# Patient Record
Sex: Female | Born: 1935 | Race: White | Hispanic: No | Smoking: Former smoker
Health system: Southern US, Community
[De-identification: ages and names within clinical notes are randomized; demographics above are authoritative.]

## PROBLEM LIST (undated history)

## (undated) DIAGNOSIS — F0390 Unspecified dementia without behavioral disturbance: Secondary | ICD-10-CM

## (undated) DIAGNOSIS — I119 Hypertensive heart disease without heart failure: Secondary | ICD-10-CM

## (undated) DIAGNOSIS — I2510 Atherosclerotic heart disease of native coronary artery without angina pectoris: Secondary | ICD-10-CM

## (undated) DIAGNOSIS — I739 Peripheral vascular disease, unspecified: Secondary | ICD-10-CM

## (undated) DIAGNOSIS — K5900 Constipation, unspecified: Secondary | ICD-10-CM

## (undated) DIAGNOSIS — K219 Gastro-esophageal reflux disease without esophagitis: Secondary | ICD-10-CM

## (undated) DIAGNOSIS — G629 Polyneuropathy, unspecified: Secondary | ICD-10-CM

## (undated) DIAGNOSIS — I5032 Chronic diastolic (congestive) heart failure: Secondary | ICD-10-CM

## (undated) DIAGNOSIS — E785 Hyperlipidemia, unspecified: Secondary | ICD-10-CM

## (undated) DIAGNOSIS — I359 Nonrheumatic aortic valve disorder, unspecified: Secondary | ICD-10-CM

## (undated) DIAGNOSIS — E119 Type 2 diabetes mellitus without complications: Secondary | ICD-10-CM

## (undated) HISTORY — PX: CORONARY ARTERY BYPASS GRAFT: SHX141

## (undated) HISTORY — PX: BREAST ENHANCEMENT SURGERY: SHX7

## (undated) HISTORY — PX: TONSILLECTOMY: SUR1361

## (undated) HISTORY — PX: OTHER SURGICAL HISTORY: SHX169

---

## 1998-01-02 ENCOUNTER — Inpatient Hospital Stay (HOSPITAL_COMMUNITY): Admission: RE | Admit: 1998-01-02 | Discharge: 1998-01-07 | Payer: Self-pay | Admitting: Cardiology

## 1998-02-09 ENCOUNTER — Encounter (HOSPITAL_COMMUNITY): Admission: RE | Admit: 1998-02-09 | Discharge: 1998-05-10 | Payer: Self-pay | Admitting: Cardiology

## 1998-04-21 ENCOUNTER — Encounter: Payer: Self-pay | Admitting: *Deleted

## 1998-04-21 ENCOUNTER — Inpatient Hospital Stay (HOSPITAL_COMMUNITY): Admission: RE | Admit: 1998-04-21 | Discharge: 1998-04-25 | Payer: Self-pay | Admitting: *Deleted

## 1998-05-25 ENCOUNTER — Encounter (HOSPITAL_COMMUNITY): Admission: RE | Admit: 1998-05-25 | Discharge: 1998-08-23 | Payer: Self-pay | Admitting: Cardiology

## 1999-02-01 ENCOUNTER — Other Ambulatory Visit: Admission: RE | Admit: 1999-02-01 | Discharge: 1999-02-01 | Payer: Self-pay | Admitting: Obstetrics and Gynecology

## 1999-05-23 HISTORY — PX: LAPAROSCOPIC CHOLECYSTECTOMY: SUR755

## 1999-08-20 ENCOUNTER — Ambulatory Visit (HOSPITAL_COMMUNITY): Admission: RE | Admit: 1999-08-20 | Discharge: 1999-08-20 | Payer: Self-pay | Admitting: Neurosurgery

## 1999-08-20 ENCOUNTER — Encounter: Payer: Self-pay | Admitting: Neurosurgery

## 1999-12-16 ENCOUNTER — Encounter: Payer: Self-pay | Admitting: Emergency Medicine

## 1999-12-16 ENCOUNTER — Encounter (INDEPENDENT_AMBULATORY_CARE_PROVIDER_SITE_OTHER): Payer: Self-pay | Admitting: *Deleted

## 1999-12-16 ENCOUNTER — Inpatient Hospital Stay (HOSPITAL_COMMUNITY): Admission: EM | Admit: 1999-12-16 | Discharge: 1999-12-23 | Payer: Self-pay | Admitting: Emergency Medicine

## 1999-12-21 ENCOUNTER — Encounter: Payer: Self-pay | Admitting: General Surgery

## 1999-12-26 ENCOUNTER — Encounter: Payer: Self-pay | Admitting: Surgery

## 1999-12-26 ENCOUNTER — Emergency Department (HOSPITAL_COMMUNITY): Admission: EM | Admit: 1999-12-26 | Discharge: 1999-12-26 | Payer: Self-pay | Admitting: Emergency Medicine

## 1999-12-29 ENCOUNTER — Encounter: Payer: Self-pay | Admitting: General Surgery

## 1999-12-29 ENCOUNTER — Ambulatory Visit (HOSPITAL_COMMUNITY): Admission: RE | Admit: 1999-12-29 | Discharge: 1999-12-29 | Payer: Self-pay | Admitting: General Surgery

## 2000-04-25 ENCOUNTER — Other Ambulatory Visit: Admission: RE | Admit: 2000-04-25 | Discharge: 2000-04-25 | Payer: Self-pay | Admitting: Obstetrics and Gynecology

## 2000-05-16 ENCOUNTER — Encounter: Payer: Self-pay | Admitting: Family Medicine

## 2000-05-16 ENCOUNTER — Encounter: Admission: RE | Admit: 2000-05-16 | Discharge: 2000-05-16 | Payer: Self-pay | Admitting: Family Medicine

## 2000-06-20 ENCOUNTER — Encounter: Admission: RE | Admit: 2000-06-20 | Discharge: 2000-09-18 | Payer: Self-pay | Admitting: Family Medicine

## 2001-04-23 ENCOUNTER — Other Ambulatory Visit: Admission: RE | Admit: 2001-04-23 | Discharge: 2001-04-23 | Payer: Self-pay | Admitting: Obstetrics and Gynecology

## 2001-04-24 ENCOUNTER — Encounter: Admission: RE | Admit: 2001-04-24 | Discharge: 2001-04-24 | Payer: Self-pay | Admitting: Family Medicine

## 2001-04-24 ENCOUNTER — Encounter: Payer: Self-pay | Admitting: Family Medicine

## 2001-05-01 ENCOUNTER — Emergency Department (HOSPITAL_COMMUNITY): Admission: EM | Admit: 2001-05-01 | Discharge: 2001-05-02 | Payer: Self-pay | Admitting: Emergency Medicine

## 2001-08-27 ENCOUNTER — Ambulatory Visit (HOSPITAL_COMMUNITY): Admission: RE | Admit: 2001-08-27 | Discharge: 2001-08-27 | Payer: Self-pay | Admitting: Gastroenterology

## 2001-10-03 ENCOUNTER — Encounter: Payer: Self-pay | Admitting: Family Medicine

## 2001-10-03 ENCOUNTER — Encounter: Admission: RE | Admit: 2001-10-03 | Discharge: 2001-10-03 | Payer: Self-pay | Admitting: Family Medicine

## 2001-12-20 HISTORY — PX: CAROTID ENDARTERECTOMY: SUR193

## 2001-12-25 ENCOUNTER — Ambulatory Visit (HOSPITAL_COMMUNITY): Admission: RE | Admit: 2001-12-25 | Discharge: 2001-12-25 | Payer: Self-pay | Admitting: Family Medicine

## 2001-12-25 ENCOUNTER — Encounter: Payer: Self-pay | Admitting: Family Medicine

## 2002-01-01 ENCOUNTER — Encounter: Admission: RE | Admit: 2002-01-01 | Discharge: 2002-01-01 | Payer: Self-pay | Admitting: Family Medicine

## 2002-01-01 ENCOUNTER — Encounter: Payer: Self-pay | Admitting: Family Medicine

## 2002-01-03 ENCOUNTER — Encounter: Payer: Self-pay | Admitting: *Deleted

## 2002-01-08 ENCOUNTER — Encounter: Payer: Self-pay | Admitting: Anesthesiology

## 2002-01-08 ENCOUNTER — Encounter (INDEPENDENT_AMBULATORY_CARE_PROVIDER_SITE_OTHER): Payer: Self-pay | Admitting: *Deleted

## 2002-01-08 ENCOUNTER — Inpatient Hospital Stay (HOSPITAL_COMMUNITY): Admission: RE | Admit: 2002-01-08 | Discharge: 2002-01-18 | Payer: Self-pay | Admitting: *Deleted

## 2002-01-09 ENCOUNTER — Encounter: Payer: Self-pay | Admitting: *Deleted

## 2002-01-10 ENCOUNTER — Encounter: Payer: Self-pay | Admitting: *Deleted

## 2002-01-16 ENCOUNTER — Encounter: Payer: Self-pay | Admitting: *Deleted

## 2002-07-08 ENCOUNTER — Ambulatory Visit (HOSPITAL_COMMUNITY): Admission: RE | Admit: 2002-07-08 | Discharge: 2002-07-08 | Payer: Self-pay | Admitting: Family Medicine

## 2002-07-08 ENCOUNTER — Encounter: Payer: Self-pay | Admitting: Family Medicine

## 2004-05-22 HISTORY — PX: OTHER SURGICAL HISTORY: SHX169

## 2004-06-23 ENCOUNTER — Encounter (INDEPENDENT_AMBULATORY_CARE_PROVIDER_SITE_OTHER): Payer: Self-pay | Admitting: Specialist

## 2004-06-23 ENCOUNTER — Ambulatory Visit (HOSPITAL_COMMUNITY): Admission: RE | Admit: 2004-06-23 | Discharge: 2004-06-23 | Payer: Self-pay | Admitting: Gastroenterology

## 2004-07-21 ENCOUNTER — Ambulatory Visit: Payer: Self-pay | Admitting: Physical Medicine & Rehabilitation

## 2004-07-21 ENCOUNTER — Inpatient Hospital Stay (HOSPITAL_COMMUNITY): Admission: RE | Admit: 2004-07-21 | Discharge: 2004-07-26 | Payer: Self-pay | Admitting: Cardiology

## 2004-07-26 ENCOUNTER — Inpatient Hospital Stay
Admission: RE | Admit: 2004-07-26 | Discharge: 2004-08-10 | Payer: Self-pay | Admitting: Physical Medicine & Rehabilitation

## 2004-08-23 ENCOUNTER — Encounter: Admission: RE | Admit: 2004-08-23 | Discharge: 2004-08-23 | Payer: Self-pay | Admitting: Vascular Surgery

## 2005-02-07 ENCOUNTER — Other Ambulatory Visit: Admission: RE | Admit: 2005-02-07 | Discharge: 2005-02-07 | Payer: Self-pay | Admitting: Gynecology

## 2005-08-22 ENCOUNTER — Encounter: Admission: RE | Admit: 2005-08-22 | Discharge: 2005-08-22 | Payer: Self-pay | Admitting: Gastroenterology

## 2006-01-23 ENCOUNTER — Encounter: Admission: RE | Admit: 2006-01-23 | Discharge: 2006-01-23 | Payer: Self-pay | Admitting: Family Medicine

## 2006-05-12 ENCOUNTER — Inpatient Hospital Stay (HOSPITAL_COMMUNITY): Admission: EM | Admit: 2006-05-12 | Discharge: 2006-05-23 | Payer: Self-pay | Admitting: Emergency Medicine

## 2006-05-14 ENCOUNTER — Encounter: Payer: Self-pay | Admitting: Cardiology

## 2006-07-26 ENCOUNTER — Encounter (HOSPITAL_COMMUNITY): Admission: RE | Admit: 2006-07-26 | Discharge: 2006-10-24 | Payer: Self-pay | Admitting: Cardiology

## 2006-09-14 ENCOUNTER — Inpatient Hospital Stay (HOSPITAL_COMMUNITY): Admission: EM | Admit: 2006-09-14 | Discharge: 2006-09-17 | Payer: Self-pay | Admitting: Emergency Medicine

## 2006-10-25 ENCOUNTER — Encounter (HOSPITAL_COMMUNITY): Admission: RE | Admit: 2006-10-25 | Discharge: 2006-11-10 | Payer: Self-pay | Admitting: Cardiology

## 2006-11-13 ENCOUNTER — Encounter (HOSPITAL_COMMUNITY): Admission: RE | Admit: 2006-11-13 | Discharge: 2007-02-11 | Payer: Self-pay | Admitting: Cardiology

## 2007-01-15 ENCOUNTER — Inpatient Hospital Stay (HOSPITAL_COMMUNITY): Admission: EM | Admit: 2007-01-15 | Discharge: 2007-01-17 | Payer: Self-pay | Admitting: Emergency Medicine

## 2007-02-13 ENCOUNTER — Ambulatory Visit: Payer: Self-pay | Admitting: Vascular Surgery

## 2007-02-20 ENCOUNTER — Encounter (HOSPITAL_COMMUNITY): Admission: RE | Admit: 2007-02-20 | Discharge: 2007-05-21 | Payer: Self-pay | Admitting: Cardiology

## 2007-05-23 ENCOUNTER — Encounter (HOSPITAL_COMMUNITY): Admission: RE | Admit: 2007-05-23 | Discharge: 2007-08-21 | Payer: Self-pay | Admitting: Cardiology

## 2007-06-25 ENCOUNTER — Emergency Department (HOSPITAL_COMMUNITY): Admission: EM | Admit: 2007-06-25 | Discharge: 2007-06-25 | Payer: Self-pay | Admitting: Emergency Medicine

## 2007-08-22 ENCOUNTER — Encounter (HOSPITAL_COMMUNITY): Admission: RE | Admit: 2007-08-22 | Discharge: 2007-11-20 | Payer: Self-pay | Admitting: Cardiology

## 2007-10-09 ENCOUNTER — Observation Stay (HOSPITAL_COMMUNITY): Admission: EM | Admit: 2007-10-09 | Discharge: 2007-10-10 | Payer: Self-pay | Admitting: Emergency Medicine

## 2007-11-21 ENCOUNTER — Encounter (HOSPITAL_COMMUNITY): Admission: RE | Admit: 2007-11-21 | Discharge: 2008-02-19 | Payer: Self-pay | Admitting: Cardiology

## 2008-02-20 ENCOUNTER — Encounter (HOSPITAL_COMMUNITY): Admission: RE | Admit: 2008-02-20 | Discharge: 2008-05-20 | Payer: Self-pay | Admitting: Cardiology

## 2008-03-05 ENCOUNTER — Ambulatory Visit: Payer: Self-pay | Admitting: *Deleted

## 2008-05-22 ENCOUNTER — Encounter (HOSPITAL_COMMUNITY): Admission: RE | Admit: 2008-05-22 | Discharge: 2008-08-20 | Payer: Self-pay | Admitting: Cardiology

## 2008-08-21 ENCOUNTER — Encounter (HOSPITAL_COMMUNITY): Admission: RE | Admit: 2008-08-21 | Discharge: 2008-11-19 | Payer: Self-pay | Admitting: Cardiology

## 2008-11-20 ENCOUNTER — Encounter (HOSPITAL_COMMUNITY): Admission: RE | Admit: 2008-11-20 | Discharge: 2009-02-18 | Payer: Self-pay | Admitting: Cardiology

## 2008-12-16 ENCOUNTER — Ambulatory Visit: Payer: Self-pay | Admitting: Cardiology

## 2008-12-16 ENCOUNTER — Inpatient Hospital Stay (HOSPITAL_COMMUNITY): Admission: EM | Admit: 2008-12-16 | Discharge: 2008-12-19 | Payer: Self-pay | Admitting: Emergency Medicine

## 2009-01-06 ENCOUNTER — Inpatient Hospital Stay (HOSPITAL_COMMUNITY): Admission: EM | Admit: 2009-01-06 | Discharge: 2009-01-08 | Payer: Self-pay | Admitting: Emergency Medicine

## 2009-01-06 ENCOUNTER — Ambulatory Visit: Payer: Self-pay | Admitting: Cardiovascular Disease

## 2009-02-08 ENCOUNTER — Ambulatory Visit: Payer: Self-pay | Admitting: Vascular Surgery

## 2009-02-19 ENCOUNTER — Encounter: Admission: RE | Admit: 2009-02-19 | Discharge: 2009-05-20 | Payer: Self-pay | Admitting: Cardiology

## 2009-05-22 ENCOUNTER — Encounter (HOSPITAL_COMMUNITY): Admission: RE | Admit: 2009-05-22 | Discharge: 2009-08-20 | Payer: Self-pay | Admitting: Cardiology

## 2009-08-21 ENCOUNTER — Encounter (HOSPITAL_COMMUNITY): Admission: RE | Admit: 2009-08-21 | Discharge: 2009-11-19 | Payer: Self-pay | Admitting: Cardiology

## 2009-11-20 ENCOUNTER — Encounter (HOSPITAL_COMMUNITY): Admission: RE | Admit: 2009-11-20 | Discharge: 2010-02-18 | Payer: Self-pay | Admitting: Cardiology

## 2010-02-19 ENCOUNTER — Encounter (HOSPITAL_COMMUNITY): Admission: RE | Admit: 2010-02-19 | Discharge: 2010-04-20 | Payer: Self-pay | Admitting: Cardiology

## 2010-04-29 ENCOUNTER — Ambulatory Visit: Payer: Self-pay

## 2010-04-29 ENCOUNTER — Encounter
Admission: RE | Admit: 2010-04-29 | Discharge: 2010-04-29 | Payer: Self-pay | Source: Home / Self Care | Attending: Gastroenterology | Admitting: Gastroenterology

## 2010-05-24 ENCOUNTER — Emergency Department (HOSPITAL_COMMUNITY)
Admission: EM | Admit: 2010-05-24 | Discharge: 2010-05-24 | Payer: Self-pay | Source: Home / Self Care | Admitting: Emergency Medicine

## 2010-08-01 LAB — COMPREHENSIVE METABOLIC PANEL
ALT: 14 U/L (ref 0–35)
AST: 20 U/L (ref 0–37)
Albumin: 4 g/dL (ref 3.5–5.2)
Alkaline Phosphatase: 52 U/L (ref 39–117)
BUN: 19 mg/dL (ref 6–23)
CO2: 26 mEq/L (ref 19–32)
Calcium: 9.5 mg/dL (ref 8.4–10.5)
Chloride: 106 mEq/L (ref 96–112)
Creatinine, Ser: 1.11 mg/dL (ref 0.4–1.2)
GFR calc Af Amer: 57 mL/min — ABNORMAL LOW (ref >60)
GFR calc non Af Amer: 47 mL/min — ABNORMAL LOW (ref >60)
Glucose, Bld: 132 mg/dL — ABNORMAL HIGH (ref 70–99)
Potassium: 4.6 mEq/L (ref 3.5–5.1)
Sodium: 137 mEq/L (ref 135–145)
Total Bilirubin: 0.7 mg/dL (ref 0.3–1.2)
Total Protein: 6.7 g/dL (ref 6.0–8.3)

## 2010-08-01 LAB — CBC
HCT: 33.3 % — ABNORMAL LOW (ref 36.0–46.0)
Hemoglobin: 11 g/dL — ABNORMAL LOW (ref 12.0–15.0)
MCH: 30.6 pg (ref 26.0–34.0)
MCHC: 33 g/dL (ref 30.0–36.0)
MCV: 92.8 fL (ref 78.0–100.0)
Platelets: 218 10*3/uL (ref 150–400)
RBC: 3.59 MIL/uL — ABNORMAL LOW (ref 3.87–5.11)
RDW: 12.2 % (ref 11.5–15.5)
WBC: 6.1 10*3/uL (ref 4.0–10.5)

## 2010-08-01 LAB — POCT CARDIAC MARKERS
CKMB, poc: 1 ng/mL (ref 1.0–8.0)
Myoglobin, poc: 61.7 ng/mL (ref 12–200)
Troponin i, poc: <0.05 ng/mL (ref 0.00–0.09)

## 2010-08-01 LAB — POCT I-STAT, CHEM 8
BUN: 20 mg/dL (ref 6–23)
Calcium, Ion: 1.22 mmol/L (ref 1.12–1.32)
Chloride: 107 mEq/L (ref 96–112)
Creatinine, Ser: 1.3 mg/dL — ABNORMAL HIGH (ref 0.4–1.2)
Glucose, Bld: 127 mg/dL — ABNORMAL HIGH (ref 70–99)
HCT: 33 % — ABNORMAL LOW (ref 36.0–46.0)
Hemoglobin: 11.2 g/dL — ABNORMAL LOW (ref 12.0–15.0)
Potassium: 4.6 mEq/L (ref 3.5–5.1)
Sodium: 138 mEq/L (ref 135–145)
TCO2: 25 mmol/L (ref 0–100)

## 2010-08-01 LAB — BRAIN NATRIURETIC PEPTIDE: Pro B Natriuretic peptide (BNP): 510 pg/mL — ABNORMAL HIGH (ref 0.0–100.0)

## 2010-08-04 LAB — GLUCOSE, CAPILLARY: Glucose-Capillary: 124 mg/dL — ABNORMAL HIGH (ref 70–99)

## 2010-08-15 LAB — GLUCOSE, CAPILLARY
Glucose-Capillary: 108 mg/dL — ABNORMAL HIGH (ref 70–99)
Glucose-Capillary: 61 mg/dL — ABNORMAL LOW (ref 70–99)

## 2010-08-24 LAB — GLUCOSE, CAPILLARY: Glucose-Capillary: 100 mg/dL — ABNORMAL HIGH (ref 70–99)

## 2010-08-27 LAB — CARDIAC PANEL(CRET KIN+CKTOT+MB+TROPI)
CK, MB: 4.7 ng/mL — ABNORMAL HIGH (ref 0.3–4.0)
Relative Index: INVALID (ref 0.0–2.5)
Total CK: 74 U/L (ref 7–177)
Total CK: 80 U/L (ref 7–177)

## 2010-08-27 LAB — DIFFERENTIAL
Basophils Absolute: 0 10*3/uL (ref 0.0–0.1)
Basophils Absolute: 0 10*3/uL (ref 0.0–0.1)
Basophils Relative: 0 % (ref 0–1)
Eosinophils Absolute: 0.2 10*3/uL (ref 0.0–0.7)
Eosinophils Relative: 2 % (ref 0–5)
Lymphocytes Relative: 19 % (ref 12–46)
Monocytes Absolute: 0.6 10*3/uL (ref 0.1–1.0)
Neutro Abs: 6.4 10*3/uL (ref 1.7–7.7)
Neutrophils Relative %: 73 % (ref 43–77)

## 2010-08-27 LAB — POCT I-STAT, CHEM 8
BUN: 24 mg/dL — ABNORMAL HIGH (ref 6–23)
Chloride: 104 mEq/L (ref 96–112)
Creatinine, Ser: 1.2 mg/dL (ref 0.4–1.2)
Potassium: 3.7 mEq/L (ref 3.5–5.1)
Sodium: 142 mEq/L (ref 135–145)
TCO2: 26 mmol/L (ref 0–100)

## 2010-08-27 LAB — BASIC METABOLIC PANEL
BUN: 15 mg/dL (ref 6–23)
Calcium: 9.1 mg/dL (ref 8.4–10.5)
Calcium: 9.6 mg/dL (ref 8.4–10.5)
Chloride: 103 mEq/L (ref 96–112)
Creatinine, Ser: 1.1 mg/dL (ref 0.4–1.2)
Creatinine, Ser: 1.16 mg/dL (ref 0.4–1.2)
GFR calc Af Amer: 55 mL/min — ABNORMAL LOW (ref >60)
GFR calc non Af Amer: 45 mL/min — ABNORMAL LOW (ref >60)
Sodium: 143 mEq/L (ref 135–145)

## 2010-08-27 LAB — CBC
HCT: 37 % (ref 36.0–46.0)
Hemoglobin: 12.6 g/dL (ref 12.0–15.0)
MCHC: 34 g/dL (ref 30.0–36.0)
RBC: 4.05 MIL/uL (ref 3.87–5.11)
RDW: 12.7 % (ref 11.5–15.5)
WBC: 8.8 10*3/uL (ref 4.0–10.5)

## 2010-08-27 LAB — BRAIN NATRIURETIC PEPTIDE: Pro B Natriuretic peptide (BNP): 263 pg/mL — ABNORMAL HIGH (ref 0.0–100.0)

## 2010-08-27 LAB — POCT CARDIAC MARKERS
CKMB, poc: <1 ng/mL — ABNORMAL LOW (ref 1.0–8.0)
Myoglobin, poc: 49.4 ng/mL (ref 12–200)
Troponin i, poc: <0.05 ng/mL (ref 0.00–0.09)

## 2010-08-27 LAB — CK TOTAL AND CKMB (NOT AT ARMC)
CK, MB: 1.9 ng/mL (ref 0.3–4.0)
Relative Index: INVALID (ref 0.0–2.5)
Total CK: 75 U/L (ref 7–177)

## 2010-08-28 LAB — DIFFERENTIAL
Basophils Relative: 0 % (ref 0–1)
Eosinophils Absolute: 0.2 10*3/uL (ref 0.0–0.7)
Eosinophils Absolute: 0.3 10*3/uL (ref 0.0–0.7)
Eosinophils Relative: 3 % (ref 0–5)
Lymphocytes Relative: 27 % (ref 12–46)
Lymphs Abs: 1.3 10*3/uL (ref 0.7–4.0)
Lymphs Abs: 2 10*3/uL (ref 0.7–4.0)
Monocytes Absolute: 0.8 10*3/uL (ref 0.1–1.0)
Monocytes Relative: 11 % (ref 3–12)
Neutrophils Relative %: 67 % (ref 43–77)

## 2010-08-28 LAB — COMPREHENSIVE METABOLIC PANEL
Albumin: 4.1 g/dL (ref 3.5–5.2)
Alkaline Phosphatase: 53 U/L (ref 39–117)
BUN: 21 mg/dL (ref 6–23)
Chloride: 101 mEq/L (ref 96–112)
Glucose, Bld: 95 mg/dL (ref 70–99)
Potassium: 3.7 mEq/L (ref 3.5–5.1)
Total Bilirubin: 0.8 mg/dL (ref 0.3–1.2)

## 2010-08-28 LAB — BASIC METABOLIC PANEL
BUN: 18 mg/dL (ref 6–23)
BUN: 22 mg/dL (ref 6–23)
CO2: 27 mEq/L (ref 19–32)
Calcium: 9.3 mg/dL (ref 8.4–10.5)
Chloride: 102 mEq/L (ref 96–112)
Creatinine, Ser: 0.94 mg/dL (ref 0.4–1.2)
Glucose, Bld: 80 mg/dL (ref 70–99)
Glucose, Bld: 95 mg/dL (ref 70–99)
Potassium: 3.8 mEq/L (ref 3.5–5.1)
Sodium: 135 mEq/L (ref 135–145)

## 2010-08-28 LAB — GLUCOSE, CAPILLARY
Glucose-Capillary: 116 mg/dL — ABNORMAL HIGH (ref 70–99)
Glucose-Capillary: 125 mg/dL — ABNORMAL HIGH (ref 70–99)
Glucose-Capillary: 131 mg/dL — ABNORMAL HIGH (ref 70–99)
Glucose-Capillary: 132 mg/dL — ABNORMAL HIGH (ref 70–99)
Glucose-Capillary: 134 mg/dL — ABNORMAL HIGH (ref 70–99)
Glucose-Capillary: 92 mg/dL (ref 70–99)

## 2010-08-28 LAB — CBC
HCT: 37.4 % (ref 36.0–46.0)
Hemoglobin: 12.8 g/dL (ref 12.0–15.0)
MCHC: 34.8 g/dL (ref 30.0–36.0)
MCV: 89 fL (ref 78.0–100.0)
MCV: 89.6 fL (ref 78.0–100.0)
Platelets: 207 10*3/uL (ref 150–400)
WBC: 6.2 10*3/uL (ref 4.0–10.5)
WBC: 7.6 10*3/uL (ref 4.0–10.5)

## 2010-08-28 LAB — POCT CARDIAC MARKERS
CKMB, poc: 1.5 ng/mL (ref 1.0–8.0)
Myoglobin, poc: 99 ng/mL (ref 12–200)

## 2010-08-28 LAB — CARDIAC PANEL(CRET KIN+CKTOT+MB+TROPI)
CK, MB: 2.8 ng/mL (ref 0.3–4.0)
CK, MB: 3.4 ng/mL (ref 0.3–4.0)
Relative Index: 2.8 — ABNORMAL HIGH (ref 0.0–2.5)

## 2010-08-28 LAB — POCT I-STAT, CHEM 8
HCT: 36 % (ref 36.0–46.0)
Hemoglobin: 12.2 g/dL (ref 12.0–15.0)
Potassium: 3.6 mEq/L (ref 3.5–5.1)
Sodium: 138 mEq/L (ref 135–145)

## 2010-08-28 LAB — BRAIN NATRIURETIC PEPTIDE: Pro B Natriuretic peptide (BNP): 562 pg/mL — ABNORMAL HIGH (ref 0.0–100.0)

## 2010-08-28 LAB — TSH: TSH: 2.04 u[IU]/mL (ref 0.350–4.500)

## 2010-08-28 LAB — PROTIME-INR: Prothrombin Time: 12.2 seconds (ref 11.6–15.2)

## 2010-08-28 LAB — MAGNESIUM: Magnesium: 2.3 mg/dL (ref 1.5–2.5)

## 2010-09-01 LAB — GLUCOSE, CAPILLARY: Glucose-Capillary: 50 mg/dL — ABNORMAL LOW (ref 70–99)

## 2010-10-04 NOTE — H&P (Signed)
[name], [name]            ACCOUNT NO.:  [id number]   MEDICAL RECORD NO.:  [id number]          PATIENT TYPE:  OBV   LOCATION:  5527                         FACILITY:  [facility]   PHYSICIAN:  Corinna L. Lendell Caprice, MDDATE OF BIRTH:  Dec 15, 1926   DATE OF ADMISSION:  10/09/2007  DATE OF DISCHARGE:                              HISTORY & PHYSICAL   CHIEF COMPLAINT:  Took too much medication by accident.   HISTORY OF PRESENT ILLNESS:  Ms. [name] is a 77 year old white female  who is a somewhat difficult historian, but reportedly does remember  doubling all her medications today.  She usually separates her  medication in a medication planner, but took an extra dose of all of her  medications today by accident.  Currently, she is feeling weak, but  cannot really qualify further.  She denies any chest pain or shortness  of breath.  Dr. Devoria Albe called [facility] who recommended  glucagon, calcium, and charcoal, which the patient has received there.  She also received a bolus of IV fluids.  She also is complaining of  occasional dysphasia.  She reports that food feels like it is getting  stuck.  She reports having told Dr. Arvilla Market in the past.  She also  reports that she had upper and lower endoscopy recently and was told it  was normal..   PAST MEDICAL HISTORY:  1. Coronary artery disease.  2. Diabetes.  3. Probable mild dementia.  4. History of diastolic dysfunction.  5. Gastroesophageal reflux disease.  6. Hypertension.   MEDICATIONS:  Cozaar, Toprol XL, hydralazine, Amaryl, Plavix, Vytorin  10/40, Lasix, aspirin, Imdur, gabapentin, and trazodone; doses are not  known, but per previous history and physical:  1. Aspirin 81 mg a day.  2. Gabapentin 300 mg twice a day.  3. Imdur 60 mg a day.  4. Lasix 40 mg a day.  5. Amaryl 2 mg a day.  6. Toprol XL 50 mg a day.  7. Cozaar 50 mg a day.  8. Trazodone 50 mg nightly.   SOCIAL HISTORY:  The patient lives alone.  She denies  current drinking  or smoking.   FAMILY HISTORY:  Noncontributory.   REVIEW OF SYSTEMS:  As above, otherwise negative.   PHYSICAL EXAMINATION:  VITALS:  Temperature is 98.3, blood pressure  111/57, pulse 67, but has dropped down to 51 transiently, respiratory  rate 18, and oxygen saturation 99%.  GENERAL:  The patient is a thin white female who appears tired.  HEENT:  Normocephalic and atraumatic.  Pupils equal, round, and reactive  to light.  She has slightly dry mucous membranes and charcoal on her  tongue and lips.  NECK:  Supple.  She has a carotid endarterectomy scar on the right.  LUNGS:  Clear to auscultation bilaterally without wheezes, rhonchi, or  rales.  CARDIOVASCULAR:  Regular rate and rhythm without murmurs, gallops, or  rubs.  ABDOMEN:  Normal bowel sounds, soft, nontender, and nondistended.  GU and RECTAL:  Deferred.  EXTREMITIES:  No clubbing, cyanosis, or edema.  SKIN:  No rash.  PSYCHIATRIC:  The patient is calm and  cooperative.  NEUROLOGIC:  She is slightly sleepy.  She is oriented to person and  place, but quite forgetful.  Sensorimotor exam are intact.  I did not  test gait.   LABORATORY DATA:  Calcium is 1.18.  CBC unremarkable.  Basic metabolic  panel significant for glucose of 126, BUN of 24.  EKG was reportedly  done, but I do not see it on the chart.   ASSESSMENT AND PLAN:  1. Accidental overdose of medications.  The patient will be monitored      on telemetry for hypotension, hypoglycemia, or bradycardia.  She      will get IV fluids.  I will hold her medications.  2. Dysphasia.  I will get an esophagram.  Apparently this has been a      chronic complaint for her.  3. Diabetes.  4. Heart disease.  5. History of diastolic dysfunction.  6. Dementia.  7. Hypertension.      Corinna L. Lendell Caprice, MD  Electronically Signed     CLS/MEDQ  D:  10/09/2007  T:  10/10/2007  Job:  [id number]   cc:   Donia Guiles, M.D.  Georga Hacking, M.D.  John  C. Madilyn Fireman, M.D.

## 2010-10-04 NOTE — H&P (Signed)
[name], [name]            ACCOUNT NO.:  [id number]   MEDICAL RECORD NO.:  [id number]          PATIENT TYPE:  INP   LOCATION:  2911                         FACILITY:  [facility]   PHYSICIAN:  Darryl D. Prime, MD    DATE OF BIRTH:  Nov 04, 1926   DATE OF ADMISSION:  12/16/2008  DATE OF DISCHARGE:                              HISTORY & PHYSICAL   PRIMARY CARE PHYSICIAN:  Donia Guiles, M.D.   CARDIOLOGIST:  Georga Hacking, M.D.   The patient is full code.   CHIEF COMPLAINT:  Chest pain, back pain.   HISTORY OF PRESENT ILLNESS:  [name] is a 77 year old female with  a history of coronary artery disease, status post coronary artery bypass  grafting in 1981, 1982, 1987, and 1999.  Last cardiac catheterization  showed in December 2007 LV gram, EF of 65% with anterolateral akinesis.  Left main was 50-60% stenosed with previous stent implantation, LAD  completely occluded midportion.  Circumflex small OM branch, first  branch 50% stenosis, second branch was occluded, third marginal, patent,  fourth marginal patent.  The distal circumflex was patent.  Right  coronary artery was highly diseased and mid segment 50% tubular distal  segment just before the origin of the posterior lateral, subtotal  stenosis, LIMA to LAD patent.  Large conduit to mid LAD.  The patient  had retrograde filling to a small diagonal, small septal perforator.  The patient's RCA lesion  was not intervened on medical management.  The  patient has a history of per medical records congestive heart failure  with preserved ejection fraction, diabetes, peripheral vascular disease,  hypertension, gastroesophageal reflux disease, and she presents today  with back pain and chest pain.  The patient does go to maintenance  cardiac rehab and was there today to start rehab, but her blood  pressures were in the range of 190 systolic and she was told to go home.  The patient apparently at home had severe back pain with  radiation to  left side of the chest, sharp, nonpleuritic with no associated shortness  of breath and sweats.  She is unsure of what time this happened and  called 911.  Apparently EMS gave her aspirin and sublingual nitro x1.  In the emergency room her systolic blood pressure was 212/72.  She was  placed on a nitro drip and her blood pressure did decrease periodically  but since has returned back up.  Systolic blood pressures again in the  200s range.  The patient at the time of interview noted significant  chest pain but the back pain was mild.   PAST MEDICAL/SURGICAL HISTORY:  As above.  1. She has a history of TIA.  2. She has a history of a fifth right femoral artery stenosis to the      closure of a lymphocele, November of 1999.  3. She has a history  of status post hysterectomy.  4. History of a hiatal hernia.  5. She is status post left-sided cholecystectomy in 2001.  6. She has a history of right carotid endarterectomy with Dacron patch  angioplasty.  7. 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  8. History of mild dementia.   ALLERGIES:  1. She is allergic to CEPHALOSPORINS  2. CLINDAMYCIN.   MEDICATIONS:  1. She is on gabapentin 300 mg twice a day.  2. Hydralazine 50 mg twice a day.  3. Plavix 75 mg daily.  4. Toprol XL 50 mg once a day.  5. Cozaar one tablet twice a day 50 mg tabs.  6. She is on isosorbide dinitrate, I believe, two tablets at a time a      day.  7. She is on Vytorin 10/40 daily.  8. She is on Lasix 20 mg twice a day.  9. Amaryl 2 mg tablets 1-1/2 a day.  10.Aspirin 81 mg daily.  11.Prilosec two tablets a day, unsure of the exact dose.  12.Citalopram. unsure of the exact dose daily.   SOCIAL HISTORY:  She lives alone.  She is widowed.  She has two  children.  She has never used alcohol.  History of tobacco abuse, one  pack a day for about 10 years, but quit in 1991.   FAMILY HISTORY:  Mother  died at 64 secondary to gallbladder disease.  Father died at an early age with pneumonia.  There is significant  coronary artery disease in her siblings.  Apparently all are deceased  secondary to coronary artery disease.   REVIEW OF SYSTEMS:  A 14-point review of systems negative unless stated  above.  She specifically denies any dysuria, cough, or fever.   PHYSICAL EXAMINATION:  VITAL SIGNS:  Blood pressure equal bilateral to  both arms 198/75 now, pulse of 53, respiratory rate of 16 and sats 100%  on 2 L nasal cannula.  Temperature 98.4.  GENERAL:  She is a well-nourished, well-developed white female in no  acute distress.  HEENT: Normocephalic, atraumatic.  Pupils are equal, round and reactive  to light.  Extraocular movements intact.  Fundi not well visualized.  Ears, nose and throat grossly within normal limits.  NECK:  Shows no bruits, no jugular venous distention.  No carotid  bruits.  No lymphadenopathy, no thyromegaly.  LUNGS:  Clear to auscultation bilaterally.  HEART:  Reveals normal S1/S2 and S4 is present.  There are no murmurs,  rubs or gallops.  Sternotomy incision is well-healed.  ABDOMEN:  Soft, nontender, nondistended with normal active bowel sounds.  No hepatosplenomegaly.  Well-healed midline incision below the  umbilicus.  EXTREMITIES:  Show no clubbing, cyanosis or edema.  Vascular exam  reveals pulses 2+ in no carotid, radial, and brachial pulses.  Femoral  are 1+ on the right, 2+ on the left, 1+ bilaterally for the popliteal,  dorsalis pedis and posterior tibial were 2+ bilaterally.  NEUROLOGIC:  The patient has deficits to short-term memory but she is  alert and oriented x3 with cranial nerves II-XII grossly intact.  Strength and sensation grossly intact.  SKIN:  Shows no rashes or ulcers.  MUSCULOSKELETAL:  Reveals full range of motion in all joints.  No signs  of synovitis.  PSYCHIATRIC:  Reveals appropriate mood and affect.   LABORATORY DATA:  On  I-Stat she has a sodium of 138, potassium 3.6,  chloride 102, bicarb 26, BUN 25, creatinine 1.1 with a glucose of 122.  The patient's white count is 6.2 with hemoglobin of 12.4, hematocrit  35.7, platelets 205, segs of 67%.   Chest x-ray showed no acute  cardiopulmonary disease and the mediastinum  was not wide.  No change compared to June 25, 2007.  Chest x-ray:  She has bilateral breast implants.  EKG showed normal sinus rhythm with  a vent rate of 64 beats per minute, PR interval 164, QRS 86, QT  corrected 475 with LVH.  No major change compared to EKG May of 2009.   ASSESSMENT/PLAN:  This is a patient with a history of significant  coronary artery disease, status post bypass grafting on four separate  surgeries.  She has known severe right coronary artery disease.  She  presents with chest pain, back pain,  concerned for acute coronary  syndrome plus or minus dissection.  At this time will have her get a CT  chest angio to rule out dissection.  If negative she will consider  heparin for acute coronary syndrome.  Will continue on aspirin and  Plavix.  Aspirin will be at 325 mg a day.  She will be in the CCU.  We  will give nitroglycerin to control her blood pressure and to help  relieve her possible anginal symptoms sublingual, then high-dose drip.  If this does not control it we will consider nitroprusside plus or minus  hydralazine IV.  The patient's GI and DVT prophylaxis will be ordered.  Will continue her statin therapy.  Blood sugars will be controlled  closely.  We will hold on insulin for now, however, she will be n.p.o.  after midnight for possible further cardiovascular studies.      Darryl D. Prime, MD  Electronically Signed     DDP/MEDQ  D:  12/16/2008  T:  12/17/2008  Job:  [id number]

## 2010-10-04 NOTE — H&P (Signed)
[name], [name]            ACCOUNT NO.:  [id number]   MEDICAL RECORD NO.:  [id number]          PATIENT TYPE:  INP   LOCATION:  2609                         FACILITY:  [facility]   PHYSICIAN:  Brayton El, MD    DATE OF BIRTH:  01-07-1927   DATE OF ADMISSION:  01/06/2009  DATE OF DISCHARGE:                              HISTORY & PHYSICAL   CHIEF COMPLAINT:  Hypertension.   HISTORY OF PRESENT ILLNESS:  The patient is a 77 year old white female  with past medical history significant for extensive coronary artery  disease, peripheral arterial disease, malignant hypertension, TIA, who  is presenting with a systolic blood pressure greater than 230 at home.  The patient has difficulty recalling short-term events and therefore is  not sure whether she was symptomatic or not.  The patient states that  she routinely checks her blood pressure and noted the systolic blood  pressure was very elevated.  She reported here to the emergency room.  Upon arrival to the emergency room the patient's blood pressure was  213/81, and she was complaining of back pain and intermittent chest  discomfort.  The patient states that she has been in her normal state of  health.  She attends cardiac rehabilitation three times a week and has  no problem with this.  She does state that she thinks she may have been  more short of breath, however, over the past 2 weeks, but cannot  describe the circumstances of the shortness of breath.  The patient's  blood pressure was lowered in the emergency room with a nitroglycerin  drip to 180/72.  The patient is currently still complaining of a slight  amount of back pain, but denies any chest discomfort.   PAST MEDICAL HISTORY:  1. Coronary artery disease, status post CABG x4.  Last heart      catheterization was in December 2007, showed an ejection fraction      of 65%, left main 50-60% stenotic, LAD was completely occluded, the      circumflex had a small OM branch,  first branch 50% stenosis, second      branch was occluded, third marginal patent, fourth marginal patent,      distal circ was patent.  The right coronary artery was highly      diseased with a mid segment 50% tubular distal segment just before      the origin of the posterolateral, there was subtotal stenosis.      LIMA to the LAD was patent.  Right coronary artery was not      intervened on and medical management was instead selected.  2. History of right carotid endarterectomy.  3. History of left external iliac pseudoaneurysm in March 2006.  4. History of right femoral stenosis.   SOCIAL HISTORY:  No current tobacco or alcohol.   FAMILY HISTORY:  Significant for coronary disease.   ALLERGIES:  1. CLINDAMYCIN.  2. CEPHALOSPORINS.   MEDICATIONS:  1. Aspirin 81 mg daily.  2. Plavix 75 mg daily.  3. Toprol XL 50 mg daily.  4. Cozaar 50 mg; it is not clear whether  she takes this once or twice      a day.  5. Hydralazine 50 mg; it is not clear whether she takes this two or      three times a day.  6. Vytorin 10/40 daily.  7. Lasix 20 mg b.i.d.  8. Amaryl 30 mg daily.  9. Prilosec 2 tablets daily.   REVIEW OF SYSTEMS:  As in HPI.  The patient states that she takes her  medications routinely.  She denies any headache, dizziness or syncope.  Other systems as in HPI, otherwise negative.   PHYSICAL EXAMINATION:  VITAL SIGNS:  Temperature 98.1, blood pressure  currently 180/72, pulse 77, respirations 16.  GENERAL:  She is in no  acute distress.  HEENT:  Nonfocal.  NECK:  Supple.  HEART:  Regular rate and rhythm with a 1/6 holosystolic murmur heard  loudest at the left sternal border.  LUNGS:  Clear bilaterally.  ABDOMEN:  Soft, nontender, nondistended.  EXTREMITIES:  Without edema.  NEURO:  Nonfocal.  PSYCHIATRIC:  The patient is appropriate, but has issues with short-term  memory.   Chest x-ray shows faint air space opacity of the left lung base which  may represent  artifact.  EKG independently read by myself demonstrates  normal sinus rhythm with 1 mm horizontal ST-segment depression in the  inferior leads and 0.5-1 mm ST-segment depression in the lateral leads.  This EKG was taken when her blood pressure was more elevated than it  currently is.   LABORATORY DATA:  Sodium 142, potassium 3.7, chloride 104, CO2 of 26,  BUN 24, creatinine 1.2, glucose 95, CK-MB less than 1.0, troponin less  than 0.05, INR 0.9, BNP 263.   ASSESSMENT:  A 77 year old white female with extensive coronary disease  and malignant hypertension presenting with hypertensive emergency.  Because the patient is having back pain there is some concern for aortic  dissection, however, it should be noted that the patient's presentation  was very similar approximately 1 month ago and a CT angiogram at that  time ruled out dissection.  The patient does have some EKG changes,  however, this may be secondary to a combination of left ventricular  hypertrophy and poorly controlled hypertension.   PLAN:  She will be admitted to a step-down unit and continued on a  nitroglycerin drip for now for blood pressure control.  We will obtain a  CT scan of the chest to rule out dissection.  She will be ruled out for  myocardial infarction.  A renal artery ultrasound should be considered  in order to investigate for renal artery stenosis which may be the  source of these paroxysms of hypertension.  It is likely that her blood  pressure medicines will likely need to be titrated up, however, for now  we will continue her on the nitroglycerin drip and follow the trends of  her blood pressure.  She will be placed on Lovenox 40 mg subcu daily,  which of course will be held if she does indeed have a dissection.  Other medicines as listed above will be continued.      Brayton El, MD  Electronically Signed     SGA/MEDQ  D:  01/06/2009  T:  01/06/2009  Job:  [id number]

## 2010-10-04 NOTE — Procedures (Signed)
CAROTID DUPLEX EXAM   INDICATION:  Patient complains of increased swallowing difficulty with a  13 pound weight loss in the last few months.   HISTORY:  Diabetes:  Yes, orally controlled.  Cardiac:  Yes.  Hypertension:  Yes  Smoking:  No  Previous Surgery:  CABG, December, 1981 by Dr. Nedra Hai.  Right CEA with DPA  August, 2003 by Dr. Madilyn Fireman.  CV History:  The patient had TIA in 1993 during back surgery.  Amaurosis Fugax No, Paresthesias No, Hemiparesis No.   INTERPRETING PHYSICIAN:  Balinda Quails, M.D.                                       RIGHT             LEFT  Brachial systolic pressure:         142               142  Brachial Doppler waveforms:         Within normal limits.               Within normal limits.  Vertebral direction of flow:        Antegrade.        Antegrade.  DUPLEX VELOCITIES (cm/sec)  CCA peak systolic                   82                82  ECA peak systolic                   58                124  ICA peak systolic                   70                70  ICA end diastolic                   23                17  PLAQUE MORPHOLOGY:                  Homogeneous.      Irregular, mixed.  PLAQUE AMOUNT:                      Mild.             Mild.  PLAQUE LOCATION:                    P/M CCA, ICA      ICA/ECA   IMPRESSION:  Right ICA status post carotid endarterectomy, without  significant recurrent stenosis with minimal plaquing.  Left ICA 1-39% stenosis.  Right proximal-mid CCA with less than 50% stenosis.  Left ECA with mild stenosis.  Bilateral vertebral arteries demonstrate normal antegrade flow.   Dr. Roselie Skinner nurse was given a preliminary report following the Duplex  on February 13, 2007.   __________________________________________  P. Bud Face, M.D.   PB/MEDQ  D:  02/13/2007  T:  02/14/2007  Job:  [id number]

## 2010-10-04 NOTE — Discharge Summary (Signed)
[name], [name]            ACCOUNT NO.:  [id number]   MEDICAL RECORD NO.:  [id number]          PATIENT TYPE:  INP   LOCATION:  4740                         FACILITY:  [facility]   PHYSICIAN:  Georga Hacking, M.D.DATE OF BIRTH:  22-Dec-1926   DATE OF ADMISSION:  01/06/2009  DATE OF DISCHARGE:  01/08/2009                               DISCHARGE SUMMARY   FINAL DIAGNOSES:  1. Accelerated hypertension.  2. Coronary artery disease with multiple previous bypass operations      and percutaneous interventions.  3. Short term memory loss.  4. Severe peripheral vascular disease with calcified ostium of renal      arteries.  5. Hyperlipidemia under treatment.   HISTORY:  The patient is a 77 year old female who has significant  coronary artery disease, peripheral vascular disease, hyperlipidemia and  previous severe high blood pressure.  The patient was admitted about a  month ago with hypertensive urgency and came under control.  At the time  she had been taking nonsteroidal anti-inflammatories and was advised to  discontinue this.  She was admitted the evening of admission with severe  elevation of blood pressure.  She was unclear why she actually came to  the hospital other than she took her blood pressure and found it to be  elevated.  She was admitted to the hospital for further evaluation and  treatment of hypertensive urgency.  Please see the previously dictated  history and physical for remainder of the details.   HOSPITAL COURSE:  The patient was brought into the hospital and admitted  to a step-down unit.  Laboratory data showed a normal CBC.  Chemistry  panel showed a sodium of 143, potassium of 4, chloride 107, CO2 26, BUN  20, creatinine of 1.16.  CPK-MB was 4.7.  Troponin is 0.06 and 0.05.  B  natriuretic peptide was 263.  A chest x-ray showed bilateral breast  implants that were calcified, hyperinflation.  A CT angiogram of the  chest done on August 18 showed significant  thickening of the distal  esophagus possibly associated with reflux.  No interval change from  examination of July 28.  She had aortic branch vessel stenosis noted but  no acute aortic abnormality or pulmonary emboli was identified.  She was  treated with nitroglycerin and continued on her medicines as her blood  pressure came down fairly easily.  She was taken off of nitroglycerin  the next day.  I opted to do a CT angiogram which showed diffuse  atherosclerosis of the abdominal aorta and iliac arteries.  No aneurysm.  There was proximal celiac and SMA disease noted with some luminal  stenosis especially in the superior mesenteric artery.  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  There were single bilateral renal  arteries with dense ostial calcifications bilaterally associated with  mild right and moderate left proximal renal artery stenosis.  There was  no significant post stenotic dilatation.  Kidneys were normal in size.  It was thought that she did have some bilateral ostial disease noted.  Severity was difficult to assess off of the CT angiogram.  Her blood  pressure remained under fairly good control and I opted to add  spironolactone 25 mg to her regimen for further control of blood  pressure.   The patient exhibited some short term memory defects in the hospital  although she did state that she was compliant with her medicines at home  and insisted that she was not skipping medications.  Because of the fact  that her blood pressure fairly easily comes under control after  admission I had some concerns about whether or not she is missing or not  taking medicines properly at home since she lives alone.  We will  arrange for her to have a home health followup to assess the home  situation and medicine compliance.  She is discharged at this time on:   DISCHARGE MEDICATIONS:  1. Metoprolol succinate extended release 50 mg daily.  2. Isosorbide mononitrate 60 mg daily.  3.  Vytorin 10/40 mg daily.  4. Prilosec 40 mg b.i.d.  5. Plavix 75 mg daily.  6. Furosemide 20 mg b.i.d.  7. Hydralazine 50 mg 2 tablets b.i.d.  8. Gabapentin 300 mg b.i.d.  9. Losartan 50 mg b.i.d.  10.Aspirin 81 mg daily.  11.Amaryl 1/2 of a 2 mg pill daily with the addition of.  12.Spironolactone 25 mg daily.   She was given electronic medicine reconciliation and a prescription for  spironolactone.  She is to return to the office in 1 week and is to  bring her medicine bottles with her at this time.      Georga Hacking, M.D.  Electronically Signed     WST/MEDQ  D:  01/08/2009  T:  01/08/2009  Job:  [id number]   cc:   Donia Guiles, M.D.

## 2010-10-04 NOTE — Procedures (Signed)
CAROTID DUPLEX EXAM   INDICATION:  Follow-up carotid artery disease.   HISTORY:  Diabetes:  Yes.  Cardiac:  CABG.  Hypertension:  Yes.  Smoking:  Previous.  Previous Surgery:  Right CEA in August 2003 by Dr. Madilyn Fireman.  CV History:  TIA in 1993.  Amaurosis Fugax No, Paresthesias No, Hemiparesis No                                       RIGHT             LEFT  Brachial systolic pressure:         146               140  Brachial Doppler waveforms:         WNL               WNL  Vertebral direction of flow:        Antegrade         Antegrade  DUPLEX VELOCITIES (cm/sec)  CCA peak systolic                   101               110  ECA peak systolic                   121               229  ICA peak systolic                   97                103  ICA end diastolic                   23                29  PLAQUE MORPHOLOGY:                  Calcific          Calcific  PLAQUE AMOUNT:                      Mild              Mild  PLAQUE LOCATION:                    ICA/CCA           ICA/CCA/ECA   IMPRESSION:  1. Bilateral internal carotid arteries show evidence of 1% to 39%      stenosis.  2. Left external carotid artery stenosis.  3. No significant changes from previous study.   ___________________________________________  Larina Earthly, M.D.   AS/MEDQ  D:  02/08/2009  T:  02/08/2009  Job:  [id number]

## 2010-10-04 NOTE — Discharge Summary (Signed)
[name], [name]            ACCOUNT NO.:  [id number]   MEDICAL RECORD NO.:  [id number]          PATIENT TYPE:  INP   LOCATION:  2006                         FACILITY:  [facility]   PHYSICIAN:  Georga Hacking, M.D.DATE OF BIRTH:  20-Oct-1926   DATE OF ADMISSION:  12/16/2008  DATE OF DISCHARGE:  12/19/2008                               DISCHARGE SUMMARY   FINAL DIAGNOSES:  1. Accelerated hypertension, resolved.  2. Chest pain, myocardial infarction ruled out.  3. Coronary artery disease with previous bypass grafting and previous      stenting of the left main.  4. Anxiety.  5. Hiatal hernia.  6. Severe peripheral vascular disease.  7. Possible mild dementia.   HISTORY:  A 77 year old female with longstanding coronary disease and  peripheral vascular disease with hypertension, hyperlipidemia.  She goes  to maintenance rehabilitation and when she was to start rehabilitation  her blood pressure was in the range of 190 over systolic, she was told  to go home.  When she was at home she became anxious, had severe back  pain with radiation to the left side of the chest, sharp nonpleuritic  with no associated shortness of breath or sweating.  She called 9-1-1.  EMS gave her aspirin, sublingual nitroglycerin.  She was hypertensive in  the emergency room, placed on a nitroglycerin drip and was admitted to  rule out a myocardial infarction.  Please see the previously dictated  history and physical for remainder of the details.   HOSPITAL COURSE:  Lab data shows a normal CBC, normal PT and PTT.  Serial cardiac enzymes were negative for ischemia.  TSH is 2.040.  electrocardiogram showed minor nonspecific ST and T-wave changes.  The  patient was admitted to the hospital and her history was somewhat  difficult the next day.  IV nitroglycerin was discontinued and she was  to ambulate in the hall if stable.  I noted that she did not remember  even having had chest or back pain the day before,  when I questioned her  the next day, she lives alone at home.  She was seen by cardiac rehab  and her blood pressure was fine on the 29th.  She had some intermittent  confusion.  She was restarted on nitroglycerin again on December 17, 2008,  with no recurrence of chest pain.  Nitroglycerin was stopped.  She was  started on Lasix, and Cozaar and hydralazine were increased.  She was  quite anxious and was somewhat wobbly in her gait, blood pressure was  fine today.  Physical therapy evaluation noted that a rolling walker was  needed to stabilize gait pattern.  She was seen on December 19, 2008, with a  blood pressure of 112/44, pulse in the 60s and was sent home.  She is to  follow up with me in 1 week.   DISCHARGE MEDICATIONS:  Include:  1. Plavix 75 mg daily.  2. Aspirin 325 mg daily.  3. Gabapentin 300 mg twice a day.  4. Cozaar 50 mg b.i.d.  5. Citalopram 20 mg daily.  6. Amaryl 1 mg daily.  7. Prilosec  20 mg twice a day.  8. Toprol XL 50 mg daily.  9. Hydralazine 50 t.i.d.  10.Vytorin 10/40 mg daily.  11.Isosorbide 60 mg daily.  12.Lasix 20 mg twice a day.   Follow-up in 1 week.      Georga Hacking, M.D.  Electronically Signed     WST/MEDQ  D:  01/18/2009  T:  01/18/2009  Job:  [id number]   cc:   Dr. Arvilla Market

## 2010-10-04 NOTE — Procedures (Signed)
CAROTID DUPLEX EXAM   INDICATION:  Followup right carotid endarterectomy.   HISTORY:  Diabetes:  Yes.  Cardiac:  CABG.  Hypertension:  Yes.  Smoking:  Previous.  Previous Surgery:  Right carotid endarterectomy in August of 2003.  CV History:  TIA in 1993.  Amaurosis Fugax No, Paresthesias No, Hemiparesis No                                       RIGHT             LEFT  Brachial systolic pressure:         136               132  Brachial Doppler waveforms:         Normal            Normal  Vertebral direction of flow:        Antegrade         Antegrade  DUPLEX VELOCITIES (cm/sec)  CCA peak systolic                   104               107  ECA peak systolic                   99                299  ICA peak systolic                   85                81  ICA end diastolic                   27                21  PLAQUE MORPHOLOGY:                  Heterogenous      Heterogenous  PLAQUE AMOUNT:                      Mild              Mild  PLAQUE LOCATION:                    ICA/CCA           ICA/ECA/CCA   IMPRESSION:  1. Patent right carotid endarterectomy site with a 1-39% stenosis of      the right internal carotid artery noted.  2. 1-39% stenosis of the left internal carotid artery noted.  3. No significant change noted when compared to the previous exam on      02/13/2007.   ___________________________________________  P. Liliane Bade, M.D.   CH/MEDQ  D:  03/05/2008  T:  03/05/2008  Job:  [id number]

## 2010-10-04 NOTE — H&P (Signed)
[name], [name]            ACCOUNT NO.:  [id number]   MEDICAL RECORD NO.:  [id number]          PATIENT TYPE:  INP   LOCATION:  3705                         FACILITY:  [facility]   PHYSICIAN:  Michelene Gardener, MD    DATE OF BIRTH:  [birth year]   DATE OF ADMISSION:  01/15/2007  DATE OF DISCHARGE:                              HISTORY & PHYSICAL   PRIMARY CARE PHYSICIAN:  Dr. Donia Guiles.   CARDIOLOGIST:  Dr. Viann Fish.   CHIEF COMPLAINT:  Chest pain.   HISTORY OF PRESENT ILLNESS:  The patient is a 77 year old white female  with past medical history of CAD with history of right coronary artery  stenosis, medically managed, hypertensive heart disease, PVD, diabetes  mellitus, diastolic congestive heart failure and mild dementia possibly,  who presents to the emergency room after an episode of chest pain.  The  patient tells me that she was noncompliant in taking her medications  today.  She says that she has her medications, but she got so busy that  she did not remember to take them and did not take any of her medicines.  She started having chest pain this evening described as mostly sharp,  but with some pressure on her left side and underneath her left breast  radiating into her back.  She had no associated shortness of breath, but  came into the emergency room.  In the emergency room, she had labs done.  She was found to be slightly dry with a BUN of 24 and creatinine of 3.1.  Cardiac markers were unremarkable.  EKG showed no acute ST- or T-wave  elevations and her chest x-ray was unremarkable as well, but with her  previous history, it was felt best that she come in for further  evaluation.  Currently, she is feeling a little bit better.  She said  her chest pain has almost completely gone.  She still feels some  residual soreness.  She denies any headaches, vision changes, dysphagia.  No shortness of breath, wheeze or cough.  No abdominal pain.  No  hematuria, dysuria,  constipation, diarrhea, focal extremity numbness,  weakness or pain.  Review of systems is otherwise negative.   PAST MEDICAL HISTORY:  1. Hypotension.  2. CAD with history of right coronary artery stenosis, medically      managed.  3. Diastolic congestive heart failure.  4. Diabetes mellitus.  5. Gastroesophageal reflux disease.  6. Possible mild dementia.   MEDICATIONS:  Based on her last discharge summary of April 2008, she is  on:  1. Plavix 75.  2. Trazodone 50 nightly.  3. Aspirin 81 p.o. daily.  4. Altace 10 p.o. daily.  5. Cozaar 50 p.o. daily.  6. Amaryl 2 p.o. daily.  7. Hydralazine 50 p.o. b.i.d.  8. Toprol-XL 50 p.o. daily.  9. Neurontin 300 p.o. b.i.d.  10.Imdur 60 p.o. daily.  11.Vytorin 10/40 p.o. daily.  12.Lasix 40 p.o. daily.  13.P.r.n. nitroglycerin.   ALLERGIES:  She has allergies to CLINDAMYCIN.   SOCIAL HISTORY:  She denies any tobacco, alcohol or drug use.  She lives  at home  alone.   FAMILY HISTORY:  Noncontributory.   PHYSICAL EXAMINATION:  VITALS ON ADMISSION:  Temperature 97.4.  Heart  rate 77.  Blood pressure 186/87; since then, it has come down to 169/86.  Respirations 20.  O2 SAT 99% on 2 L.  GENERAL:  She is alert and oriented x3, in no apparent distress.  HEENT:  Normocephalic, atraumatic.  Her mucous membranes are moist.  NECK:  She has no carotid bruits.  HEART:  Regular rate and rhythm, S1 and S2.  A 2/6 systolic ejection  murmur, occasional ectopic beat.  LUNGS:  Clear to auscultation bilaterally.  ABDOMEN:  Soft, nontender and non-distended.  Positive bowel sounds.  EXTREMITIES:  No clubbing, cyanosis or edema.   LABORATORY WORK:  CPK 105, MB 3.1, troponin I less than 0.05.  Sodium  128, potassium 4.1, chloride 99, bicarb 24, BUN 24, creatinine 1.3,  glucose 106.   EKG not present on chart, but is reported as no ST- or T-wave  abnormalities.   Chest x-ray is unremarkable.   ASSESSMENT AND PLAN:  1. Chest pain:  Given the  patient's extensive coronary artery disease,      this could be a possible anginal equivalent, especially in the      setting of noncompliance.  We will plan to continue all of her      medication, keep her on a telemetry bed for 24-hour observation and      recheck serial enzymes.  Importantly, she has a previous history of      cardiac catheterization and she is medically managed only.  We will      continue to follow.  2. Diabetes mellitus:  Continue Amaryl and sliding scale.  3. Hypertension:  Continue medications.  4. Renal insufficiency, mild:  Gently hydrate and resume Lasix.      Hollice Espy, M.D.  Electronically Signed      Michelene Gardener, MD  Electronically Signed    SKK/MEDQ  D:  01/15/2007  T:  01/15/2007  Job:  [id number]   cc:   Georga Hacking, M.D.  Donia Guiles, M.D.

## 2010-10-04 NOTE — Consult Note (Signed)
NAMEGRISELL, [name]            ACCOUNT NO.:  [id number]   MEDICAL RECORD NO.:  [id number]          PATIENT TYPE:  INP   LOCATION:  3705                         FACILITY:  [facility]   PHYSICIAN:  Georga Hacking, M.D.DATE OF BIRTH:  February 16, 1927   DATE OF CONSULTATION:  01/15/2007  DATE OF DISCHARGE:                                 CONSULTATION   REASON FOR CONSULTATION:  Chest and back pain with abnormal cardiac  enzymes.   HISTORY:  The patient is a 77 year old female with a complex past  medical history.  She has a known history of severe coronary artery  disease and has had a previous bypass grafting in 1981, 1982, 1987, and  1999 with internal mammary bypass of the LAD at that time.  She was  hospitalized with an allergic reaction to clindamycin in January  complicated by heart failure, which was diastolic, as well as a  subendocardial infarct.  The BNP fell.  She had some unusual mental  status on affect, a non-Q wave infarct.  Catheterization done, of the  brachial approach, showed a patent internal mammary artery, there was  diffuse in-stent restenosis of the left main stent, a 40-50% second  marginal was occluded at the site of previous angioplasty, first  marginal was patent with a 30% stenosis, right coronary artery with 60%  mid and 99% distal.  She was felt to be difficult to do intervention on  and it was recommended that she be treated medically.  She was  hospitalized in April with recurrent back pain, chest pain and came to  the emergency room.  She has had gait disturbance and trouble walking at  home at that time.  She was admitted at that time and wished to return  home and was seen by Dr. Jeanie Sewer who deemed that she was competent.  There has been some concern through the admissions about her mental  status and some mild memory disorder.   She completed rehab recently but was last seen by me in May.  She had  been feeling well but recently developed  diverticulitis and was seen by  Dr. Arvilla Market.  She was treated with Cipro and has complained of weight  loss and severe malaise and fatigue.  She went out yesterday and did not  take any of her usual medications all day yesterday and came to the  emergency room by EMS last night complaining of mid back and anterior  chest discomfort that gradually resolved.  She took two nitroglycerin at  home.  Initial point of care enzymes were negative but she has  subsequently developed a troponin of 0.11 and a mild elevation of MB,  EKG was somewhat unremarkable.   PAST HISTORY:  Is quite complex.  1. She has a history of bypass grafting 4 times previously.  2. She has had a previous history of:      a.     Hypertension.      b.     Type 2 diabetes.      c.     Peripheral vascular disease.      d.  Significant anxiety/depression.      e.     History of Bell's palsy.      f.     Hyperlipidemia.      g.     Esophageal reflux.      h.     She is thought to have some early dementia.   PREVIOUS SURGERIES:  1. Cholecystectomy.  2. Hysterectomy.  3. Appendectomy.  4. Bypass grafting x4 separate occasions.  5. Right carotid endarterectomy.  6. She has had removal of a limp fistula in her right leg.   CURRENT MEDICATIONS:  1. Plavix 75 daily.  2. Aspirin daily.  3. Altace 10 mg daily.  4. Amaryl 2 mg daily.  5. Cozaar 50 mg b.i.d.  6. Cipro.  7. Hydralazine 50 b.i.d.  8. Toprol-XL 50 mg daily.  9. Neurontin b.i.d.  10.Imdur 60 mg daily.  11.Vytorin 10/40 daily.  12.Lasix 40 mg in the morning.  13.Nitroglycerin as needed.  14.Trazodone 50 mg.   FAMILY AND SOCIAL HISTORY:  See the previous records.   REVIEW OF SYSTEMS:  Always a nebulous historian.  No cataracts or  glaucoma.  Some difficulty swallowing.  Some dyspepsia and indigestion.  She has had edema involving her lower extremities.  No urinary symptoms.  Recent hospitalization for diverticulitis.  Previous carotid   endarterectomy.  No history of stroke or TIA.   EXAMINATION:  She is an elderly female who is somewhat addled.  Blood  pressure is currently 150/60, pulse was currently 72.  Skin was warm and  dry.  ENT:  EOMI, PERRLA, CNS clear, fundi not examined, pharynx negative.  Previous right carotid endarterectomy scar noted, no bruits.  LUNGS:  Clear to A&P.  CARDIAC EXAM:  Normal S1 and S2, no S3.  A 1-2/6 murmur.  ABDOMEN:  Soft, nontender, nondistended.  Distal pulses 2+.  Previous  scars noted in groins.  Trace edema noted.   EKG shows moderate nonspecific ST and T wave abnormality.   LABORATORY DATA:  A sodium of 132, potassium 4.4, CO2 24, glucose 107,  BUN 16, creatinine of 0.93.  A CPK is 113 with an MB of 6.4, troponin is  0.11.   IMPRESSION:  1. Chest pain and back pain that is probably anginal or an ischemic      event, probably precipitated by noncompliance with medical regimen      yesterday.  2. Coronary artery disease with previous bypass graft with a patent      internal mammary graft demonstrated previously.  Disease involving      the distal right coronary artery which has been thought to be      difficult to do intervention on and recommended medical treatment.  3. Hypertensive heart disease with elevation of blood pressure on      admission.  4. Hyperlipidemia.  5. Type 2 diabetes.  6. Probable early dementia.  7. Peripheral neuropathy.   RECOMMENDATIONS:  She is quite complex and issues that really concern me  are the issues of possible dementia and memory loss as to why she did  not take medicines all day yesterday.  She may need to have this  reassessed while in the hospital.  She has also been treated for recent  diverticulosis or colitis and is concerned about her weight loss and her  malaise and fatigue.  I would restart her cardiac medicines, follow  enzymes and serial EKGs, but unless she has disabling chest pain do not  think further cardiac  workup will  be that helpful.  The major things to  be addressed would be the social issues and the weight loss and malaise  and fatigue.   Thank you for asking me to see her with you.      Georga Hacking, M.D.  Electronically Signed     WST/MEDQ  D:  01/15/2007  T:  01/15/2007  Job:  [id number]   cc:   Donia Guiles, M.D.

## 2010-10-07 NOTE — Consult Note (Signed)
[name]. [facility]  Patient:    [name]                     MRN: [id number] Proc. Date: 12/16/99 Attending:  Darden Palmer., M.D. CC:         [name], M.D.                          Consultation Report  HISTORY OF PRESENT ILLNESS:  The patient is a 77 year old female who has severe diffuse coronary artery disease. She has a longstanding history of CAD and has had bypass grafting four times previously. Her first bypass was in 1981 with redo in 1982, a third heart operation with the second redo in May of 1987. She has since then had some interventional medical therapy and had unstable angina develop in August of 1999. Catheterization, at that time, showed a tight stenosis in the vein graft to the LAD. She went to [place] and had a redo bypass for the fourth time with an internal mammary graft placed to the distal portion of the vein graft to the LAD. This was using the right internal mammary artery. She had a slow process following surgery and did have to have some rehab. She has been somewhat difficult to evaluate over the years with significant situational stress. She has also had difficulty with abdominal discomfort that has been evaluated by [name], M.D. in the past. She has a known solitary gallstone which has been present for years. She was admitted the night prior to admission with nausea, vomiting, and right upper quadrant pain which was severe. She developed the abdominal pain in the evening and came to the emergency room, and her symptoms eventually came under control. She was demonstrated to have a solitary gallstone, and [name], M.D. asked that I see her for follow-up.  The patient has some atypical mid back pain but has not had any severe unstable angina and has not had congestive heart failure. She has had a prior history of depression and some pneumonia also. She was most recently seen in April of 2001 where she  was continuing to complain of some lower leg weakness and some moderate pain involving her legs. At the time I am presently seeing her, she had recurrent abdominal pain and nausea following feeding.  CURRENT MEDICATIONS: 1. Aspirin. 2. Premarin. 3. She is currently off of Prilosec but had been on acid suppression therapy    in the past. 4. She is on Cozaar 50 b.i.d. 5. Desyrel 100 q.h.s. 6. Lipitor 20 mg daily. 7. Toprol XL 50 mg daily. 8. Triamterene.  PHYSICAL EXAMINATION:  GENERAL:  She is a middle-aged woman who appears younger than her stated age though appeared in moderate abdominal distress.  VITAL SIGNS:  Her blood pressure was 130/70, pulse was 76.  SKIN:  Warm and dry.  ENT/NECK:  No carotid bruits.  LUNGS:  Clear.  CARDIAC:  Normal S1/S2. There was no S3, S4, or murmur.  ABDOMEN:  Shows right upper quadrant tenderness without rebound. Bowel sounds were present.  EXTREMITIES:  There is no edema noted.  LABORATORY DATA:  Twelve-lead ECG shows minor nonspecific ST and T wave changes with less degree of T wave inversion noted on her ECG when compared to a previous one in September of 1999.  Initial lab studies showed a white count of 20,000, with normal liver enzymes.  IMPRESSION: 1. Severe  abdominal pain with nausea and vomiting. Most likely    gastrointestinal in origin. Differential possibilities would include    gallstones or dyspepsia. She does not have evidence of common duct    obstruction at this time. 2. Coronary artery disease with previous history of coronary artery bypass    grafting x 4. Atypical symptoms but doubt that she has any unstable angina    at this time. 3. History of depression. 4. Dyslipidemia under treatment. 5. Hypertension.  RECOMMENDATIONS:  [name] has historically been very difficult to assess in the past. However, at the present time, I think her cardiac symptoms are stable. If this does indeed turn out to be  gallbladder disease, I think that she could be considered for surgery and that her surgical risks would not be excessive. It may be useful to get a PIPIDA scan to determine if she has any cystic duct obstruction.  I appreciate seeing [name] with you. DD:  12/22/99 TD:  12/22/99 Job: [id number]

## 2010-10-07 NOTE — Op Note (Signed)
[place]. [facility]  Patient:    [name], [name] Visit Number: [id number] MRN: [id number]          Service Type: END Location: ENDO Attending Physician:  Louie Bun Dictated by:   Everardo All Madilyn Fireman, M.D. Proc. Date: 08/27/01 Admit Date:  08/27/2001 Discharge Date: 08/27/2001   CC:         Desma Maxim, M.D.   Operative Report  INDICATION FOR PROCEDURE:  Screening colonoscopy in a 77 year old patient.  PROCEDURE:  The patient was placed in the left lateral decubitus position and placed on the pulse monitor with continuous low flow oxygen delivered by nasal cannula.  She was sedated with 75 mg of IV Demerol and 7.5 mg of IV Versed. The Olympus video colonoscope was inserted into the rectum and advanced to the cecum.  Confirmed by transillumination at McBurney point and visualization of the ileocecal valve and the appendiceal orifice.  Prep was excellent.  The cecum, ascending, transverse, and descending colon all appeared normal with no masses, polyps, diverticula, or other mucosal abnormalities.  In the sigmoid colon there were seen several scattered diverticula.  The rectum appeared normal in retroflexed view.  The anus revealed no obvious internal hemorrhoids.  The colonoscope was then withdrawn, and the patient returned to the recovery room in stable condition.  The patient tolerated the procedure well and there were no immediate complications.  IMPRESSION:  Diverticulosis of the left colon, otherwise normal colonoscopy.  PLAN:  Flexible sigmoidoscopy in five years. Dictated by:   Everardo All Madilyn Fireman, M.D. Attending Physician:  Louie Bun DD:  08/27/01 TD:  08/27/01 Job: [id number]

## 2010-10-07 NOTE — Op Note (Signed)
[name], [name]            ACCOUNT NO.:  [id number]   MEDICAL RECORD NO.:  [id number]          PATIENT TYPE:  AMB   LOCATION:  ENDO                         FACILITY:  [facility]   PHYSICIAN:  John C. Madilyn Fireman, M.D.    DATE OF BIRTH:  1927/04/20   DATE OF PROCEDURE:  06/23/2004  DATE OF DISCHARGE:                                 OPERATIVE REPORT   PROCEDURE PERFORMED:  Esophagogastroduodenoscopy.   INDICATIONS FOR PROCEDURE:  Chronic worsening reflux symptoms and diarrhea  with only partial response to proton pump inhibitor.  The procedure is to  assess for refractory esophagitis, large hiatal hernia, any infiltrate or  inflammatory process of the stomach or duodenum and to take biopsies to rule  out celiac disease.   PROCEDURE:  The patient was placed in the left lateral decubitus position  and placed on the pulse monitor with continuous low-flow oxygen delivered by  nasal cannula. She was sedated with 50 mcg IV fentanyl and 5 milligrams IV  Versed. The Olympus video endoscope was advanced under direct vision into  the oropharynx and esophagus. The esophagus was straight and of normal  caliber with the squamocolumnar line at 38 cm.  There was approximately 2 cm  hiatal hernia with no visible ring, stricture or esophagitis. Stomach was  entered and a small amount of liquid secretions were suctioned from the  fundus. Retroflexed view of the cardia confirmed a hiatal hernia was  otherwise unremarkable. The fundus, body, antrum and pylorus all appeared  normal. The duodenum was entered and both bulb and second portion were  inspected appeared be been normal limits. Biopsies were taken of the distal  duodenum to rule out celiac disease.  The scope withdrawn back into the  stomach and a CLO-test obtained. The scope was then withdrawn and the  patient returned to the recovery room in stable condition.  She tolerated  the procedure well. There were no immediate complications.   IMPRESSION:   Hiatal hernia, otherwise normal study.   PLAN:  Await all biopsy results.      JCH/MEDQ  D:  06/23/2004  T:  06/23/2004  Job:  [id number]   cc:   Donia Guiles, M.D.  [address] [place]  [place] [address]  Fax: [phone]

## 2010-10-07 NOTE — Consult Note (Signed)
[name], [name]            ACCOUNT NO.:  [id number]   MEDICAL RECORD NO.:  [id number]          PATIENT TYPE:  INP   LOCATION:  0165                         [facility]ACILITY:  [facility]   PHYSICIAN:  Peter M. Swaziland, M.D.  DATE OF BIRTH:  08-Feb-1927   DATE OF CONSULTATION:  05/12/2006  DATE OF DISCHARGE:                                 CONSULTATION   HISTORY OF PRESENT ILLNESS:  Ms. [name] is a 77 year old white female  admitted yesterday with acute drug reaction.  She had recent oral  surgery approximately 1 week ago and was treated afterwards with  clindamycin.  She subsequently development a diffuse red rash.  She had  also noted some increased shortness of breath during this period.  She  was admitted yesterday and placed on steroids and Benadryl.  Today she  was noted to have increased wheezing and a chest x-ray demonstrated  bilateral pulmonary infiltrates consistent with edema.  The patient is a  very poor historian.  She does note that she has had some left posterior  chest pain recently which she thinks is her old anginal symptoms.  She  really does not know if she is short of breath or not.   PAST MEDICAL HISTORY:  1. She has a history of coronary artery disease.  She has had previous      bypass surgery apparently in 1981, 1982 and 1987.  She has had      prior stenting in the left main coronary artery.  She had a cardiac      catheterization last in March 2003 which demonstrated severe three-      vessel coronary artery disease.  She did have patent LIMA graft to      the second marginal vessel and a patent RIMA graft to the LAD.      Ejection fraction that time was 60% with lateral hypokinesia.  2. Hypertension.  3. Diabetes mellitus type 2.  4. Status post right carotid endarterectomy.  5. Anxiety and depression.  6. Gastroesophageal reflux disease.  7. History of Bell's palsy.  8. Hyperlipidemia.   PAST SURGICAL HISTORY:  Bypass surgeries, right carotid  endarterectomy,  cholecystectomy, hysterectomy and appendectomy.   She has no known allergies.   CURRENT MEDICATIONS:  1. Insulin on a sliding scale.  2. Plavix 75 mg per day.  3. Aspirin 81 mg per day.  4. Methylprednisolone 60 mg IV b.i.d.  5. Benadryl 50 mg q.6 h IV.  6. Lovenox 40 mg subcu q 24 hours.  7. Altace 10 mg per day.  8. Amaryl 2 mg daily.  9. Cozaar 50 mg b.i.d.  10.Apresoline 50 mg b.i.d.  11.Toprol XL 50 mg per day.  12.Remeron 15 mg q.h.s.  13.Neurontin 300 mg b.i.d.  14.Famotidine 20 mg IV q.12 h.   SOCIAL HISTORY:  The patient lives alone.  She was two children.  She is  retired.  She quit smoking in 1981.   FAMILY HISTORY:  Father died of pneumonia at an early age.  Her mother  died at age 57 with gallbladder disease.   REVIEW OF SYSTEMS:  Otherwise  really unobtainable.   PHYSICAL EXAMINATION:  GENERAL:  The patient is an elderly white female  who appears dyspneic and anxious.  VITAL SIGNS:  Blood pressure 156/60, pulse 72 and regular.  She is  afebrile.  Sats are 99%.  NECK:  She has an old right carotid endarterectomy scar.  I do not  appreciate any JVD.  LUNGS:  Reveal bilateral wheezes.  CARDIAC:  Exam reveals regular rate and rhythm without murmur, rub or  gallop.  ABDOMEN:  Soft and nontender.  She has no lower extremity edema.  Pulses  were 1+ and symmetric.  She does have a diffuse macular rash over her  entire body.  NEUROLOGIC:  She appears mildly confused and mildly anxious.   LABORATORY DATA:  Chest x-ray shows bilateral infiltrates consistent  with edema.  ECG shows normal sinus rhythm with a question of old  inferior infarction.  There are lateral ST-T wave changes consistent  with ischemia.  White count is 19,600, hemoglobin 12.3, hematocrit 26.3,  platelets 420,000.  Sodium 138, potassium 3.3, chloride 101, CO2 26, BUN  16, creatinine 1.1, glucose 204.  Troponin is 1.93.  CK is 228 with 28.3  MB.   IMPRESSION:  1. Dyspnea with  bilateral pulmonary infiltrates most consistent with      congestive heart failure.  Also need to consider noncardiogenic      pulmonary edema given a recent drug reaction.  2. Abnormal cardiac enzymes consistent with non-Q-wave myocardial      infarction.  3. Coronary artery disease status post multiple bypass procedures in      the past.  4. Hypertension.  5. Acute drug reaction  6. Diabetes mellitus type 2.  7. Hyperlipidemia.   PLAN:  Will obtain liver function studies, BNP level and serial cardiac  enzymes. She will be treated with IV diuresis with Lasix. Will obtain  strict Is and Os and daily weights, a follow-up chest x-ray in the  morning.  Will also obtain an echocardiogram.  Will increase her Lovenox  to full therapeutic dose.           ______________________________  Peter M. Swaziland, M.D.     PMJ/MEDQ  D:  05/12/2006  T:  05/14/2006  Job:  [id number]   cc:   Theone Stanley, MD   Georga Hacking, M.D.  Fax: [phone]  Email: [email or web address]

## 2010-10-07 NOTE — Discharge Summary (Signed)
[name], [name]            ACCOUNT NO.:  [id number]   MEDICAL RECORD NO.:  [id number]          PATIENT TYPE:  INP   LOCATION:  2038                         FACILITY:  [facility]   PHYSICIAN:  Georga Hacking, M.D.DATE OF BIRTH:  1926-12-23   DATE OF ADMISSION:  05/14/2006  DATE OF DISCHARGE:  05/23/2006                               DISCHARGE SUMMARY   FINAL DIAGNOSES:  1. Subendocardial infarction, initial episode.      a.     Coronary artery disease revealing distal disease in the       right coronary artery, treated medically.      b.     Patent internal mammary graft to the left anterior       descending.      c.     Diffuse in-stent re-stenosis of moderate severity of the       left main.  2. Congestive heart failure due to diastolic dysfunction.  3. Allergic drug reaction to CLINDAMYCIN that precipitated admission.  4. Type 2 diabetes.  5. Anxiety/depression/probable early dementia.  6. History of reflux.  7. Hyperlipidemia.  8. Hypertensive heart disease.   PROCEDURES:  Cardiac catheterization by brachial approach on May 17, 2006.   CONSULTATIONS:  Dr. [name].   HISTORY:  This 77 year old female has a known history of coronary artery  disease.  She evidently developed some dental work and took clindamycin  for this and then developed a diffuse rash, dyspnea, shoulder pain and  perhaps some chest pain and was taken to an urgent care center and then  taken to [facility].  She initially was found to be in  congestive heart failure but was a very poor historian and was quite  confused.   After admission there, she was found to be in congestive heart failure  and was given intravenous Lasix.  She also had elevations of her cardiac  enzymes.  While at [facility], she was treated with  methylprednisolone and Benadryl and placed on Lovenox.   She was transferred to [facility] after the initial intensive  care unit stay.   Please see the previously dictated history and physical  for remainder of the details.   HOSPITAL COURSE:  After transfer to [facility], she remained  stable with normal CBC and hemoglobin.  Beta natriuretic peptide  gradually fell to 283.  Urine culture showed 40,000 colonies.  Her drug  exanthem improved, and she continued to feel better.  She did not have  any recurrent chest pain or shortness of breath.  She was intermittently  confused when she was in the hospital initially, but this gradually  improved.   She initially was evaluated by PT and OT, and they initially suggested  that she would not be able to return home but ultimately was able to  return home and did quite well with this.  She did not have any  complaints of chest pain or confusion and eventually improved.  Pulmonary edema cleared with diuresis.   She was evaluated by psychiatry, who recommended DC of Remeron and noted  that  she remained confused.  An MRI was negative.   She was felt to have a non-Q-wave myocardial infarction.  Her Lasix was  reduced and later discontinued.  Dr. Corliss Marcus did a brachial  procedure on the patient with findings of a patent internal mammary  artery graft.  There was diffuse in-stent restenosis of the left main.  At the site of the previous angioplasty, the second marginal was  occluded.  The first marginal was patent with approximately 30%.  Right  coronary artery had a 60 to 70% mid-vessel stenosis and a 99% distal  stenosis that was felt to be somewhat difficult to do intervention on.   She remained sleepy post PCI and had significant nausea that was  attributed to Celexa.  Celexa was discontinued, and the nausea improved.   She had a follow-up psychiatry consultation that recommended avoidance  of anticholinergics and recommended outpatient followup.   She had some mild dizziness, but her bradycardia improved, and, after  several additional days of rehab, her gait  had improved to the point  where they thought she could go home.  Her BUN and creatinine were  stable.  BNP level was low.  Echocardiogram showed preserved LV  function.   She is discharged at this time in improved condition.   DISCHARGE MEDICATIONS INCLUDE:  1. Aspirin 81 mg daily.  2. Plavix 75 mg daily.  3. Altace 10 mg daily.  4. Amaryl 2 mg daily.  5. Cozaar 50 mg b.i.d.  6. Hydralazine 50 mg b.i.d.  7. Toprol-XL 50 mg daily.  8. Neurontin 300 mg twice a day.  9. Imdur 30 mg daily.  10.Vytorin 10/20 mg daily.   DISCHARGE INSTRUCTIONS:  She is to have home health RN and is also to  have followup with outpatient psychiatry clinic per Dr. Jeanie Sewer.  She  will have a Child psychotherapist come into the house.  She is discharged at  this time in improved condition and is instructed to weigh daily.  She  is to follow up with her primary physician in one week, and I would like  to see her back in one week also.      Georga Hacking, M.D.  Electronically Signed     WST/MEDQ  D:  05/23/2006  T:  05/23/2006  Job:  [id number]   cc:   Francisca December, M.D.  [name], M.D.  Donia Guiles, M.D.

## 2010-10-07 NOTE — Cardiovascular Report (Signed)
[name]            ACCOUNT NO.:  [id number]   MEDICAL RECORD NO.:  [id number]          PATIENT TYPE:  INP   LOCATION:  2038                         FACILITY:  [facility]   PHYSICIAN:  Francisca December, M.D.  DATE OF BIRTH:  1926/05/27   DATE OF PROCEDURE:  05/17/2006  DATE OF DISCHARGE:                            CARDIAC CATHETERIZATION   PROCEDURES PERFORMED:  1. Left heart catheterization.  2. Coronary angiography.  3. Saphenous vein graft angiography.  4. IMA graft angiography.   INDICATIONS:  [name] is a 77 year old woman with extensive  atherosclerotic peripheral and coronary vascular disease.  She has had  coronary bypass surgery four times, the last being in 1999.  She has  known LIMA to the OM and an RIMA to the LAD.  Her right coronary has  never required bypass.  Other saphenous vein grafts are known to be  occluded.  She had a recent NSTEMI in association with a drug reaction  and heart failure.  CK-MB as high as 32.  She is brought to  catheterization laboratory at this time to evaluate the extent of  disease and provide for further therapeutic options.   PROCEDURE NOTE:  The patient is brought to cardiac catheterization  laboratory in the fasting state.  The right brachial region was prepped  and draped in the usual sterile fashion.  Local anesthesia was obtained  with infiltration of 1% lidocaine.  A 4-French catheter sheath was  inserted percutaneously into the right brachial artery utilizing an  anterior approach over a guiding J-wire.  A 110-cm pigtail catheter was  then used to measure pressures in the ascending aorta and in the left  ventricle, both prior to and following the ventriculogram.  A 30 degrees  RAO cine left ventriculogram was performed utilizing power injector.  This catheter was then exchanged over a long guiding J-wire for a AL-2 4-  [place] catheter.  Cineangiography of left coronary was conducted in LAO  and RAO projections.   Was also used in the conducted angiogram of an  occluded saphenous vein graft.  Finally the catheter was then  manipulated inferiorly into the right and cannulated the right coronary.  Cineangiography of the right coronary was conducted in LAO and RAO  projections.  The AL catheter was then exchanged for a 4-French #4 right  [place] catheter.  This catheter did not quite adequately cannulate the  RIMA.  It was exchanged for a LIMA catheter.  This did adequately  cannulate the RIMA and cineangiography of this conduit to the LAD was  conducted in LAO and RAO projections.  This catheter was then removed.  It should be noted the patient received 1500 units of heparin via the  arterial sheath prior to initiation of the angiography.  An ACT obtained  at the end of the procedure was 142 seconds.  The catheter was removed  and pressure held for 15 minutes over the brachial site.  Initial  pressure was occlusive for 3 minutes.  The site was then covered with a  sterile dressing and her arm placed on a short arm board.  The  radial  pulse was intact 5 minutes after completing pressure hold of the right  brachial artery.   HEMODYNAMIC RESULTS:  Systemic arterial pressure was 123/54 with a mean  of 79 mmHg.  There was no systolic gradient across the aortic valve.  Left ventricular end-diastolic pressure was 14 mmHg.   VENTRICULOGRAM.:   ANGIOGRAPHY:  The left ventriculogram demonstrated normal chamber size  and normal global systolic function.  A visual estimated ejection  fraction is 65%.  There is anterolateral akinesis noted.  There is no  significant mitral regurgitation.  Left and right coronary calcification  is seen.   There is a right-dominant coronary system present.  The main left  coronary artery is 50-60% stenotic in a diffuse fashion.  This is a  region of previous stent implantation.   The left anterior descending artery is completely occluded in the mid  portion with multiple  high-grade stenoses prior to this.   The left circumflex coronary artery gives rise to a small first marginal  branch which has a 50% ostial stenosis.  There is then a tubular 50%  stenosis in the mid portion.  The second marginal branch is occluded.  Third marginal branch is patent as is a fourth marginal or obtuse  marginal branch.  There are no significant obstructions in the distal  branches of the left circumflex.   The right coronary artery and its branches are highly diseased; the  vessel contains luminal irregularities in the proximal and mid segment.  Also in the mid segment there is a tubular 50% stenosis.  In the more  distal segment there is a subtotal stenosis just before the origin of  the posterolateral segment and the posterior descending artery.  The  poster descending artery and posterolateral segment are relatively  small.   The LIMA graft to the LAD is widely patent.  This is a large conduit.  It inserts upon the mid to distal portion of the LAD which is tented.  There is antegrade and retrograde filling.  Antegrade filling  demonstrates diffusely diseased vessel but patent to and just beyond the  apex.  There is retrograde filling to a small diagonal and small septal  perforator.  There is an 80% stenosis just before the origin of the  small diagonal branch, but before the origin of the septal perforator.   Collateral vessels are not seen.   FINAL IMPRESSION:  1. Atherosclerotic cardiovascular disease, severe three-vessel.  2. Intact global left ventricular systolic function with regional wall      motion abnormalities noted.  3. Likely culprit lesion for recent NSTEMI is in the distal right      coronary.  4. Widely patent LIMA graft to the LAD.  5. A 50% in-stent restenosis of the left main stent.   PLAN/RECOMMENDATIONS:  Previous cineangiograms will be reviewed to further assess the size of distal vessels in the right coronary.  Also,  will have Dr.  Donnie Aho to review the films and obtain an opinion as to the  appropriateness of proceeding with PCI, given her difficult access and  the associated higher risk.      Francisca December, M.D.  Electronically Signed     JHE/MEDQ  D:  05/17/2006  T:  05/18/2006  Job:  [id number]   cc:   Georga Hacking, M.D.  Donia Guiles, M.D.

## 2010-10-07 NOTE — Discharge Summary (Signed)
[name], [name]            ACCOUNT NO.:  [id number]   MEDICAL RECORD NO.:  [id number]          PATIENT TYPE:  INP   LOCATION:  5710                         FACILITY:  [facility]   PHYSICIAN:  Darden Palmer., M.D.DATE OF BIRTH:  Sep 10, 1926   DATE OF ADMISSION:  07/21/2004  DATE OF DISCHARGE:  07/26/2004                                 DISCHARGE SUMMARY   DISCHARGE DIAGNOSES:  1.  Coronary artery disease      1.  Patent internal mammary graft to the obtuse marginal and patent          right internal mammary graft to the left anterior descending          coronary artery.      2.  Residual atherosclerotic disease with moderately-severe stenosis          involving the mid-portion of the right coronary artery and          moderately-severe stenosis involving the previous stent to the left          main coronary artery.  2.  Intra-abdominal hematoma following a cardiac catheterization, resolved.  3.  Hyperlipidemia.  4.  Hypertension.  5.  Peripheral vascular disease.  6.  Anxiety.   PROCEDURE:  1.  A cardiac catheterization.  2.  Repair of left iliac artery by Dr. Janetta Hora. Fields.   HISTORY OF PRESENT ILLNESS:  This 77 year old female has a history of  previous bypass grafting.  Her last bypass graft was a right internal  mammary graft to the LAD in 1999.  She has atypical chest pain, is difficult  to assess, and has some dyspnea on exertion.  She has some anxiety and  longstanding hypertension.  Because of atypical chest pain, and the fact  that her grafts were old, she had a recent Cardiolite scan showing striking  ST depression and T-wave inversions with adenosine.  She had anterolateral  and anterior ischemia noted.  It was recommended that she undergo a repeat  cardiac catheterization to assess the status of her grafts.  Please see the  previously dictated history and physical for the remainder of the details.   HOSPITAL COURSE:  The patient was brought in for a  same-day cardiac  catheterization.  It was done through the left groin, because of previous  extensive surgery on the right groin.  The catheterization went well and the  internal mammary grafts were demonstrated to be patent.  She had a  moderately-severe 60%-70% stenosis involving the previous stent in the left  main coronary artery prior to the distal circumflex, and also had a mid-60%  to 70% stenosis involving the right coronary artery.  The left ventricular  function was normal.  In the holding area, she developed a significant  hematoma following the catheterization which was intra-abdominal.  She had  no external bleeding, but because of some hypotension, was given fluids.  Dr. Darrick Penna saw her in consultation.  A CT scan showed a large rectus sheath  hematoma, which extended up into the anterior abdominal wall.  The patient  remained unstable following this and was taken  to the operating room where  she underwent a repair of the previous left iliac artery.  It was thought  that the patient's unusual presentation is due to the fact that she had the  previous surgery in this groin also, and that the scarring had set up planes  along the fascia that lead to this distribution of the blood flow.  She was  observed in the step-down unit following this, and then was able to be  transferred to the floor.  She had some erythema that was treated with Ancef  and then later changed to Keflex 500 mg q.i.d.  She gradually improved, but  because she lived alone, it was felt that she would need therapy to treat  her deconditioning.   CONDITION ON DISCHARGE:  She is discharged at this time in an improved  condition.   DISCHARGE MEDICATIONS:  1.  Aspirin 81 mg daily.  2.  Cozaar 50 mg daily.  3.  Neurontin 300 mg b.i.d.  4.  Hydralazine 50 mg b.i.d.  5.  Lasix 20 mg b.i.d.  6.  Plavix 75 mg daily.  7.  Protonix 40 mg daily.  8.  Remeron 15 mg q.h.s.  9.  Zetia 10 mg daily.  10. Zocor 40 mg  daily.  11. Altace 10 mg daily.  12. Metoprolol 100 mg daily.  13. Keflex 500 mg q.i.d.   DISPOSITION:  She is to be discharged to the [facility] for further rehab.  The  plan is to treat her coronary artery disease medically unless she has  worsening symptoms.      WST/MEDQ  D:  07/26/2004  T:  07/26/2004  Job:  [id number]   cc:   Donia Guiles, M.D.  [address] [place]  [place] [address]  Fax: [phone]

## 2010-10-07 NOTE — Discharge Summary (Signed)
[place]  Patient:    [name], [name]                   MRN: [id number] Adm. Date:  [id number] Disc. Date: [id number] Attending:  Annamarie Dawley                           Discharge Summary  HISTORY OF PRESENT ILLNESS:  For details of her history, please see her written H&P.  In brief, this is a 77 year old white female who presented with abdominal pain, nausea, vomiting, and diarrhea that appeared to be atypical for gallbladder disease.  She had a significant cardiac history.  HOSPITAL COURSE:  She underwent a complete cardiac and GI evaluation and was found to have gallstones.  She underwent laparoscopic cholecystectomy. Postoperatively, she did well.  She did have a couple of days worth of nausea and vomiting that complicated her postoperative course, but she continued to improve and on January 19, 2000, she was ready for discharge home.  DISCHARGE MEDICATIONS:  Her medications at the time of discharge include all of her home medicines, as well as Vicodin for pain.  ACTIVITY LEVEL:  She is to do no heavy lifting.  DIET:  She is instructed to eat a low-fat diet.  WOUND CARE:  She has Steri-Strips on.  She may shower and get these wet.  FOLLOW-UP:  She is to follow up in two weeks.  DISPOSITION:  She is discharged to home. DD:  02/17/00 TD:  02/18/00 Job: [id number]

## 2010-10-07 NOTE — Cardiovascular Report (Signed)
[name], [name]            ACCOUNT NO.:  [id number]   MEDICAL RECORD NO.:  [id number]          PATIENT TYPE:  INP   LOCATION:  2899                         FACILITY:  [facility]   PHYSICIAN:  Darden Palmer., M.D.DATE OF BIRTH:  [birth year]   DATE OF PROCEDURE:  07/21/2004  DATE OF DISCHARGE:                              CARDIAC CATHETERIZATION   HISTORY:  A 77 year old female with previous bypass grafting in 1981, 1982  and again in 1987.  She had an internal mammary bypass to the LAD for a  diseased saphenous vein graft in 1999.  She had repeat Cardiolite done  recently showing significant ST depression with Adenosine and an area of  ischemia in the anterolateral wall with some scar also noted.   PROCEDURE:  Left heart catheterization with coronary angiogram, left  ventriculogram, angiography of both internal mammary arteries.  The patient  tolerated the procedure well without difficulty.  Left groin used becuase of  prior surgery to the right groin. She had Versed 1 mg given during the  procedure.  The internal mammary arteries were visualized using internal  mammary artery catheter.  The right coronary artery had some catheter  ventricularization and damping and was shot with a 3.5 Judkins.  She  tolerated procedure welland was stable leaving the cath lab without hematoma  or pain.  In the holding area she developed a large abdominal wall hematoma  and required surgical repair of the left femoral artery.   HEMODYNAMIC DATA:  Aorta post contrast 167/65.  LV post contrast 167/1 to  13.   ANGIOGRAPHIC DATA:  Left ventriculogram:  Performed in the 30 degree RAO  projection.  The aortic valve was normal.  The mitral valve was normal.  There was an area of high lateral akinesis noted.  The remaining segments  were vigorous and the estimated ejection fraction is 60%.  Coronary arteries  arise and distribute normally.  There is significant calcification noted in  the proximal  left coronary system as well as the proximal right.  The left  main coronary artery is calcified with the previous stent seen and there is  diffuse 50 to 60% narrowing diffusely within the left main.  The left  anterior descending has a severe ostial stenosis, then diffusely diseased  and subtotally occluded distally.  There is a severe stenosis after the  septal perforator. The previous diagonal branch was occluded to this.  The  circumflex coronary artery is patent with diffuse disease involving the  first marginal branch, the second marginal branch is occluded, the third and  fourth marginal branches are patent with mild diffuse disease.  The right  coronary artery has diffuse disease in the mid portion of the vessel and has  eccentric 60 to 70% stenosis in the mid portion of the vessel.  A small  posterior descending artery is less than 2 mm and has a proximal 80%  stenosis.   Left internal mammary graft to the second marginal branch is widely patent.   Right internal mammary graft to the previous LAD is widely patent with a  patent distal anastomotic site and  no evidence of stenosis.   IMPRESSION:  1.  Patent left internal mammary graft to the second marginal branch and      right internal mammary graft to the left anterior descending.  2.  Severe native three vessel coronary artery disease with in-stent      restenosis involving the left main stent of 60%, a moderately severe      stenosis involving the mid portion of the right coronary artery and a      diffusely diseased posterior descending artery.  3.  Preserved left ventricular function with high lateral  hypokinesis.   RECOMMENDATIONS:  Review of films in comparison with old films depending on  symptoms, may continue to medical therapy or if symptoms worsen, could  consider repeat stenting of the left main stent and the right coronary  artery.      WST/MEDQ  D:  07/21/2004  T:  07/21/2004  Job:  [id number]   cc:   Donia Guiles, M.D.  [address] [place]  [place] [address]  Fax: [phone]

## 2010-10-07 NOTE — H&P (Signed)
[name], [name]            ACCOUNT NO.:  [id number]   MEDICAL RECORD NO.:  [id number]          PATIENT TYPE:  ORB   LOCATION:  4526                         FACILITY:  [facility]   PHYSICIAN:  Erick Colace, M.D.DATE OF BIRTH:  1927/05/11   DATE OF ADMISSION:  07/26/2004  DATE OF DISCHARGE:                                HISTORY & PHYSICAL   HISTORY OF PRESENT ILLNESS:  This is a 77 year old female with a history of  coronary artery disease, status post coronary artery bypass graft surgery,  with stent placements in 1981, 1982, 1987, and 1999.  She was admitted on  July 21, 2004, with increased shortness of breath and atypical chest pain.  She underwent a cardiac catheterization on July 21, 2004, without any  evidence of significant vessel occlusion.  She had increased pain and  swelling in the left groin post-catheterization, and a CAT scan showed a  large anterior abdominal wall hematoma.  She underwent a repair of it.  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  She had a JP drain  placed, which was just removed on July 25, 2004.  She has Keflex ordered for  wound coverage until August 07, 2004.  She became deconditioned and had some  decreased ADL's and mobility, also associated with pain.  Because of her  social situation, where she lives alone, she was transferred to the subacute  care unit so that she may receive additional PT and OT, to have greater  functional status and allow her to return to home.   REVIEW OF SYSTEMS:  Is positive for shortness of breath and cough.  Positive  for GI reflux.  Positive for lumbago.  Positive for weakness in the lower  extremities, anxiety and depression.   PAST MEDICAL/SURGICAL HISTORY:  1.  As noted above, she has had multiple stents placed.  2.  Hypertension.  3.  Non-insulin-dependent diabetes mellitus.  4.  Right carotid endarterectomy.  5.  History of anxiety and depression.  6.  History of gastroesophageal  reflux disease.  7.  A history of Bell's palsy.  8.  History of an appendectomy.  9.  Hysterectomy.  10. Cholecystectomy.  11. Hyperlipidemia.   FAMILY HISTORY:  Hypertension and coronary artery disease.   SOCIAL HISTORY:  Lives alone in [place] in a one-level home with four  steps to enter.  Her son is in the area and works.   PRIOR FUNCTIONAL STATUS:  Independent.   CURRENT FUNCTIONAL STATUS:  Minimal assist bed mobility and transfers.  Gait  is 25 feet with a rolling walker.   MEDICATIONS:  1.  Altace 10 mg p.o. daily.  2.  Aspirin 81 mg p.o. daily.  3.  Cozaar 50 mg p.o. b.i.d.  4.  Neurontin 300 mg p.o. b.i.d.  5.  Hydralazine 50 mg p.o. b.i.d.  6.  Lasix 20 mg p.o. b.i.d.  7.  Plavix 75 mg p.o. daily.  8.  Protonix 40 mg p.o. daily.  9.  Remeron 15 mg p.o. q.h.s.  10. Zetia 10 mg p.o. daily.  11. Zocor 40 mg p.o. daily.  12.  Toprol XL 100 mg p.o. daily.  13. Keflex 500 mg p.o. t.i.d. until August 05, 2004.  14. She is on insulin sliding scale.  15. The p.r.n. pain medications include oxycodone 5 mg, one to two tab p.o.      q.6h. p.r.n.  16. Tylenol 325 mg, one to two tab p.o. q.4-6h. p.r.n.  17. Trazodone 25 mg to 50 mg p.o. q.h.s. p.r.n. sleep.  18. Phenergan p.r.n. for nausea.   ALLERGIES:  Not known.   LABORATORY DATA:  The last CBC showed a hemoglobin of 11.4, white count  10.8.  Her last BUN was 13 and creatinine 0.9.  CBG's have been running  mainly in the low 100's.   PHYSICAL EXAMINATION:  VITAL SIGNS:  Blood pressure 143/49, pulse 80,  respirations 18, temperature 99.0 degrees.  GENERAL:  An elderly female appearing somewhat younger than her stated age.  She is in no acute distress.  She is oriented to person, to [employer], but not to which floor.  She is oriented to year, but not  to month and date.  She remembers 1/3 objects after a two-minute delay.  HEENT:  Eyes are anicteric, not injected.  External ENT exam is normal.   NECK:  Supple without adenopathy.  LUNGS:  Respiratory effort is good.  Lungs are clear to auscultation.  HEART:  A regular rate and rhythm.  No murmurs, rubs, or extra sounds.  ABDOMEN:  Positive bowel sounds, soft, nontender to palpation.  EXTREMITIES:  Her left groin has a JP drain site that is mildly erythematous  but no drainage.  Her extremities are without clubbing, cyanosis or edema.  She has good pedal pulses bilaterally.  She has normal sensation in the  bilateral lower extremities.  She has 5/5 strength in the bilateral upper  extremities, with 3- bilateral hip flexors, 3/5 bilateral quads, 4 bilateral  tibia anterior and gastrocnemius.  NEUROLOGIC:  Her mood and affect are without lability or agitation.  She is  somewhat slow in her responses.   IMPRESSION/PLAN:  1.  Deconditioning after a left groin hematoma.  2.  She had a repair of external iliac artery on the left side.  3.  She had an abdominal wall hematoma.  Pain management:  Will use Oxy-IR.      This may be contributing to some minor cognitive deficits.  I would      expect this to improve as the dosage goes down.  4.  Coronary artery disease, with history of coronary artery bypass graft      surgery, as well as multiple stent placements:  Continue aspirin and      Plavix.  5.  Hypertension:  Continue Cozaar and Lasix, hydralazine and Toprol.  6.  Non-insulin-dependent diabetes mellitus:  Continue CBG's and sliding      scale insulin.  7.  Depression and anxiety:  Continue Remeron.  8.  Gastroesophageal reflux disease:  Continue Protonix.  9.  Hyperlipidemia:  Continue Zetia and Vytorin.   The estimated length of stay is seven to 10 days.  The patient is a good  rehab candidate.  The goal is to return to home at a modified independent  level with the son intermittently checking up on her.      AEK/MEDQ  D:  07/26/2004  T:  07/26/2004  Job:  [id number]  cc:   Darden Palmer., M.D.  [address]  [address]  [place]  [place] [address]  Fax: [phone]   Janetta Hora. Fields, MD  [address], [place] [address]   Donia Guiles, M.D.  [address] [place]  [place] [address]  Fax: [phone]   Everardo All. Madilyn Fireman, M.D.  [address]  [place]  [place] [address]  Fax: [phone]

## 2010-10-07 NOTE — Discharge Summary (Signed)
[name], [name]            ACCOUNT NO.:  [id number]   MEDICAL RECORD NO.:  [id number]          PATIENT TYPE:  OBV   LOCATION:  1615                         FACILITY:  [facility]   PHYSICIAN:  Theone Stanley, MD   DATE OF BIRTH:  Jan 15, 1927   DATE OF ADMISSION:  05/11/2006  DATE OF DISCHARGE:  05/12/2006                               DISCHARGE SUMMARY   ADMISSION DIAGNOSIS:  1. Drug reaction secondary to clindamycin.  2. Diabetes.  3. Hypertension.  4. History of coronary disease.   DISCHARGE DIAGNOSIS:  1. Drug reaction secondary to clindamycin.  2. Diabetes.  3. Hypertension.  4. History of coronary disease.   CONSULTATIONS:  None.   PROCEDURES AND DIAGNOSTIC TESTS:  None.   HOSPITAL COURSE:  [name] is a very pleasant 77 year old female  who recently had a tooth extraction and was placed on clindamycin.  She  was given a total of 28 pills and she only has about four pills left in  the bottle.  She started to develop a rash on her neck which  subsequently has spread all over her body.  She complained of some  shortness of breath and possible tightness in the throat.  The patient  was admitted for 23 hour observation.  She was placed on steroids, H2  blocker, and Benadryl.  She was continued on all her medications for  diabetes, cardiac, and pressure medications.  The patient did well  overnight.  When I talked to the patient, she stated that her sensation  had improved.  She still had some itchiness and has evidence of  urticaria present, although her symptoms have improved on her regimen.  I talked to the patient in regards to medications.  At first, she did  not want to go on the steroids because she did not want to become  bloated.  The patient seemed concerned about the medications and the  costs, she states that she is on a fixed income and her current  medications cost quite a bit.  I talked to her about programs at [facility]  and [employer] for $4 and told her  that what I will be placing her on will  be a temporary measure until her symptoms resolve.  I did indicate that  the rash might be there for several days and possibly even a week until  it completely resolves.  She understood this.   DISCHARGE MEDICATIONS:  Aspirin 81 mg one p.o. daily, Plavix 75 mg one  p.o. daily, Altace 10 mg one p.o. daily, Amaryl 2 mg one p.o. daily,  Cozaar 50 mg b.i.d., Hydralazine 50 mg b.i.d., Toprol XL 50 mg daily,  Mirtazapine 15 mg q.h.s., Neurontin 300 mg b.i.d., prednisone 40 mg one  p.o. daily for three days, Benadryl 25-50 mg one p.o. q.6h. p.r.n.,  Zantac 150 mg one p.o. daily.   DISCHARGE INSTRUCTIONS:  The patient is to follow up with Dr. Arvilla Market  in 1-2 weeks and Dr. Tresa Endo as previously set up.   CONDITION ON DISCHARGE:  improved  Patient was not discharged on this day since she had a NSTEMI. Follow up  discharge summary will be dictated on her discharge.      Theone Stanley, MD  Electronically Signed     AEJ/MEDQ  D:  05/12/2006  T:  05/12/2006  Job:  [id number]   cc:   Donia Guiles, M.D.  Fax: [phone]   Nicki Guadalajara, M.D.  Fax: [phone]

## 2010-10-07 NOTE — Consult Note (Signed)
NAMEKANON, COLUNGA            ACCOUNT NO.:  [id number]   MEDICAL RECORD NO.:  [id number]          PATIENT TYPE:  INP   LOCATION:  3710                         FACILITY:  [facility]   PHYSICIAN:  [name], M.D.  DATE OF BIRTH:  Aug 15, 1926   DATE OF CONSULTATION:  09/14/2006  DATE OF DISCHARGE:                                 CONSULTATION   REQUESTING PHYSICIAN:  Georga Hacking, M.D.   REASON FOR CONSULTATION:  Further evaluation and treatment of anxiety,  insomnia, as well as reassessment of capacity due to a history of  cognitive impairment.   HISTORY OF PRESENT ILLNESS:  Ms. Haselton is a 77 year old female  admitted to [facility] on September 13, 2006, due to back pain.  Ms. Furniss continues to have insomnia and feeling on edge as well as  some slight excess worry.  Her memory ability has greatly improved since  her admission in December.  She has short term recall ability.  She is  not having any delusions or hallucinations.  She describes a normal  interest in TV programs, music, and visits.  She denies depressed mood.  She is cooperative with care.   PAST PSYCHIATRIC HISTORY:  Ms. Scrivener has suffered from depression in  the past.  She has been treated with trazodone up to 200 mg q.h.s. for  both depression and anxiety.  She also underwent a Remeron trial of 15  mg q.h.s.  The Remeron was continued through 2006, and on up to December  2007, when it was discontinued.  The patient was tried on Celexa,  however, she developed some nausea and the Celexa was discontinued.  However, the patient's mood did recover after treatment of her  congestive heart failure and in-stent restenosis of her left main  coronary artery.   She has not been taking any definitive antidepressant medication since  January.   FAMILY PSYCHIATRIC HISTORY:  None known.   SOCIAL HISTORY:  Marital status:  Widowed.  The patient has two  children.  She does not use alcohol.  She does  not use illegal drugs.  She is occupationally retired.  She lives alone.   GENERAL MEDICAL PROBLEMS:  1. Coronary artery disease with history of CABG, stent placement.  2. History of congestive heart failure.  3. Gastroesophageal reflux disease.  4. Type 2 diabetes mellitus.  5. Hyperlipidemia.  6. Hypertensive heart disease.   SURGICAL HISTORY:  1. Cholecystectomy.  2. Hysterectomy.  3. Appendectomy.  4. Right carotid endarterectomy.  5. Bypass grafting.  6. She also has had a removal of a lymph fistula in her right leg.   MEDICATIONS:  Her MAR is reviewed.  She is not on any psychotropic  medication.   ALLERGIES:  CLINDAMYCIN.   LABORATORY DATA:  WBC 8.5, hemoglobin 11.6, platelet count 260.  BUN is  23, creatinine 1.03.  SGOT 21, SGPT 17, albumin 4.2, calcium 8.9.  Hemoglobin A1c is within normal limits at 5.6.  TSH is within normal  limits at 3.364.  The patient's recent assay of B12 and folic acid  levels were within normal limits in late December  2007.   An MR of the brain without contrast in late December 2007, showed no  evidence of acute or reversible process.  There was mild chronic small  vessel change affecting the brain, less than is often seen in healthy  individuals of that age.   REVIEW OF SYSTEMS:  CONSTITUTIONAL:  Afebrile.  HEAD:  No trauma.  EYES:  No visual changes.  EARS:  No hearing impairment.  NOSE:  No rhinorrhea.  MOUTH/THROAT:  No sore throat.  NEUROLOGIC:  As above.  PSYCHIATRIC:  As  above.  CARDIOVASCULAR:  No current chest pain.  RESPIRATORY:  No cough.  GASTROINTESTINAL:  No nausea, vomiting, diarrhea.  GENITOURINARY:  No  dysuria.  SKIN:  Unremarkable.  MUSCULOSKELETAL:  Unremarkable.  ENDOCRINE/METABOLIC:  As above.  HEMATOLOGIC/LYMPHATIC:  Mild anemia.   PHYSICAL EXAMINATION:  VITAL SIGNS:  Temperature 98.1, pulse 57,  respirations 17, blood pressure 137/63, O2 saturation on room air 98%.  MENTAL STATUS:  Ms. Cirillo is an elderly  female appearing her  chronologic age, partially reclined in a supine position in her hospital  bed with good eye contact.  She is well groomed and socially  appropriate.  On orientation testing, she knows the year, the month, the  day of the month, day of the week, place, and person.  On memory  testing, she names 3 out of 3 immediate, and 2 out of 3 at five minutes.  Her fund of knowledge and intelligence are within normal limits.  Her  speech involves normal rate and prosody.  Her concentration is within  normal limits.  Her affect is mildly anxious.  Her mood is within normal  limits.  Thought process:  Logical, coherent, goal-directed.  No  looseness of association.  Thought content:  No thoughts of harming  herself.  No thoughts of harming others.  No delusions.  No  hallucinations.  Insight is good.  Judgment is intact.   ASSESSMENT:   AXIS I:  1. Anxiety disorder, 293.84, not otherwise specified.  2. Delirium, 293.00, not otherwise specified.  The patient appears to      have had a multifactorial impairment in her cognitive function in      late 2007, that has now resolved.  Factors could have included      hypoperfusion of the brain due to her congestive heart failure that      was improved with treatment and re-stenting.  Also, she may have      had a lingering side effect from the Remeron.  Other factors      contributing would include partial effects from diabetes, and mild      anemia, and other general medical conditions.  3. Mood disorder, 293.83, not otherwise specified with possible      contributions from her general medical problems, now improved.   AXIS II:  None.   AXIS III:  See general medical problems.   AXIS IV:  General medical.   AXIS V:  Fifty-five.   As above, Ms. Ketterman has greatly improved in her mental status exam  and cognitive functioning.  She does have the ability to make a consistent choice.  She can differentiate between her options and  the  associated risk versus benefits.  She can appreciate the risks of  exacerbations of her general medical problems as they apply directly to  her, and she can reason well.  She does have the capacity for informed  consent.   The indications, alternatives, and adverse  effects of trazodone were  discussed with the patient for anti-insomnia as well as anti-anxiety.  The patient understands the above information and would like to restart  trazodone.   RECOMMENDATIONS:  1. Would start trazodone at 25 mg to 50 mg q.h.s.  2. Although the patient was able to recall 2 out of 3 visual objects,      she still does have some mild verbal short term memory impairment,      therefore, would consider Aricept and/or Namenda.  3. Would have this patient follow up with one of the neurology groups      locally or one of the psychiatric clinics attached to [facility] or [facility] or [facility] for further management of her mild memory dysfunction as      well as anxiety.      [name], M.D.  Electronically Signed     JW/MEDQ  D:  09/15/2006  T:  09/15/2006  Job:  [id number]

## 2010-10-07 NOTE — Op Note (Signed)
NAMEVICTORIOUS, COSIO                      ACCOUNT NO.:  [id number]   MEDICAL RECORD NO.:  [id number]                   PATIENT TYPE:  INP   LOCATION:  2308                                 FACILITY:  [facility]   PHYSICIAN:  Balinda Quails, M.D.                 DATE OF BIRTH:  1926-11-17   DATE OF PROCEDURE:  01/08/2002  DATE OF DISCHARGE:                                 OPERATIVE REPORT   PREOPERATIVE DIAGNOSES:  1. Severe right internal carotid artery stenosis.  2. Right amaurosis fugax.   POSTOPERATIVE DIAGNOSES:  1. Severe right internal carotid artery stenosis.  2. Right amaurosis fugax.   PROCEDURE:  Right carotid endarterectomy with Dacron patch angioplasty.   SURGEON:  Balinda Quails, M.D.   ASSISTANTS:  Caralee Ates, M.D., and Toribio Harbour, R.N.R.A.   ANESTHESIA:  General anesthesia.   ANESTHESIOLOGIST:  Guadalupe Maple, M.D.   CLINICAL NOTE:  This is a 77 year old female with a history of severe  atherosclerotic coronary artery disease.  She has previously undergone  multiple open heart procedures and percutaneous coronary interventions.  She  presented to the office with symptoms of right amaurosis fugax and a Doppler  finding of severe right internal carotid artery stenosis.  It was  recommended that the patient undergo right carotid endarterectomy for  reduction of stroke risk.  The patient consented for surgery.  The risk and  benefits of the operative procedure were explained to the patient in detail,  including major morbidity and mortality of approximately 1-2% to include but  not limited to MI, CVA, and death.   DESCRIPTION OF PROCEDURE:  The patient brought to the operating room in  stable condition, where she was placed in the supine position.   General endotracheal anesthesia induced.  Foley catheter and arterial line  inserted.  Right neck prepped and draped in a sterile fashion.   Curvilinear skin incision made along the anterior border of the  right  sternomastoid muscle.  Dissection carried down through the subcutaneous  tissue with electrocautery.  The platysma incised.  Deep dissection carried  down along the anterior border of the sternomastoid and carotid sheath.  The  omohyoid muscle was divided proximally in the neck.  The common carotid  artery mobilized proximally and encircled with a vessel loop.  The common  carotid artery revealed extensive plaque, and further proximal mobilization  was carried down to the sternal notch, where the artery was less diseased.  Distal disease then carried up to the carotid bifurcation, where the  superior thyroid and external carotid were encircled with a vessel loop.  The internal carotid artery followed distally up to the posterior belly of  the digastric muscle and encircled with a vessel loop.   The carotid bifurcation revealed disease with calcified plaque extending  into the origin of the right internal carotid artery.   The patient was administered 7000 units of  heparin intravenously.  Adequate  circulation time permitted.  The carotid vessels were controlled with  clamps.  A longitudinal arteriotomy made in the distal common carotid  artery.  The arteriotomy extended across the carotid bulb and up into the  internal carotid artery.  There was an ulcerated plaque at the origin of the  right internal carotid artery and a 75-80% stenosis.  A shunt was inserted.   The endarterectomy then carried proximally down into the common carotid  artery to the level of the sternal notch, where the space-occupying plaque  was tapered and attenuated.   An endarterectomy elevator was used to remove the plaque.  The  endarterectomy was carried down into the common carotid artery, where the  plaque was divided transversely with Potts scissors.  The plaque then raised  up into the bulb, where the superior thyroid and external carotid were  endarterectomized using an eversion technique.  The  internal carotid plaque  then feathered out distally well.  Fragments of plaque were removed with  plaque forceps.  The site irrigated with heparin and saline solution.   There was a tear in the common carotid artery at the proximal endarterectomy  margin.  This was repaired with interrupted 7-0 Prolene suture.  The quality  of the artery was good.   A patch angioplasty with a Hemashield Dacron patch was then carried out  throughout the length of the endarterectomy site with a running 6-0 Prolene  suture.  At the completion of this, all the vessels were well-flushed and  the shunt was removed.  Clamps were then removed.  There was some bleeding,  however, from the repair site in the common carotid artery.  The clamps were  then replaced and the common carotid artery repaired with interrupted 6-0  Prolene suture with pledgets.  The endarterectomy site then irrigated again  with heparin and saline.  The patch angioplasty closed and the initial  antegrade flow was directed up the external carotid artery, following this  the internal carotid was released.   There was an excellent pulse and Doppler signal in the distal internal  carotid artery.  The patient administered 50 mg protamine intravenously.  Adequate hemostasis obtained.  The sponge and instrument counts were  correct.   The sternomastoid fascia then closed with running 2-0 Vicryl suture.  Platysma closed with running 3-0 Vicryl suture.  The skin closed with 4-0  Monocryl.  Half-inch Steri-Strips applied.  Sterile dressing applied.   Anesthesia was reversed in the operating room, the patient awakened readily,  and moved all extremities to command.  Transferred to the recovery room in  stable condition with no apparent complications.                                                Balinda Quails, M.D.    PGH/MEDQ  D:  01/08/2002  T:  01/11/2002  Job:  [id number]  cc:   Darden Palmer., M.D.

## 2010-10-07 NOTE — Consult Note (Signed)
[name], MEALOR NO.:  [id number]   MEDICAL RECORD NO.:  [id number]          PATIENT TYPE:  INP   LOCATION:  3710                         FACILITY:  [facility]   PHYSICIAN:  [name], M.D.  DATE OF BIRTH:  [birth year]   DATE OF CONSULTATION:  09/14/2006  DATE OF DISCHARGE:                                 CONSULTATION   Ms. Tedra Coupe was transferred from [facility] to [facility] for her continued cardiac evaluation and treatment.  She  persists with her cognitive dysfunction as described before.  Her mood,  however, is improved.  She is less depressed.  She is not having any  thoughts of harming herself.  She has no delusions or hallucinations.   MENTAL STATUS EXAM:  As above.  Thought process:  Coherent.  The patient  does not recall the undersigned.  Thought content:  She has no thoughts  of harming herself or others.  She has no delusions or hallucinations.  She is pleasant but has a slightly flat prosody on speech.  Her judgment  remains impaired.   ASSESSMENT:   AXIS I:  1. Mood disorder, 293.83, not otherwise specified (functional and      general medical factors).  2. Major depression.  3. Rule out delirium, 293.00, not otherwise specified versus dementia      not otherwise specified.   AXIS III:  The Celexa has been discontinued due to patient complaining  of nausea.   RECOMMENDATIONS:  Would avoid anticholinergic medication.   The undersigned will follow up to reassess depressive symptoms and need  for an additional trial of antidepressant.      [name], M.D.  Electronically Signed     JW/MEDQ  D:  09/15/2006  T:  09/15/2006  Job:  [id number]

## 2010-10-07 NOTE — Discharge Summary (Signed)
NAMEYARETSI, HUMPHRES            ACCOUNT NO.:  [id number]   MEDICAL RECORD NO.:  [id number]          PATIENT TYPE:  ORB   LOCATION:  4526                         FACILITY:  [facility]   PHYSICIAN:  Erick Colace, M.D.DATE OF BIRTH:  Sep 26, 1926   DATE OF ADMISSION:  07/26/2004  DATE OF DISCHARGE:  08/10/2004                                 DISCHARGE SUMMARY   DISCHARGE DIAGNOSES:  1.  Deconditioning after repair of external iliac artery pseudoaneurysm,      July 21, 2004, with left groin hematoma.  2.  Pain management.  3.  Coronary artery disease with coronary artery bypass grafting and      multiple percutaneous transluminal coronary angioplasties.  4.  Hypertension.  5.  Non-insulin-dependent diabetes mellitus.  6.  Right carotid endarterectomy.  7.  Depression.  8.  Anxiety.  9.  Gastroesophageal reflux disease.  10. Hyperlipidemia.   HISTORY OF PRESENT ILLNESS:  This is a 77 year old white female with a  history of coronary artery disease, coronary artery bypass grafting in '81  with multiple PTCAs, who was admitted July 21, 2004 with increased shortness  of breath and atypical chest pain.  She underwent diagnostic cardiac  catheterization, July 21, 2004, per Dr. Donnie Aho that was unremarkable.  Post  catheterization, with increased pain and swelling of the left groin.  Cardiothoracic Surgery, Dr. Darrick Penna.  CT showed a large anterior abdominal  wall hematoma and underwent repair of external iliac artery pseudoaneurysm,  July 21, 2004; JP drain discontinued July 25, 2004.  Keflex for wound  coverage added until August 07, 2004 and stopped.  The patient was admitted  to subacute care services.   PAST MEDICAL HISTORY:  See discharge diagnoses.   ALLERGIES:  None.   SOCIAL HISTORY:  Lives alone in [place] home, 4 steps to entry.  Son in areas works.  Denies alcohol; the patient is a remote smoker.   MEDICATIONS PRIOR TO ADMISSION:  1.  Altace 5 mg daily.  2.   Amaryl 1 mg daily.  3.  Aspirin 81 mg daily.  4.  Cozaar 50 mg twice daily.  5.  Neurontin 100 mg twice daily.  6.  Hydralazine 500 mg twice daily.  7.  Lasix 20 mg twice daily.  8.  Plavix 75 mg daily.  9.  Prilosec 20 mg daily.  10. Remeron 15 mg daily.  11. Toprol-XL 50 mg daily.  12. Vytorin 10/40 daily.   HOSPITAL COURSE:  Patient with progressive gains while on rehab services  with therapies initiated daily.  The following issues were followed during  the patient's rehab course:  Pertaining to Mrs. Faddis's left groin  hematoma after repair of external iliac artery pseudoaneurysm, July 21, 2004, site healing nicely with cardiothoracic surgery followup, but she  would continue to see after discharge.  Overall conditioning and well-being  again continued to improve nicely with the benefit of subacute acute  services.  She was ambulating at supervision level with a rolling walker.  Pain management ongoing with the use of oxycodone, which she used on a  limited basis.  She had knee-high  support hose on for deep venous thrombosis  prophylaxis as her calves remained cool without any swelling or erythema and  nontender.  She had no chest pain or shortness of breath throughout her  rehab course, with multiple PTCAs in the past; she received followup as  needed per cardiology services, Dr. Donnie Aho.  Blood pressure was controlled  on home regimens of Cozaar, Lasix, hydralazine and Toprol with diastolic  pressures 64-72; she denied any headache or dizziness.  Blood sugar was well-  controlled with low-dose Amaryl, which the patient was on prior to  admission, blood sugars 95, 77, 123.  She remained on her Remeron for a  documented history of depression.  She was upbeat, cooperative with her  therapies, appetite continued to improve as her endurance improved, she had  no bowel or bladder disturbances.   Latest labs showed a sodium of 138, potassium 4.2, BUN 30, creatinine 1.3;   hemoglobin 11.4, hematocrit 32.8.   DISCHARGE MEDICATIONS:  Discharge medications at time of dictation included:  1.  Aspirin 81 mg p.o. daily.  2.  Plavix 75 mg p.o. daily.  3.  Vytorin daily.  4.  Lasix 20 mg twice daily.  5.  Neurontin 300 mg twice daily.  6.  Hydralazine 50 mg twice daily.  7.  Cozaar 50 mg twice daily.  8.  Remeron 15 mg at bedtime.  9.  Protonix 40 mg daily.  10. Toprol-XL 100 mg daily.  11. Altace 10 mg daily.  12. Amaryl 1 mg daily.  13. Oxycodone as needed -- pain.   ACTIVITY:  Activity as tolerated.   DIET:  Diet was a diabetic diet.   SPECIAL INSTRUCTIONS:  Home health therapies, no driving.   FOLLOWUP:  She should follow up with Dr. Darrick Penna, Cardiothoracic Surgery, as  advised; Dr. Donnie Aho, Cardiology Services, as needed.      DA/MEDQ  D:  08/09/2004  T:  08/09/2004  Job:  [id number]   cc:   Donia Guiles, M.D.  [address] [place]  [place] [address]  Fax: [phone]   Janetta Hora. Fields, MD  [address], [place] [address]   W. Ashley Royalty., M.D.  [address], [address]  [place]  [place] [address]  Fax: [phone]   Everardo All. Madilyn Fireman, M.D.  [address]  [place]  [place] [address]  Fax: [phone]

## 2010-10-07 NOTE — Procedures (Signed)
. [facility]  Patient:    [name], [name] Visit Number: [id number] MRN: [id number]          Service Type: END Location: ENDO Attending Physician:  Louie Bun Dictated by:   Everardo All Madilyn Fireman, M.D. Proc. Date: 08/27/01 Admit Date:  08/27/2001 Discharge Date: 08/27/2001   CC:         Desma Maxim, M.D.   Procedure Report  PROCEDURE PERFORMED:  Esophagogastroduodenoscopy with biopsy.  ENDOSCOPIST:  Everardo All. Madilyn Fireman, M.D.  INDICATIONS FOR PROCEDURE:  Severe reflux symptoms not controlled by Nexium in a patient undergoing screening colonoscopy today.  DESCRIPTION OF PROCEDURE:  The patient was placed in the left lateral decubitus position and placed on the pulse monitor with continuous low flow oxygen delivered by nasal cannula.  She was sedated with 75 mg IV Demerol and 7.5 mg IV Versed for the previous colonoscopy.  No further sedation was required for this procedure.  The Olympus video endoscope was advanced under direct vision into the oropharynx and esophagus.  The esophagus was straight and of normal caliber with the squamocolumnar line at 38 cm above a 2 cm hiatal hernia.  There was no ring or stricture.  The stomach was entered and a small amount of liquid  secretions was suctioned from the fundus.  Retroflex view of the cardia was unremarkable.  The fundus, body, antrum and pylorus all appeared normal.  The duodenum was entered and both the bulb and second portion were well inspected and appeared to be within normal limits.  The scope was withdrawn back into the stomach and CLO test obtained.  The scope was then withdrawn and the patient returned to the recovery room in stable condition.  The patient tolerated the procedure well and there were no immediate complications.  IMPRESSION:  Small hiatal hernia.  Otherwise normal endoscopy.  PLAN:  Increase Nexium to b.i.d.  Follow up in the office and will reinforce dietary and  lifestyle modifications for reflux.  Will consider treatment for eradication of Helicobacter pylori if CLO test positive. Dictated by:   Everardo All Madilyn Fireman, M.D. Attending Physician:  Louie Bun DD:  08/27/01 TD:  08/27/01 Job: [id number]

## 2010-10-07 NOTE — H&P (Signed)
NAMENARDOS, PUTNAM            ACCOUNT NO.:  [id number]   MEDICAL RECORD NO.:  [id number]          PATIENT TYPE:  EMS   LOCATION:  ED                           [facility]ACILITY[facility]  [facility]   PHYSICIAN:  Corinna L. Lendell Caprice, MDDATE OF BIRTH:  [birth year]   DATE OF ADMISSION:  05/11/2006  DATE OF DISCHARGE:                              HISTORY & PHYSICAL   CHIEF COMPLAINT:  Shortness of breath.   HISTORY OF PRESENT ILLNESS:  Ms. [name] is a 77 year old white  female, somewhat difficult historian, who presents to the emergency room  with rash and shortness of breath.  She is unable to provide specifics,  but she apparently went to [facility] earlier today, and  they got information that she was on clindamycin.  The patient cannot  recall the name of the medication.  Nevertheless, this was after having  some dental work done last week.  She started developing rash on her  neck which has since spread all over her body.  She also reports that  she is short of breath.  She is complaining of shoulder pain now.  She  has had possibly some sensation of her throat tightening as well.  She  has no ulcerations of her mucous membranes.  The rash is not pruritic or  painful.  She was on pain medication as well but cannot recall the name  of it.  In fact, she cannot recall most of the medications that she is  on.   PAST MEDICAL HISTORY:  1. Diabetes.  2. Hypertension.  3. Coronary artery disease.  Catheterization last year showed a normal      ejection fraction.  4. History of right carotid endarterectomy.   MEDICATIONS ON THE TRIAGE NOTES:  Altace, Amaryl, aspirin, Cozaar,  hydralazine, Lasix, Plavix, and Toprol XL, but it is uncertain how this  information was obtained.  The son is currently bringing in all her pill  bottles.   SOCIAL HISTORY:  The patient lives alone.  She is here with her son.  She does not drink and quit smoking in 1981.   FAMILY HISTORY:  Noncontributory.   REVIEW OF SYSTEMS:  As above, otherwise negative.   PHYSICAL EXAMINATION:  VITAL SIGNS:  Temperature is 98.5, blood pressure  145/53, pulse 87, respiratory rate 18, oxygen saturation 100% on room  air.  GENERAL:  The patient is a somewhat somnolent white female in no acute  distress.  HEENT:  She has maculopapular, erythematous rash over her hairline and a  few papules over her cheeks.  She has dry mucous membranes.  She does  have some tearing about the eyes.  There is no conjunctival injection or  edema.  Her tongue is not swollen, and she has no airway obstruction.  NECK:  Supple.  No lymphadenopathy.  LUNGS:  Clear to auscultation bilaterally without wheezes, rhonchi, or  rales.  CARDIOVASCULAR:  Regular rate and rhythm without murmurs, gallops or  rubs.  ABDOMEN:  Normal bowel sounds, soft, nontender, nondistended.  GU AND RECTAL:  Deferred.  EXTREMITIES:  No clubbing, cyanosis or edema.  SKIN:  She has a  maculopapular rash involving her face, neck, arms,  back, torso, legs.  They spare the palms and soles.  She has no  epidermal sloughing.  She has no involvement of her vaginal, perirectal,  or mouth.   LABORATORY DATA:  White blood cell count is 19,000, otherwise  unremarkable.  Basic metabolic panel significant for a potassium at 3.3,  glucose of 207, otherwise unremarkable.   ASSESSMENT/PLAN:  1. Drug exanthem reportedly due to clindamycin.  I have asked the      patient's son to bring all of her medications, and she has no      mucous membrane involvement to suggest Stevens-Johnson syndrome,      but she does complain of some dyspnea and throat tightening.  She      has no signs of anaphylaxis at this time, but I will place her on      23-hour observation and give steroids, H1 and H2 blockers, as well      as p.r.n. albuterol nebulizer.  2. Diabetes.  She will need sliding scale coverage while on steroids.      I am not sure what dose of Amaryl she takes, and hopefully  her son      can assist here.  3. Hypertension.  Again, await medication from family.  4. Coronary artery disease.  I will continue aspirin and Plavix and      await further medication information.      Corinna L. Lendell Caprice, MD  Electronically Signed     CLS/MEDQ  D:  05/11/2006  T:  05/12/2006  Job:  [id number]   cc:   Donia Guiles, M.D.  Fax: [phone]

## 2010-10-07 NOTE — Discharge Summary (Signed)
[name], [name]            ACCOUNT NO.:  [id number]   MEDICAL RECORD NO.:  [id number]          PATIENT TYPE:  INP   LOCATION:  3710                         FACILITY:  [facility]   PHYSICIAN:  Georga Hacking, M.D.DATE OF BIRTH:  May 21, 1927   DATE OF ADMISSION:  09/13/2006  DATE OF DISCHARGE:  09/17/2006                               DISCHARGE SUMMARY   FINAL DIAGNOSES:  1. Back pain of uncertain etiology, possible angina equivalent.  2. Coronary artery disease with previous internal mammary graft to the      left anterior descending which was patent at catheterization last      December; significant stenosis involving the right coronary artery      treated medically.  3. Hypertensive heart disease.  4. Peripheral vascular disease.  5. Diabetes mellitus.  6. Esophageal reflux.  7. History of diastolic congestive heart failure.  8. Hypertensive heart disease.  9. Possible mild dementia.   CONSULTATIONS:  [name], M.D.   HISTORY:  A 77 year old female was admitted as an emergency patient for  an unspecified complaints.  She initially called complaining that her  abdomen was swollen and that she was taking extra Lasix and then  complained of back pain into her left upper back area.  She stated this  was similar to her previous angina and eventually came to the emergency  room.  Initial point-of-care enzymes were negative in the emergency  room.  She continued to complain of back pain and was admitted to the  hospital for further treatment.  Please see previously dictated History  and Physical for the remainder of the details.   HOSPITAL COURSE:  Lab data shows a hemoglobin of 11.6, hematocrit 33.6.  Sodium is 137, potassium 3.5, glucose 115, BUN 23, creatinine 1.03.  Initial point-of-care enzymes were negative.  A subsequent enzyme the  next morning showed an MB of 6.5 and a troponin of 0.06.  TSH was 3.364.   The patient had an abnormal EKG with some ST and T wave  changes.  She  had some T wave inversions noted in V2 and became more inverted as the  EKGs went on.  She continued to have some back pain that was vague.  She  took Tylenol for it and also had some nitroglycerin used for.  She was  seen in consultation by Dr. Jeanie Sewer who noted marked improvement in  her mental status.  He felt that she could differentiate between options  and risks and benefits and can reason well and had the capacity for  informed consent.  The family is concerned about some of her memory  impairments, but she was thought the competent enough to handle her  affairs and wished to return home.  She was to stay out of rehab for a  week and then may resume rehab.  She was begun on therapy with trazodone  50 mg at bedtime.  She is discharged at this time in improved condition  on:  1. Plavix 75 mg daily.  2. Aspirin 81 mg daily.  3. Altace 10 mg daily.  4. Cozaar 50 mg daily.  5. Amaryl 2 mg daily.  6. Hydralazine 50 mg b.i.d.  7. Toprol XL 50 mg daily.  8. Neurontin 3 mg b.i.d.  9. Imdur 60 mg daily.  10.Vytorin 10/40 mg daily.  11.Lasix 40 mg in the morning.  12.Nitroglycerin as needed.  13.Trazodone 50 mg at bedtime.   She is to follow up with me in 1 week.  She is to have a home health  nurse to assess her home situation and may resume rehab in 1 week.  She  is also advised to have a followup appointment with the neurologist that  will be arranged by her medical doctor.      Georga Hacking, M.D.  Electronically Signed     WST/MEDQ  D:  09/17/2006  T:  09/17/2006  Job:  [id number]   cc:   Donia Guiles, M.D.

## 2010-10-07 NOTE — H&P (Signed)
[name], [name]            ACCOUNT NO.:  [id number]   MEDICAL RECORD NO.:  [id number]          PATIENT TYPE:  EMS   LOCATION:  MAJO                         FACILITY:  [facility]   PHYSICIAN:  Georga Hacking, M.D.DATE OF BIRTH:  02-Apr-1927   DATE OF ADMISSION:  09/13/2006  DATE OF DISCHARGE:                              HISTORY & PHYSICAL   HISTORY:  The patient is a 77 year old female who has a complex past  history.  She has a history of significant coronary artery disease with  previous bypass grafting in 1981, 1982, 10-04-85 and in 10-04-1997.  She had a  internal mammary bypass to the LAD at that time in Oct 04, 1997.  Her husband  had died last year and she had been having some grieving and problems  with that.  She was hospitalized from December 21st January 2nd  following an allergic reaction to Clindamycin.  This was complicated by  congestive heart failure which was diastolic and she had a  subendocardial infarction.  She was diuresed and her BNP fell.  She had  some evidence of confusion and some unusual mental status and affects.  She had a non-Q-wave infarct and a catheterization done by the brachial  approach showed a patent internal mammary artery.  There was diffuse in-  stent restenosis of the left main stent.  The second marginal was  occluded at the site of the previous angioplasty.  The first  marginal  was patent with a 30% stenosis.  The right coronary artery was 60%  midvessel stenosis and 99% distal stenosis.  It was difficult to do  intervention on.  She was able to go home and has been followed in the  office since then and has gradually improved.  She has been involved in  cardiac rehab and has more recently begun to complain of pain in her  left upper back with rehab.  She called this evening stating that her  abdomen was swollen and that she had been told to drink a lot of water  by rehab.  She then complained of back pain and was complaining of some  chest pain and  decided to come to the emergency room.  She is currently  complaining of pain in her left back and states this is similar to her  previous angina.  She also stated that she had trouble walking at home  and some gait problems at home.   Her past history is complex.  She has a history of bypass grafting four  times previously.  She has a history of hypertension, type 2 diabetes,  peripheral vascular disease, significant anxiety and depression, history  of Bell's palsy, hyperlipidemia and history of gastroesophageal reflux  disease.  During her last admission, she was evaluated by the  psychiatrist and thought possibly had some early dementia.   PREVIOUS SURGERIES:  1. Cholecystectomy.  2. Hysterectomy.  3. Appendectomy.  4. Right carotid endarterectomy.  5. Bypass grafting.  6. She has also had removal of a lymph fistula in her right leg.   CURRENT MEDICATIONS:  1. Plavix 75 mg daily.  2. Aspirin daily.  3. Altace 10 mg daily.  4. Amaryl 2 mg daily.  5. Cozaar 50 mg b.i.d.  6. Hydralazine 50 b.i.d.  7. Toprol XL 50 daily.  8. Neurontin 300 mg b.i.d.  9. Imdur 30 mg daily.  10.Vytorin 10/20 mg daily.   FAMILY HISTORY:  Recorded in all the previous records, reviewed and  unchanged.   SOCIAL HISTORY:  She is a nonsmoker.  Does not use alcohol to excess.  She is a widow.  Currently lives alone.  Has 2 children.  She is  retired.  She quit smoking in 1981 but is currently nonsmoker.   REVIEW OF SYSTEMS:  She has always been a nebulous historian.  She has  no cataracts or glaucoma.  She has difficulty swallowing.  She has had  some dyspepsia and indigestion in the past.  She has had some mild edema  involving her lower legs in the past.  She has no urinary symptoms.  She  has had a previous right carotid endarterectomy in the past also.  Other  than what is noted above, the remainder of the review of systems is  unremarkable.   EXAMINATION:  She is an elderly female who is  oriented but history is  somewhat vague.  Blood pressure is 150/60, pulse of 70.  Skin is warm.  ENT:  EOMI.  PERRLA.  C&S clear.  Fundi not examined.  Pharynx negative.  Previous right carotid endarterectomy scar noted.  LUNGS:  Clear bilaterally.  CARDIOVASCULAR:  Exam showed normal S1-S2.  There was no S3.  There is a  1-2/6 murmur.  ABDOMEN:  Soft and nontender.  It is not particularly distended.  There  are previous scars noted in her groin.  Distal pulses are 1+.  Trace  edema is noted.   EKG shows some minor nonspecific changes.  Labs is pending at the time  of dictation.   IMPRESSION:  1. Back discomfort, rule out myocardial infarction or ischemia.  2. Coronary artery disease with previous distal disease in the right      coronary artery, moderate disease in the proximal right coronary      artery been treated medically in the past.  3. Hypertension.  4. Hyperlipidemia, under treatment.  5. Probable early dementia.  6. Abdominal swelling of uncertain cause.   RECOMMENDATIONS:  Check serial enzymes and EKG.  Rule out MI.  Observe  symptoms.      Georga Hacking, M.D.  Electronically Signed     WST/MEDQ  D:  09/13/2006  T:  09/14/2006  Job:  [id number]   cc:   Donia Guiles, M.D.

## 2010-10-07 NOTE — H&P (Signed)
NAMEOZZIE, [name]                      ACCOUNT NO.:  [id number]   MEDICAL RECORD NO.:  [id number]                   PATIENT TYPE:  INP   LOCATION:  NA                                   FACILITY:  [facility]   PHYSICIAN:  Eber Hong, P.A.           DATE OF BIRTH:  25-Sep-1926   DATE OF ADMISSION:  01/08/2002  DATE OF DISCHARGE:                                HISTORY & PHYSICAL   REFERRING PHYSICIANS:  1. Dr. Georga Hacking.  2. Dr. Desma Maxim.   CHIEF COMPLAINT:  Right ICA stenosis with a history of amaurosis fugax.   BRIEF HISTORY:  The patient is a 77 year old white female with a history of  a 10- to 15-minute episode of amaurosis fugax approximately 2 weeks ago.  At  that time, she completely lost vision in her right eye.  She has had similar  episodes since which have been brief.  She has also complained of some  numbness and tingling in her left arm during these episodes.  Doppler  studies showed severe right ICA stenosis.  MRI was obtained which confirmed  the right ICA is smaller, along with abnormalities of the brain that were  consistent with left brain ischemia.  She was referred to CV[facility] and Dr. Balinda Quails and he recommended right carotid endarterectomy at this time.  She is admitted electively for right carotid endarterectomy today.  She also  complains of ongoing right claudication of her buttocks, thigh and calf with  less than 50 feet.   PAST MEDICAL HISTORY:  Past medical history includes significant coronary  artery disease with coronary artery bypass grafting in 1981, 1982, 1987 and  1999.  She has a history of hyperlipidemia, hypertension for four years,  history of a hiatal hernia and adult-onset diabetes mellitus for one and a  half years.   PAST SURGICAL HISTORY:  Surgeries are as noted above, coronary artery bypass  grafting on four occasions.  She has had a right femoral artery stenosis,  closure of a lymphocele, November 1999,  from her injury to her artery in  1999 during her last surgery.  She has ongoing right leg claudication with  less than 50 feet.  She had a PTCA in 1994 and she is also status post  hysterectomy.   MEDICATIONS:  1. Cozaar 50 mg b.i.d.  2. Toprol-XL 50 mg q.d.  3. Lipitor 20 mg q.d.  4. Premarin 0.625 mg q.d.  5. Prilosec 20 mg q.d.  6. Trazodone 200 mg h.s.  7. Aspirin 325 mg q.d. -- coated aspirin.  8. Norvasc 5 mg q.d.  9. Amaryl 1 mg q.d.  10.      Dyazide 37.5/25 mg one q.d.  11.      Lasix 20 mg q.d.   ALLERGIES:  None known.   REVIEW OF SYSTEMS:  She has had a head cold and is on an antibiotic but she  is not sure what it is; she says it is better.  CV:  As above.  CARDIAC:  She has no history of MI.  She has no problems with coronary artery disease  at this time, no anginal symptoms.  PULMONARY:  She denies any problems.  No  shortness of breath on exertion.  No PND.  No orthopnea.  GI:  Negative.  GU:  Negative.  EXTREMITIES:  Ongoing right leg claudication.   FAMILY HISTORY:  Her mother died at age 58 secondary to gallbladder disease.  Her father died at an early age with pneumonia.  She has one living sister  who has dementia at age 7 and is in a nursing facility.  She has four  brothers and four sisters, all are deceased, all with coronary artery  disease.   SOCIAL HISTORY:  She has been married for 52 years, she has 2 children in  good health and she worked as a Interior and spatial designer.  She never used alcohol.  She  smokes less than 1 pack a day for 10 years and quit in 1981.   PHYSICAL EXAMINATION:  GENERAL:  This is a well-nourished, well-developed,  thin white female in no acute distress.  VITAL SIGNS:  Blood pressure is 110/60 in the right arm and 110/56 in the  left arm.  Pulse was 60 and regular.  Respirations were 18 and unlabored.  HEENT:  Head:  Normocephalic.  Eyes:  PERRLA.  EOMs intact.  Fundi not  visualized.  Ears, nose, throat and mouth:  Grossly within  normal limits.  NECK:  There was no JVD, no bruits, no thyromegaly and no lymphadenopathy.  CHEST:  Clear to auscultation and percussion bilaterally.  HEART:  Normal S1 and S2.  No murmurs, rubs, or gallops.  The sternotomy  incision was well-healed.  ABDOMEN:  Soft, nontender.  Positive bowel sounds.  No palpable  organomegaly.  She does have a well-healed midline incision below the  umbilicus.  GU/RECTAL:  Not performed, not medically indicated.  EXTREMITIES:  No clubbing, cyanosis, or edema.  CARDIOVASCULAR:  Pulses are +2 in the carotid, radial and brachial.  The  femorals are +1 on the right, +2 on the left.  Popliteal is +1 bilaterally.  DP and PTs are +2 bilaterally.  NEUROLOGIC:  No focal deficits at this time and her gait is normal.   IMPRESSION:  1. Right internal carotid artery stenosis with amaurosis fugax and transient     ischemic attacks.  2. Coronary artery disease, status post coronary artery bypass graft in     1981, 1982, 1987 and 1999.  3. Hypertension.  4. Hyperlipidemia.  5. New adult-onset diabetes mellitus, non-insulin dependent.  6. Strong family history of coronary artery disease.  7. Right lower extremity claudication.   PLAN:  The patient is admitted for elective right carotid endarterectomy in  the a.m.                                               Eber Hong, P.A.    WDJ/MEDQ  D:  01/03/2002  T:  01/07/2002  Job:  [id number]

## 2010-10-07 NOTE — Consult Note (Signed)
NAMEMAREN, WIESEN NO.:  [id number]   MEDICAL RECORD NO.:  [id number]          PATIENT TYPE:  INP   LOCATION:  2038                         FACILITY:  [facility]   PHYSICIAN:  [name], M.D.  DATE OF BIRTH:  10-29-1926   DATE OF CONSULTATION:  05/14/2006  DATE OF DISCHARGE:  05/23/2006                                 CONSULTATION   REQUESTING PHYSICIAN:  Dr. Jasmine Pang.   REASON FOR CONSULTATION:  Depression.   HISTORY OF PRESENT ILLNESS:  Ms. Cozetta Seif is a 77 year old  female admitted to the [facility] on May 11, 2006, due to  shortness of breath.   Ms. Skates has been experiencing a depressed mood, low energy,  difficulty concentrating and anhedonia.  She also has been having  difficulty with memory and confusion.  Her primary care physician  started Remeron.  She continues with the same symptoms as above.   PAST PSYCHIATRIC HISTORY:  Ms. Narvaiz was noted to have depression in  the past.  She was on Remeron in March 2006 as well.  She also was on  trazodone 25-50 mg in the evening for sleep.  She has been taking  trazodone as far back as 2001.  It was increased to 200 mg q.h.s., and,  as mentioned above, she was then tried on Remeron.   FAMILY PSYCHIATRIC HISTORY:  None known.   SOCIAL HISTORY:  Marital status:  Widowed.  Children:  2.  Occupation:  Retired.  No alcohol abuse.  No illegal drug use.  She has been residing  by herself.   She quit smoking in 1981.   GENERAL MEDICAL PROBLEMS:  1. Diabetes.  2. Hypertension.  3. History of right carotid endarterectomy.  4. Coronary artery disease.   MEDICATIONS:  The MAR is reviewed.  The patient is on Remeron 15 mg  q.h.s.   ALLERGIES:  SHE HAS ALLERGIES TO CLINDAMYCIN.   LABORATORY DATA:  WBC 19.   REVIEW OF SYSTEMS:  CONSTITUTIONAL:  Afebrile.  HEAD:  No trauma.  EYES:  No visual changes.  EARS:  No hearing impairment.  NOSE:  No rhinorrhea.  MOUTH/THROAT:   No sore throat.  NEUROLOGIC:  Unremarkable.  PSYCHIATRIC:  As above.  CARDIOVASCULAR:  No chest pain.  RESPIRATORY:  No cough.  GASTROINTESTINAL:  No vomiting or diarrhea.  GENITOURINARY:  No dysuria.  SKIN:  Unremarkable.  MUSCULOSKELETAL:  No deformities.  ENDOCRINE/METABOLIC:  The patient's glucose has been mildly to  moderately elevated.  HEMATOLOGIC/LYMPHATIC:  Unremarkable.   PHYSICAL EXAMINATION:  VITAL SIGNS:  Afebrile.  Vital signs are stable.  MENTAL STATUS:  Ms. Stahle is an elderly female appearing her  chronologic age, lying in a supine position in her hospital bed with  good eye contact.  She is well groomed and socially appropriate.  On  orientation questioning, she does know the month and the year; however,  she does not know the day of the month or the day of the week.  She  believes that she is at home and actually states, I'm at home.  She is  able to repeat 3/3  words immediately but 0/3 on recall.  Mood:  Depressed.  Affect:  Constricted.  Judgment:  Impaired.  Insight:  Poor.  Thought process:  Coherent.  Thought content:  No thoughts of harming  herself.  No thoughts of harming others.  No delusions.  No  hallucinations.  Fund of knowledge and intelligence:  Below that of her  premorbid baseline.  Speech:  Involves normal rate and prosody.   ASSESSMENT:  AXIS I:  1.  293.83, mood disorder not otherwise specified  (functional and general medical factors), depressed.  2.  296.33, major  depressive disorder, recurrent, severe.  3.  294.9, unspecified  persistent mental disorder, not otherwise specified.  The patient  appears to either have a partial delirium or a dementia.  AXIS II:  Deferred.  AXIS III:  See general medical problems.  AXIS IV:  General medical primary support group.  AXIS V:  40.   RECOMMENDATIONS:  1. Would ensure that a memory dysfunction reversible etiology workup      has been conducted.  2. Would discontinue the Remeron and would utilize  trazodone 25 mg      q.h.s. p.r.n. insomnia.  May repeat in 1 hour if no response.  3. Would start Celexa 10 mg q.a.m. for antidepression and increase as      tolerated within 3 days to 20 mg q.a.m.  4. If the memory dysfunction workup is negative and the patient does      not recover her cognitive function and memory function, would      consider Aricept and/or Namenda.  5. Would avoid anticholinergic medication.      [name], M.D.  Electronically Signed     JW/MEDQ  D:  09/15/2006  T:  09/15/2006  Job:  [id number]

## 2010-10-07 NOTE — Op Note (Signed)
[facility]  Patient:    [name]                    MRN: [id number] Proc. Date: 12/21/99 Adm. Date:  [id number] Attending:  Chevis Pretty S                           Operative Report  PREOPERATIVE DIAGNOSIS:  Symptomatic cholelithiasis.  POSTOPERATIVE DIAGNOSIS:  Symptomatic cholelithiasis.  OPERATION PERFORMED:  Laparoscopic cholecystectomy.  SURGEON:  Chevis Pretty, M.D.  ASSISTANT:  Angelia Mould. Derrell Lolling, M.D.  ANESTHESIA:  General endotracheal.  DESCRIPTION OF PROCEDURE:  After informed consent was obtained, the patient was brought to the operating room and placed in supine position on the operating room table.  After adequate induction of general anesthesia, the patients abdomen was prepped with Betadine and draped in the usual sterile manner.  Given her previous lower midline incision, a small transverse superumbilical incision was made.  This incision was bluntly carried down to the linea alba.  The linea alba was incised vertically with a 15 blade knife and each side was grasped with a Kocher clamp.  The peritoneum was identified and opened bluntly with a Kelly clamp.  A finger was inserted into this opening to ensure that we were intraperitoneal and that the abdominal wall was free of adhesions, which it was.  The 0 Vicryl pursestring stitch was placed around this opening in the abdominal cavity through the fascial layers.  Next, a Hasson cannula was placed through this opening into the abdomen.  The blunt trocar was removed and the abdomen was insufflated with carbon dioxide.  The camera was inserted through the Hasson port and the gallbladder was able to be readily visualized.  A small transverse upper midline incision was made and a blunt 10 mm port was placed through this incision into the abdominal cavity under direct vision.  Two smaller lateral incisions were made below the costal margin on the right side of the abdomen and two  5 mm ports were placed through these incisions into the abdominal cavity again under direct vision.  Using the two lateral ports for retraction, the dome of the gallbladder was grasped and elevated anteriorly and superiorly up over the edge of the liver.  A blunt grasper was placed through the other lateral port and was used to grasp down near the neck of the gallbladder.  A [place] dissector was placed through the upper midline port and the [place] was used to both bluntly and using the electrocautery, remove the peritoneal reflection and omental adhesions from the base of the gallbladder.  Once this was done, the cystic duct gallbladder junction was easily identified.  This area of the cystic duct and gallbladder neck were dissected circumferentially.  Visualizing the cystic duct gallbladder junction, the common duct was kept medial to this.  Two clips were placed proximally on the cystic duct and one distally on the gallbladder neck cystic duct junction and the cystic duct was ligated between these.  The cystic artery was readily identified posterior to this and was also dissected bluntly in a circumferential manner.  Two clips were placed on this proximally and one distally and the artery was ligated between these.  Once this was complete, the gallbladder was dissected away from the liver bed using both blunt dissection with a spatula as well Bovie electrocautery.  Before the gallbladder was completely removed from the liver bed,  the liver bed was re-examined and any small bleeding points were stopped using the Bovie electrocautery.  At this point hemostasis was excellent.  The rest of the gallbladder was removed from the liver bed.  The camera was moved to the upper midline port and an Endobag was placed through the Hasson cannula.  The gallbladder was placed in the Endobag under direct vision and the bag was removed through the Hasson port.  When this was complete, the  Hasson cannula was placed back into the abdomen.  The liver bed was again examined and hemostasis was excellent.  The abdomen was irrigated with copious amounts of saline until the effluent was clear.  Each of the ports was then removed under direct visualization and hemostasis was again excellent.  The pursestring suture was used to close the umbilical port.  The skin was closed with interrupted subcuticular stitches using a 4-0 Monocryl.  Steri-Strips were applied to each of these incisions.  At the end of the case, all needle sponge and instrument counts were correct.  The patient was awakened and taken to the recovery room in stable condition. DD:  12/21/99 TD:  12/23/99 Job: [id number]

## 2010-10-07 NOTE — Op Note (Signed)
NAMEZAMIAH, [name]            ACCOUNT NO.:  [id number]   MEDICAL RECORD NO.:  [id number]          PATIENT TYPE:  OIB   LOCATION:  5710                         FACILITY:  [facility]   PHYSICIAN:  Charles E. Fields, MD  DATE OF BIRTH:  [birth year]   DATE OF PROCEDURE:  07/21/2004  DATE OF DISCHARGE:                                 OPERATIVE REPORT   PROCEDURE:  1.  Evacuation of hematoma, left groin.  2.  Repair of left external iliac artery pseudoaneurysm.   PREOPERATIVE DIAGNOSIS:  Pseudoaneurysm and hematoma left groin.   POSTOPERATIVE DIAGNOSIS:  Pseudoaneurysm and hematoma left groin.   ANESTHESIA:  General.   INDICATIONS:  The patient is a 77 year old female who is status post cardiac  catheterization earlier today. She began to experience pain in the left  groin post procedure. CT scan of the abdomen and pelvis showed a large  hematoma in the left abdominal wall with its origin near the left groin at  the site of the stick.   The patient began to have increasing pain, expansion of the hematoma, and  hemodynamic instability. She was taken to the operating room emergently for  evacuation of the hematoma and repair pseudoaneurysm.   ASSISTANT:  Danelle Earthly, PA.   OPERATIVE FINDINGS:  1.  Three small needle holes in the anteromedial aspect of the left external      iliac artery approximately 2 cm above the left inguinal ligament,      repaired primarily.  2.  Large hematoma, subfascial, along the left abdominal wall.   OPERATIVE DETAILS:  After obtaining informed consent, the patient was taken  to the operating room. The patient was placed in the supine position on the  operating table. After induction of general anesthesia, the patient's  abdomen and bilateral lower extremities were prepped and draped in the usual  sterile fashion. Next, a longitudinal incision was made over the left common  femoral artery over the skin needle insertion site. Incision was carried  down through the subcutaneous tissues, down to the level the common femoral  artery. The patient had previously had cannulation through the left femoral  artery for coronary bypass grafting. There were some adhesions around the  artery, and these were taken down with sharp dissection. The suture line  from the previous cannulation could be seen at the inferior aspect of the  wound. The bleeding was encountered at the upper-most portion of the  incision. To reach the area of bleeding, approximately 3 cm of the left  inguinal ligament had to be split with cautery and sharp dissection. At this  point, the needle hole could be visualized actively bleeding. There were  three separate holes, and these were each repaired with interrupted 5-0  Prolene sutures. Hemostasis was obtained at this point. The external iliac  artery was explored up to the level of the inferior epigastric artery.  There were no further bleeding sites. Next, attention was turned toward  evacuation of the hematoma. There was a large amount of hematoma at least 15  cm in length up across the left anterior abdominal wall which was  subfascial. This was evacuated as much as possible using suction and manual  evacuation. This was irrigated with normal saline solution. After hemostasis  had been obtained and the hematoma evacuated, a #10 flat Jackson-Pratt drain  was brought out through a separate stab incision on the lateral aspect of  the wound lateral to the wound. A Jackson-Pratt drain was placed with its  tip up in the lateral abdominal wall. Next, the wound was thoroughly  irrigated with normal saline solution. The deep layers of the groin were  closed with a running 2-0 Vicryl suture. Superficial layer was closed with a  running 3-0 Vicryl suture. The skin was closed with 4-0 Vicryl subcuticular  stitch. The drain was stitched into place with a Prolene stitch.  The  patient tolerated the procedure well and there were no  complications.  Instrument, sponge, and needle count was correct at the end of the case. The  patient taken to the recovery in stable condition.      CEF/MEDQ  D:  07/21/2004  T:  07/22/2004  Job:  [id number]   cc:   Darden Palmer., M.D.  [address], [address]  [place]  [place] [address]  Fax: [phone]

## 2010-10-07 NOTE — Consult Note (Signed)
NAMEDOMINI, VANDEHEI            ACCOUNT NO.:  [id number]   MEDICAL RECORD NO.:  [id number]          PATIENT TYPE:  INP   LOCATION:  2038                         FACILITY:  [facility]   PHYSICIAN:  [name], M.D.  DATE OF BIRTH:  09-05-1926   DATE OF CONSULTATION:  05/19/2006  DATE OF DISCHARGE:  05/23/2006                                 CONSULTATION   Ms. Bronkema is no longer depressed.  She has hope, and she is able to  enjoy a visits.  Her nausea has improved.  She continues with memory  impairment.   MENTAL STATUS EXAM:  The patient continues to not remember the  undersigned.  She has a pleasant social smile.  Her affect is mildly  flat at baseline but broad and appropriate as the conversation  continues.  She has no thoughts of harming herself or others.  Her  judgment is still impaired.   Her B12 returned at 390.  Her folic acid returned at 845 which is within  normal limits   ASSESSMENT:  Unspecified persistent mental disorder, 294.9, not  otherwise specified.  This may represent a primary dementia.   The patient will require followup.   RECOMMENDATIONS:  1. Would consider Aricept and/or Namenda.  2. Would have this patient follow up with one of the outpatient      psychiatric clinics at [facility], [facility], or [facility] for further evaluation and      treatment of her cognitive and memory dysfunction  and on her mood symptoms.      [name], M.D.  Electronically Signed     JW/MEDQ  D:  09/15/2006  T:  09/15/2006  Job:  [id number]

## 2010-10-07 NOTE — Discharge Summary (Signed)
[name], [name]                      ACCOUNT NO.:  [id number]   MEDICAL RECORD NO.:  [id number]                   PATIENT TYPE:  INP   LOCATION:  2038                                 FACILITY:  [facility]   PHYSICIAN:  Gina L. Thomasena Edis, P.A.               DATE OF BIRTH:  1926-06-20   DATE OF ADMISSION:  01/08/2002  DATE OF DISCHARGE:                                 DISCHARGE SUMMARY   PRIMARY ADMISSION DIAGNOSIS:  Symptomatic right internal carotid artery  stenosis.   ADDITIONAL DIAGNOSES:  1. Right subcortical infarction, postoperatively.  2. History of hypertension.  3. History of coronary artery disease, status post coronary artery bypass     graft.  4. History of hyperlipidemia.  5. History of hiatal hernia.  6. Type 2 noninsulin-dependent diabetes mellitus.  7. Peripheral vascular disease with ongoing right leg claudication.   PROCEDURE:  Right carotid endarterectomy with Dacron patch angioplasty.   HISTORY OF PRESENT ILLNESS:  The patient is a 77 year old white female who,  approximately two weeks prior to this admission, developed an episode of  amaurosis fugax which lasted approximately 10 to 15 minutes.  She reports  similar episodes which have occurred prior to this but were very brief and  she did not seek attention at that time.  She also had some numbness and  tingling in her left arm during this most  recent episode.  Doppler study  was performed which showed severe right internal carotid artery stenosis.  This is confirmed by MRI.  She was referred to Dr. Balinda Quails for  evaluation.  It was recommended that she proceed with surgical intervention  at this time.   HOSPITAL COURSE:  She is taken to the operating room on August 20,m 2003 and  underwent a right carotid endarterectomy with Dacron patch angioplasty.  She  tolerated procedure well and was transferred to the floor in stable  condition.  Following her transfer to the floor, she developed  hypotension  with blood pressure in the 80s over 30 range.  She was transferred to the  SICU and monitored closely.  She was treated with volume repletion and her  blood pressure did return to baseline.  She remained in the SICU for 24 hour  observation and was transferred back out to the floor.  Late in the day on  postoperative day one, she developed weakness of her left lower extremity.  She had normal range of motion but decreased sensation as well as  significantly decreased muscle strength.  Right carotid duplex was performed  which showed no evidence of a stenosis.  She was seen in consultation by  neurology and CT scan of the head was performed.  She later underwent a MRI  of the brain as well and this showed a right brain subcortical infarct.  She  was started on Plavix in addition to aspirin.   Postoperatively, she has been working with  physical therapy and occupational  therapy and is making some improvement.  She was initially evaluated for  inpatient rehabilitation stay but at this point she is making very good  improvement and it felt that she could be discharged to home with home  health physical therapy.  Otherwise, she has remained neurologically intact  throughout her admission.  Her incisions are healing well.  Her vital signs  have remained stable.  She has been afebrile.   At this time, she is ready for discharge.   DISCHARGE MEDICATIONS:  1. Cozaar 15 mg b.i.d.  2. Toprol XL 50 mg q.d.  3. Lipitor 20 mg q.d.  4. Premarin 0.625 mg q.d.  5. Prilosec 20 mg q.d.  6. Trazodone 200 mg q.h.s.  7. Enteric-coated aspirin 325 mg q.d.  8. Norvasc 5 mg q.d.  9. Amaryl 1 mg q.d.  10.      Maxzide 37.5/25 mg q.d.  11.      Lasix 40 mg q.d.  12.      Plavix 75 mg q.d.  13.      Ultram 50 to 100 mg q.4h. p.r.n. pain.   DISCHARGE INSTRUCTIONS:  She is to refrain from driving, heavy lifting, or  strenuous activity.  She may continue her same preoperative diet.  She may   shower daily and clean her incisions with soap and water.   FOLLOW UP:  She will see Dr. Balinda Quails in the office on Monday,  February 03, 2002 at 11:20 a.m.  Home health nursing will be arranged in  addition to physical and occupational therapy to continue outpatient  rehabilitation.                                                  Gina L. Seward Meth.    GLC/MEDQ  D:  01/14/2002  T:  01/16/2002  Job:  [id number]   cc:   Desma Maxim, M.D.   Deanna Artis. Sharene Skeans, M.D.  [address]  [place]  [place] [address]  Fax: [phone]

## 2011-02-10 LAB — POCT CARDIAC MARKERS
CKMB, poc: 1.4
CKMB, poc: 2.1
Myoglobin, poc: 112
Myoglobin, poc: 59.9
Operator id: 288831
Operator id: 294501
Troponin i, poc: 0.05
Troponin i, poc: <0.05

## 2011-02-10 LAB — I-STAT 8, (EC8 V) (CONVERTED LAB)
BUN: 19
Bicarbonate: 27.7 — ABNORMAL HIGH
Chloride: 104
Glucose, Bld: 86
Operator id: 288831
pCO2, Ven: 43.4 — ABNORMAL LOW
pH, Ven: 7.413 — ABNORMAL HIGH

## 2011-02-10 LAB — CBC
MCV: 90
RBC: 3.93
WBC: 7.4

## 2011-02-10 LAB — DIFFERENTIAL
Basophils Absolute: 0
Lymphocytes Relative: 16
Monocytes Absolute: 0.4
Neutro Abs: 5.6
Neutrophils Relative %: 76

## 2011-02-15 LAB — POCT I-STAT, CHEM 8
Chloride: 100
Creatinine, Ser: 1.1
Glucose, Bld: 126 — ABNORMAL HIGH
HCT: 39
Hemoglobin: 13.3
Potassium: 3.8
Sodium: 136

## 2011-02-15 LAB — DIFFERENTIAL
Eosinophils Absolute: 0.2
Lymphs Abs: 1.4
Monocytes Relative: 6
Neutro Abs: 5.9
Neutrophils Relative %: 73

## 2011-02-15 LAB — CBC
Platelets: 251
RBC: 4.19
WBC: 8

## 2011-02-22 LAB — GLUCOSE, CAPILLARY: Glucose-Capillary: 118 — ABNORMAL HIGH

## 2011-03-03 LAB — CK TOTAL AND CKMB (NOT AT ARMC)
CK, MB: 5.1 — ABNORMAL HIGH
Relative Index: 4.5 — ABNORMAL HIGH
Total CK: 113

## 2011-03-03 LAB — POCT CARDIAC MARKERS
Myoglobin, poc: 105
Operator id: 192351
Troponin i, poc: <0.05

## 2011-03-03 LAB — I-STAT 8, (EC8 V) (CONVERTED LAB)
BUN: 24 — ABNORMAL HIGH
Glucose, Bld: 106 — ABNORMAL HIGH
Hemoglobin: 12.9
Potassium: 4.1
Sodium: 128 — ABNORMAL LOW
pH, Ven: 7.495 — ABNORMAL HIGH

## 2011-03-03 LAB — COMPREHENSIVE METABOLIC PANEL
Albumin: 3.9
Alkaline Phosphatase: 69
BUN: 20
CO2: 25
Chloride: 104
GFR calc non Af Amer: 50 — ABNORMAL LOW
Glucose, Bld: 85
Potassium: 4.1
Total Bilirubin: 0.6

## 2011-03-03 LAB — BASIC METABOLIC PANEL
BUN: 16
Calcium: 9.1
Calcium: 9.2
Creatinine, Ser: 0.93
GFR calc non Af Amer: 58 — ABNORMAL LOW
GFR calc non Af Amer: >60
Glucose, Bld: 107 — ABNORMAL HIGH
Glucose, Bld: 90
Sodium: 134 — ABNORMAL LOW

## 2011-03-03 LAB — TROPONIN I
Troponin I: 0.11 — ABNORMAL HIGH
Troponin I: 0.11 — ABNORMAL HIGH

## 2011-03-03 LAB — CBC
Hemoglobin: 12.9
Platelets: 269
RDW: 13.8
WBC: 6.3

## 2011-03-03 LAB — POCT I-STAT CREATININE: Operator id: 192351

## 2011-05-29 DIAGNOSIS — E119 Type 2 diabetes mellitus without complications: Secondary | ICD-10-CM | POA: Diagnosis not present

## 2011-05-29 DIAGNOSIS — R635 Abnormal weight gain: Secondary | ICD-10-CM | POA: Diagnosis not present

## 2011-05-29 DIAGNOSIS — E782 Mixed hyperlipidemia: Secondary | ICD-10-CM | POA: Diagnosis not present

## 2011-05-29 DIAGNOSIS — R05 Cough: Secondary | ICD-10-CM | POA: Diagnosis not present

## 2011-05-31 DIAGNOSIS — E782 Mixed hyperlipidemia: Secondary | ICD-10-CM | POA: Diagnosis not present

## 2011-05-31 DIAGNOSIS — I10 Essential (primary) hypertension: Secondary | ICD-10-CM | POA: Diagnosis not present

## 2011-05-31 DIAGNOSIS — R413 Other amnesia: Secondary | ICD-10-CM | POA: Diagnosis not present

## 2011-05-31 DIAGNOSIS — E1129 Type 2 diabetes mellitus with other diabetic kidney complication: Secondary | ICD-10-CM | POA: Diagnosis not present

## 2011-06-05 ENCOUNTER — Emergency Department (HOSPITAL_COMMUNITY)
Admission: EM | Admit: 2011-06-05 | Discharge: 2011-06-05 | Disposition: A | Discharge status: Home / Self Care | Payer: Medicare Other | Attending: Emergency Medicine | Admitting: Emergency Medicine

## 2011-06-05 ENCOUNTER — Encounter (HOSPITAL_COMMUNITY): Payer: Self-pay | Admitting: *Deleted

## 2011-06-05 ENCOUNTER — Emergency Department (HOSPITAL_COMMUNITY): Payer: Medicare Other

## 2011-06-05 ENCOUNTER — Other Ambulatory Visit: Payer: Self-pay

## 2011-06-05 DIAGNOSIS — R109 Unspecified abdominal pain: Secondary | ICD-10-CM | POA: Diagnosis not present

## 2011-06-05 DIAGNOSIS — R05 Cough: Secondary | ICD-10-CM | POA: Diagnosis not present

## 2011-06-05 DIAGNOSIS — R110 Nausea: Secondary | ICD-10-CM | POA: Insufficient documentation

## 2011-06-05 DIAGNOSIS — R0602 Shortness of breath: Secondary | ICD-10-CM | POA: Diagnosis not present

## 2011-06-05 DIAGNOSIS — R141 Gas pain: Secondary | ICD-10-CM | POA: Insufficient documentation

## 2011-06-05 DIAGNOSIS — K439 Ventral hernia without obstruction or gangrene: Secondary | ICD-10-CM | POA: Insufficient documentation

## 2011-06-05 DIAGNOSIS — R142 Eructation: Secondary | ICD-10-CM | POA: Insufficient documentation

## 2011-06-05 LAB — CBC
Hemoglobin: 12.9 g/dL (ref 12.0–15.0)
MCH: 29.2 pg (ref 26.0–34.0)
RBC: 4.42 MIL/uL (ref 3.87–5.11)

## 2011-06-05 LAB — URINE MICROSCOPIC-ADD ON

## 2011-06-05 LAB — URINALYSIS, ROUTINE W REFLEX MICROSCOPIC
Bilirubin Urine: NEGATIVE
Glucose, UA: NEGATIVE mg/dL
Hgb urine dipstick: NEGATIVE
Ketones, ur: NEGATIVE mg/dL
Specific Gravity, Urine: 1.004 — ABNORMAL LOW (ref 1.005–1.030)
pH: 6.5 (ref 5.0–8.0)

## 2011-06-05 LAB — COMPREHENSIVE METABOLIC PANEL
Alkaline Phosphatase: 81 U/L (ref 39–117)
BUN: 27 mg/dL — ABNORMAL HIGH (ref 6–23)
Chloride: 99 mEq/L (ref 96–112)
GFR calc Af Amer: 43 mL/min — ABNORMAL LOW (ref >90)
Glucose, Bld: 93 mg/dL (ref 70–99)
Potassium: 4.1 mEq/L (ref 3.5–5.1)
Total Bilirubin: 0.5 mg/dL (ref 0.3–1.2)

## 2011-06-05 LAB — DIFFERENTIAL
Lymphs Abs: 1.5 10*3/uL (ref 0.7–4.0)
Monocytes Relative: 11 % (ref 3–12)
Neutro Abs: 4.9 10*3/uL (ref 1.7–7.7)
Neutrophils Relative %: 65 % (ref 43–77)

## 2011-06-05 NOTE — ED Provider Notes (Signed)
History     CSN: [id number]  Arrival date & time 06/05/11  1455   First MD Initiated Contact with Patient 06/05/11 1506      No chief complaint on file.   (Consider location/radiation/quality/duration/timing/severity/associated sxs/prior treatment) Patient is a 78 y.o. female presenting with general illness. The history is provided by the patient. No language interpreter was used.  Illness  The current episode started 5 to 7 days ago. The onset was sudden. The problem occurs continuously. The problem has been unchanged. The problem is mild. The symptoms are relieved by nothing. The symptoms are aggravated by nothing. Associated symptoms include nausea (occasional, none now). Pertinent negatives include no fever, no double vision, no abdominal pain, no diarrhea, no vomiting and no cough.    No past medical history on file.  No past surgical history on file.  No family history on file.  History  Substance Use Topics  . Smoking status: Not on file  . Smokeless tobacco: Not on file  . Alcohol Use: Not on file    OB History    No data available      Review of Systems  Constitutional: Negative for fever and chills.  Eyes: Negative for double vision.  Respiratory: Positive for shortness of breath (mild, occasional). Negative for cough and choking.   Cardiovascular: Negative for chest pain and leg swelling.  Gastrointestinal: Positive for nausea (occasional, none now) and abdominal distention. Negative for vomiting, abdominal pain and diarrhea.  All other systems reviewed and are negative.    Allergies  Clindamycin/lincomycin  Home Medications  No current outpatient prescriptions on file.  BP 154/49  Pulse 62  Temp(Src) 99.1 F (37.3 C) (Oral)  Resp 24  SpO2 98%  Physical Exam  Nursing note and vitals reviewed. Constitutional: She is oriented to person, place, and time. She appears well-developed and well-nourished. No distress.  HENT:  Head: Normocephalic and  atraumatic.  Eyes: EOM are normal. Pupils are equal, round, and reactive to light.  Neck: Normal range of motion. Neck supple.  Cardiovascular: Normal rate and regular rhythm.  Exam reveals no friction rub.   No murmur heard. Pulmonary/Chest: Effort normal and breath sounds normal. No respiratory distress. She has no wheezes. She has no rales.  Abdominal: Soft. She exhibits no distension. There is no tenderness. There is no rebound.  Musculoskeletal: Normal range of motion. She exhibits no edema.  Neurological: She is alert and oriented to person, place, and time.  Skin: She is not diaphoretic.    ED Course  Procedures (including critical care time)  Labs Reviewed  COMPREHENSIVE METABOLIC PANEL - Abnormal; Notable for the following:    BUN 27 (*)    Creatinine, Ser 1.30 (*)    GFR calc non Af Amer 37 (*)    GFR calc Af Amer 43 (*)    All other components within normal limits  URINALYSIS, ROUTINE W REFLEX MICROSCOPIC - Abnormal; Notable for the following:    Specific Gravity, Urine 1.004 (*)    Leukocytes, UA TRACE (*)    All other components within normal limits  CBC  DIFFERENTIAL  TROPONIN I  URINE MICROSCOPIC-ADD ON   Dg Chest 2 View  06/05/2011  *RADIOLOGY REPORT*  Clinical Data: Cough.  CHEST - 2 VIEW  Comparison: 05/29/2011  Findings: Heart size and vascularity are normal and the lungs are clear.  Evidence of prior CABG.  Calcified breast implants.  No acute osseous abnormality.  IMPRESSION: No acute disease.  Original Report Authenticated By:  Gwynn Burly, M.D.     1. Ventral hernia       MDM  Patient is an 78 year old female presents with generalized abdominal swelling for the past one week. She states she saw her primary physician earlier in the week he told her she had Alzheimer's and she had no problems. Patient reports no previous history of abdominal swelling. Occasional cough. Denies chest pain, fever, and vomiting, diarrhea, abdominal pain,  dysuria. Patient has history of multiple CABG, bilateral femoral popliteal bypass. Denies liver problems. The patient is afebrile vital signs are stable. Normal sinus rhythm on the monitor in her room. Normal pulse ox was good waveform on room air. Patient is no evidence abdominal swelling. No ascites. Patient is no abdominal pain to palpation. Lungs are clear and heart sounds are normal. Patient has good peripheral pulses. We'll check basic labs since the with patient's primary care doctor concerning her concerns for abdominal swelling, which is not evident on exam. Patient does have ventral wall hernia vs weakness. No clinical concern for incarcerated/strangulated hernia. No clinical concern for ACS.  CXR without acute findings. Labs unremarkable. Patient has trace leukocytes in her urine, with rare bacteria and rare squamous cells, likely contaminants. Patient stable for discharge, instructed to f/u with PCP in 1 week.     Elwin Mocha, MD 06/05/11 [phone]

## 2011-06-05 NOTE — ED Notes (Signed)
PT called EMS to day because of a one week HX of  ABD pain with ABD swelling. Pt now reports swelling in chest makes her feel SHOB. Pt comes from home alone.

## 2011-06-06 NOTE — ED Provider Notes (Signed)
This 78 year old female complains of intermittent abdominal bloating with certain positions such as moving from a lying to sitting position or if she stands up for a while. She states her midline abdominal region both upper and lower abdomen protrude when she tries to flex her abdominal musculature, and she has no vomiting no diarrhea no chest pain no shortness of breath no lateralizing abdominal pain and no constant pain actually no pain at all just an abdominal protrusion with examination reproducing the abdominal bulge consistent with nontender easily reducible and reproducible ventral hernia versus weakened abdominal musculature with no suspicion of incarcerated or strangulated hernia peritonitis or sepsis.  ECG: Normal sinus rhythm, ventricular rate 60, normal axis, diffuse nonspecific ST and T-wave changes with precordial inverted T waves lateral inverted T waves with inverted T waves new in V2 V3 and V4 compared with January 2012 but no current clinical suspicion of acute coronary syndrome  Hurman Horn, MD 06/09/11 1228

## 2011-06-09 NOTE — ED Provider Notes (Signed)
I saw and evaluated the patient, reviewed the resident's note and I agree with the findings and plan.  Hurman Horn, MD 06/09/11 [phone]

## 2011-06-23 ENCOUNTER — Other Ambulatory Visit: Payer: Self-pay | Admitting: Cardiology

## 2011-07-17 ENCOUNTER — Other Ambulatory Visit: Payer: Self-pay | Admitting: Cardiology

## 2011-09-12 DIAGNOSIS — I2510 Atherosclerotic heart disease of native coronary artery without angina pectoris: Secondary | ICD-10-CM | POA: Diagnosis not present

## 2011-09-12 DIAGNOSIS — I119 Hypertensive heart disease without heart failure: Secondary | ICD-10-CM | POA: Diagnosis not present

## 2011-09-12 DIAGNOSIS — E1159 Type 2 diabetes mellitus with other circulatory complications: Secondary | ICD-10-CM | POA: Diagnosis not present

## 2011-09-12 DIAGNOSIS — R0789 Other chest pain: Secondary | ICD-10-CM | POA: Diagnosis not present

## 2011-09-12 DIAGNOSIS — I5032 Chronic diastolic (congestive) heart failure: Secondary | ICD-10-CM | POA: Diagnosis not present

## 2011-09-12 DIAGNOSIS — I359 Nonrheumatic aortic valve disorder, unspecified: Secondary | ICD-10-CM | POA: Diagnosis not present

## 2011-09-12 DIAGNOSIS — I739 Peripheral vascular disease, unspecified: Secondary | ICD-10-CM | POA: Diagnosis not present

## 2011-09-12 DIAGNOSIS — E785 Hyperlipidemia, unspecified: Secondary | ICD-10-CM | POA: Diagnosis not present

## 2011-10-02 ENCOUNTER — Other Ambulatory Visit: Payer: Self-pay | Admitting: Cardiology

## 2011-11-28 DIAGNOSIS — R413 Other amnesia: Secondary | ICD-10-CM | POA: Diagnosis not present

## 2011-11-28 DIAGNOSIS — E1129 Type 2 diabetes mellitus with other diabetic kidney complication: Secondary | ICD-10-CM | POA: Diagnosis not present

## 2011-11-28 DIAGNOSIS — E782 Mixed hyperlipidemia: Secondary | ICD-10-CM | POA: Diagnosis not present

## 2011-11-28 DIAGNOSIS — I10 Essential (primary) hypertension: Secondary | ICD-10-CM | POA: Diagnosis not present

## 2011-12-14 DIAGNOSIS — N179 Acute kidney failure, unspecified: Secondary | ICD-10-CM | POA: Diagnosis not present

## 2012-01-08 DIAGNOSIS — R413 Other amnesia: Secondary | ICD-10-CM | POA: Diagnosis not present

## 2012-01-08 DIAGNOSIS — M79609 Pain in unspecified limb: Secondary | ICD-10-CM | POA: Diagnosis not present

## 2012-02-22 DIAGNOSIS — Z23 Encounter for immunization: Secondary | ICD-10-CM | POA: Diagnosis not present

## 2012-03-06 ENCOUNTER — Encounter: Payer: Self-pay | Admitting: Vascular Surgery

## 2012-03-08 DIAGNOSIS — I2510 Atherosclerotic heart disease of native coronary artery without angina pectoris: Secondary | ICD-10-CM | POA: Diagnosis not present

## 2012-03-08 DIAGNOSIS — I359 Nonrheumatic aortic valve disorder, unspecified: Secondary | ICD-10-CM | POA: Diagnosis not present

## 2012-03-08 DIAGNOSIS — I739 Peripheral vascular disease, unspecified: Secondary | ICD-10-CM | POA: Diagnosis not present

## 2012-03-08 DIAGNOSIS — I5032 Chronic diastolic (congestive) heart failure: Secondary | ICD-10-CM | POA: Diagnosis not present

## 2012-03-08 DIAGNOSIS — I119 Hypertensive heart disease without heart failure: Secondary | ICD-10-CM | POA: Diagnosis not present

## 2012-03-08 DIAGNOSIS — E785 Hyperlipidemia, unspecified: Secondary | ICD-10-CM | POA: Diagnosis not present

## 2012-03-08 DIAGNOSIS — R0789 Other chest pain: Secondary | ICD-10-CM | POA: Diagnosis not present

## 2012-03-08 DIAGNOSIS — E1159 Type 2 diabetes mellitus with other circulatory complications: Secondary | ICD-10-CM | POA: Diagnosis not present

## 2012-03-19 DIAGNOSIS — R413 Other amnesia: Secondary | ICD-10-CM | POA: Diagnosis not present

## 2012-04-16 DIAGNOSIS — F068 Other specified mental disorders due to known physiological condition: Secondary | ICD-10-CM | POA: Diagnosis not present

## 2012-05-07 DIAGNOSIS — F068 Other specified mental disorders due to known physiological condition: Secondary | ICD-10-CM | POA: Diagnosis not present

## 2012-05-07 DIAGNOSIS — R413 Other amnesia: Secondary | ICD-10-CM | POA: Diagnosis not present

## 2012-05-16 ENCOUNTER — Encounter (HOSPITAL_COMMUNITY): Payer: Self-pay | Admitting: Unknown Physician Specialty

## 2012-05-16 ENCOUNTER — Emergency Department (HOSPITAL_COMMUNITY): Payer: Medicare Other

## 2012-05-16 ENCOUNTER — Emergency Department (HOSPITAL_COMMUNITY)
Admission: EM | Admit: 2012-05-16 | Discharge: 2012-05-17 | Disposition: A | Discharge status: Home / Self Care | Payer: Medicare Other | Attending: Emergency Medicine | Admitting: Emergency Medicine

## 2012-05-16 DIAGNOSIS — Z79899 Other long term (current) drug therapy: Secondary | ICD-10-CM | POA: Diagnosis not present

## 2012-05-16 DIAGNOSIS — R195 Other fecal abnormalities: Secondary | ICD-10-CM | POA: Insufficient documentation

## 2012-05-16 DIAGNOSIS — Z951 Presence of aortocoronary bypass graft: Secondary | ICD-10-CM | POA: Diagnosis not present

## 2012-05-16 DIAGNOSIS — I10 Essential (primary) hypertension: Secondary | ICD-10-CM | POA: Insufficient documentation

## 2012-05-16 DIAGNOSIS — S7001XA Contusion of right hip, initial encounter: Secondary | ICD-10-CM

## 2012-05-16 DIAGNOSIS — Z7901 Long term (current) use of anticoagulants: Secondary | ICD-10-CM | POA: Diagnosis not present

## 2012-05-16 DIAGNOSIS — S20219A Contusion of unspecified front wall of thorax, initial encounter: Secondary | ICD-10-CM | POA: Diagnosis not present

## 2012-05-16 DIAGNOSIS — Z9089 Acquired absence of other organs: Secondary | ICD-10-CM | POA: Diagnosis not present

## 2012-05-16 DIAGNOSIS — S79919A Unspecified injury of unspecified hip, initial encounter: Secondary | ICD-10-CM | POA: Diagnosis not present

## 2012-05-16 DIAGNOSIS — R52 Pain, unspecified: Secondary | ICD-10-CM | POA: Diagnosis not present

## 2012-05-16 DIAGNOSIS — F0390 Unspecified dementia without behavioral disturbance: Secondary | ICD-10-CM | POA: Diagnosis not present

## 2012-05-16 DIAGNOSIS — S298XXA Other specified injuries of thorax, initial encounter: Secondary | ICD-10-CM | POA: Diagnosis not present

## 2012-05-16 DIAGNOSIS — R109 Unspecified abdominal pain: Secondary | ICD-10-CM

## 2012-05-16 DIAGNOSIS — M25559 Pain in unspecified hip: Secondary | ICD-10-CM | POA: Diagnosis not present

## 2012-05-16 DIAGNOSIS — Y939 Activity, unspecified: Secondary | ICD-10-CM | POA: Insufficient documentation

## 2012-05-16 DIAGNOSIS — Y929 Unspecified place or not applicable: Secondary | ICD-10-CM | POA: Insufficient documentation

## 2012-05-16 DIAGNOSIS — W19XXXA Unspecified fall, initial encounter: Secondary | ICD-10-CM | POA: Insufficient documentation

## 2012-05-16 DIAGNOSIS — R079 Chest pain, unspecified: Secondary | ICD-10-CM | POA: Diagnosis not present

## 2012-05-16 DIAGNOSIS — S79929A Unspecified injury of unspecified thigh, initial encounter: Secondary | ICD-10-CM | POA: Diagnosis not present

## 2012-05-16 DIAGNOSIS — S7000XA Contusion of unspecified hip, initial encounter: Secondary | ICD-10-CM | POA: Diagnosis not present

## 2012-05-16 DIAGNOSIS — E119 Type 2 diabetes mellitus without complications: Secondary | ICD-10-CM | POA: Insufficient documentation

## 2012-05-16 DIAGNOSIS — S3981XA Other specified injuries of abdomen, initial encounter: Secondary | ICD-10-CM | POA: Diagnosis not present

## 2012-05-16 DIAGNOSIS — R1011 Right upper quadrant pain: Secondary | ICD-10-CM | POA: Diagnosis not present

## 2012-05-16 HISTORY — DX: Type 2 diabetes mellitus without complications: E11.9

## 2012-05-16 HISTORY — DX: Unspecified dementia, unspecified severity, without behavioral disturbance, psychotic disturbance, mood disturbance, and anxiety: F03.90

## 2012-05-16 LAB — COMPREHENSIVE METABOLIC PANEL
ALT: 11 U/L (ref 0–35)
Alkaline Phosphatase: 70 U/L (ref 39–117)
BUN: 26 mg/dL — ABNORMAL HIGH (ref 6–23)
CO2: 25 mEq/L (ref 19–32)
Chloride: 104 mEq/L (ref 96–112)
GFR calc Af Amer: 43 mL/min — ABNORMAL LOW (ref >90)
GFR calc non Af Amer: 37 mL/min — ABNORMAL LOW (ref >90)
Glucose, Bld: 148 mg/dL — ABNORMAL HIGH (ref 70–99)
Potassium: 3.9 mEq/L (ref 3.5–5.1)
Sodium: 140 mEq/L (ref 135–145)
Total Bilirubin: 0.3 mg/dL (ref 0.3–1.2)

## 2012-05-16 LAB — CBC WITH DIFFERENTIAL/PLATELET
Basophils Absolute: 0 10*3/uL (ref 0.0–0.1)
Eosinophils Absolute: 0.2 10*3/uL (ref 0.0–0.7)
Eosinophils Relative: 2 % (ref 0–5)
Lymphocytes Relative: 24 % (ref 12–46)
Lymphs Abs: 1.6 10*3/uL (ref 0.7–4.0)
MCV: 87.4 fL (ref 78.0–100.0)
Neutrophils Relative %: 63 % (ref 43–77)
Platelets: 203 10*3/uL (ref 150–400)
RBC: 3.74 MIL/uL — ABNORMAL LOW (ref 3.87–5.11)
RDW: 12.8 % (ref 11.5–15.5)
WBC: 6.6 10*3/uL (ref 4.0–10.5)

## 2012-05-16 MED ORDER — ONDANSETRON HCL 4 MG/2ML IJ SOLN
4.0000 mg | Freq: Once | INTRAMUSCULAR | Status: AC
  Administered 2012-05-17: 4 mg via INTRAVENOUS
  Filled 2012-05-16: qty 2

## 2012-05-16 MED ORDER — MORPHINE SULFATE 4 MG/ML IJ SOLN
4.0000 mg | Freq: Once | INTRAMUSCULAR | Status: AC
  Administered 2012-05-17: 4 mg via INTRAVENOUS
  Filled 2012-05-16: qty 1

## 2012-05-16 MED ORDER — SODIUM CHLORIDE 0.9 % IV SOLN
INTRAVENOUS | Status: DC
  Administered 2012-05-17: via INTRAVENOUS

## 2012-05-16 NOTE — ED Provider Notes (Signed)
History     CSN: [id number]  Arrival date & time 05/16/12  2059   First MD Initiated Contact with Patient 05/16/12 2141      Chief Complaint  Patient presents with  . Fall    (Consider location/radiation/quality/duration/timing/severity/associated sxs/prior treatment) HPI Comments: [name] is a 78 y.o. Female who fell 2 days ago, injuring her right chest and right hip. Since that time. She has been able to walk very enema using a cane. She came here tonight for evaluation, by EMS. She denies injuring her head, back, or left side. She does not know why she fell. She lives alone. There are no known modifying factors.  Patient is a 78 y.o. female presenting with fall. The history is provided by the patient.  Fall    Past Medical History  Diagnosis Date  . Hypertension   . Diabetes mellitus without complication   . Dementia     Past Surgical History  Procedure Date  . Cardiac surgery   . Cabg x4   . Tonsillectomy   . Cholecystectomy     History reviewed. No pertinent family history.  History  Substance Use Topics  . Smoking status: Never Smoker   . Smokeless tobacco: Not on file  . Alcohol Use: No    OB History    Grav Para Term Preterm Abortions TAB SAB Ect Mult Living                  Review of Systems  All other systems reviewed and are negative.    Allergies  Clindamycin/lincomycin  Home Medications   Current Outpatient Rx  Name  Route  Sig  Dispense  Refill  . CLOPIDOGREL BISULFATE 75 MG PO TABS   Oral   Take 75 mg by mouth daily.         . DONEPEZIL HCL 10 MG PO TABS   Oral   Take 10 mg by mouth at bedtime as needed. For memory loss         . EZETIMIBE 10 MG PO TABS   Oral   Take 10 mg by mouth daily.         . FUROSEMIDE 20 MG PO TABS   Oral   Take 20 mg by mouth 2 (two) times daily.         Marland Kitchen GABAPENTIN 300 MG PO CAPS   Oral   Take 300 mg by mouth 2 (two) times daily.         Marland Kitchen GLIMEPIRIDE 2 MG PO TABS  Oral   Take 1 mg by mouth daily before breakfast.         . HYDRALAZINE HCL 50 MG PO TABS   Oral   Take 50 mg by mouth 2 (two) times daily.         . ISOSORBIDE MONONITRATE ER 30 MG PO TB24   Oral   Take 30 mg by mouth 2 (two) times daily.         Marland Kitchen LOSARTAN POTASSIUM 50 MG PO TABS   Oral   Take 50 mg by mouth 2 (two) times daily.          Marland Kitchen METOPROLOL TARTRATE 25 MG PO TABS   Oral   Take 25 mg by mouth 2 (two) times daily.         Marland Kitchen SIMVASTATIN 40 MG PO TABS   Oral   Take 40 mg by mouth every evening.  BP 173/64  Pulse 73  Temp 98.6 F (37 C) (Oral)  Resp 16  SpO2 96%  Physical Exam  Nursing note and vitals reviewed. Constitutional: She is oriented to person, place, and time. She appears well-developed.       Elderly, frail  HENT:  Head: Normocephalic and atraumatic.  Eyes: Conjunctivae normal and EOM are normal. Pupils are equal, round, and reactive to light.  Neck: Normal range of motion and phonation normal. Neck supple.  Cardiovascular: Normal rate, regular rhythm and intact distal pulses.   Pulmonary/Chest: Effort normal and breath sounds normal. She exhibits no tenderness.       Tenderness with ecchymosis, right lateral chest wall. No associated crepitation or deformity  Abdominal: Soft. She exhibits no distension. There is tenderness (moderate right upper quadrant). There is no guarding.  Musculoskeletal:       Decreased passive range of motion right hip, secondary to pain. Tender to palpation. Right hip area.  Neurological: She is alert and oriented to person, place, and time. She has normal strength. She exhibits normal muscle tone.  Skin: Skin is warm and dry.  Psychiatric: She has a normal mood and affect. Her behavior is normal. Judgment and thought content normal.    ED Course  Procedures (including critical care time)  Labs Reviewed  CBC WITH DIFFERENTIAL - Abnormal; Notable for the following:    RBC 3.74 (*)     Hemoglobin  10.8 (*)     HCT 32.7 (*)     All other components within normal limits  COMPREHENSIVE METABOLIC PANEL - Abnormal; Notable for the following:    Glucose, Bld 148 (*)     BUN 26 (*)     Creatinine, Ser 1.30 (*)     GFR calc non Af Amer 37 (*)     GFR calc Af Amer 43 (*)     All other components within normal limits  URINALYSIS, ROUTINE W REFLEX MICROSCOPIC  URINE CULTURE   Dg Ribs Unilateral W/chest Right  05/16/2012  *RADIOLOGY REPORT*  Clinical Data: Status post fall several days ago; right posterior rib pain.  RIGHT RIBS AND CHEST - 3+ VIEW  Comparison: Chest radiograph performed 06/05/2011  Findings: No displaced rib fractures are seen.  The lungs are well-aerated and clear.  There is no evidence of focal opacification, pleural effusion or pneumothorax.  The left costophrenic angle is incompletely imaged on this study.  The cardiomediastinal silhouette is normal in size; the patient is status post median sternotomy, with scattered postoperative change along the left side of mediastinum.  No acute osseous abnormalities are seen.  Bilateral breast implants are noted.  Clips are noted within the right upper quadrant, reflecting prior cholecystectomy.  IMPRESSION:  1.  No displaced rib fractures seen. 2.  No acute cardiopulmonary process identified.   Original Report Authenticated By: Tonia Ghent, M.D.    Dg Hip Complete Right  05/17/2012  *RADIOLOGY REPORT*  Clinical Data: Status post fall; persistent right hip pain.  RIGHT HIP - COMPLETE 2+ VIEW  Comparison: None.  Findings: There is no evidence of fracture or dislocation.  Both femoral heads are seated normally within their respective acetabula.  The proximal right femur appears intact.  Mild degenerative change is noted at the lower lumbar spine.  Mild sclerotic change is seen at the sacroiliac joints.  The visualized bowel gas pattern is grossly unremarkable in appearance.  Contrast is seen within the ureters and bladder. Postoperative  change is noted at both inguinal regions.  IMPRESSION: No evidence of fracture or dislocation.   Original Report Authenticated By: Tonia Ghent, M.D.      1. Contusion, chest wall   2. Contusion of right hip   3. Abdominal pain       MDM  Fall, without fracture. Doubt serious internal injury.   CT abdomen pelvis pending, 00:54- care transferred to Dr. Dierdre Highman, to review after return of CT, and urinalysis         Flint Melter, MD 05/17/12 [phone]

## 2012-05-16 NOTE — ED Notes (Signed)
Patient arrived via [facility]. Patient suffered a fall on Tuesday and did not seek medical attention. Patient was brought to the ER because of increased pain, decreased mobility and is having black tarry stools. Patient is poor historian due to dementia. No family at bedside.

## 2012-05-16 NOTE — ED Notes (Signed)
Patient transported to CT and XR 

## 2012-05-17 ENCOUNTER — Encounter (HOSPITAL_COMMUNITY): Payer: Self-pay | Admitting: Radiology

## 2012-05-17 ENCOUNTER — Emergency Department (HOSPITAL_COMMUNITY): Payer: Medicare Other

## 2012-05-17 DIAGNOSIS — F0390 Unspecified dementia without behavioral disturbance: Secondary | ICD-10-CM | POA: Diagnosis not present

## 2012-05-17 DIAGNOSIS — S3981XA Other specified injuries of abdomen, initial encounter: Secondary | ICD-10-CM | POA: Diagnosis not present

## 2012-05-17 DIAGNOSIS — K921 Melena: Secondary | ICD-10-CM | POA: Diagnosis not present

## 2012-05-17 DIAGNOSIS — R071 Chest pain on breathing: Secondary | ICD-10-CM | POA: Diagnosis not present

## 2012-05-17 DIAGNOSIS — R109 Unspecified abdominal pain: Secondary | ICD-10-CM | POA: Diagnosis not present

## 2012-05-17 LAB — URINALYSIS, ROUTINE W REFLEX MICROSCOPIC
Glucose, UA: NEGATIVE mg/dL
Ketones, ur: NEGATIVE mg/dL
Leukocytes, UA: NEGATIVE
Nitrite: NEGATIVE
Protein, ur: NEGATIVE mg/dL
pH: 7 (ref 5.0–8.0)

## 2012-05-17 MED ORDER — TRAMADOL HCL 50 MG PO TABS: 50.0000 mg | ORAL_TABLET | Freq: Four times a day (QID) | ORAL | Status: DC | PRN

## 2012-05-17 MED ORDER — IOHEXOL 300 MG/ML  SOLN
80.0000 mL | Freq: Once | INTRAMUSCULAR | Status: AC | PRN
  Administered 2012-05-17: 80 mL via INTRAVENOUS

## 2012-05-17 NOTE — ED Notes (Signed)
Pt son phoned home due to patient being discharged. Pt and family explained earlier in the shift around 1:30 the possibility of being discharged due to the nature of the patient having no injuries. Son states he is on his way to get his mom. Pt discharged to waiting room fully clothed and given a warm blanket for comfort. Charge nurse notified and patient sitting within eye and walking distance from main triage desk

## 2012-05-18 LAB — URINE CULTURE

## 2012-05-23 ENCOUNTER — Other Ambulatory Visit: Payer: Self-pay

## 2012-05-23 ENCOUNTER — Observation Stay (HOSPITAL_COMMUNITY): Payer: Medicare Other

## 2012-05-23 ENCOUNTER — Encounter (HOSPITAL_BASED_OUTPATIENT_CLINIC_OR_DEPARTMENT_OTHER): Payer: Self-pay | Admitting: Emergency Medicine

## 2012-05-23 ENCOUNTER — Emergency Department (HOSPITAL_BASED_OUTPATIENT_CLINIC_OR_DEPARTMENT_OTHER): Payer: Medicare Other

## 2012-05-23 ENCOUNTER — Inpatient Hospital Stay (HOSPITAL_BASED_OUTPATIENT_CLINIC_OR_DEPARTMENT_OTHER)
Admission: EM | Admit: 2012-05-23 | Discharge: 2012-05-25 | DRG: 689 | Disposition: A | Discharge status: Home / Self Care | Payer: Medicare Other | Attending: Internal Medicine | Admitting: Internal Medicine

## 2012-05-23 DIAGNOSIS — Z7902 Long term (current) use of antithrombotics/antiplatelets: Secondary | ICD-10-CM

## 2012-05-23 DIAGNOSIS — Z9181 History of falling: Secondary | ICD-10-CM | POA: Diagnosis not present

## 2012-05-23 DIAGNOSIS — Z87891 Personal history of nicotine dependence: Secondary | ICD-10-CM

## 2012-05-23 DIAGNOSIS — I119 Hypertensive heart disease without heart failure: Secondary | ICD-10-CM

## 2012-05-23 DIAGNOSIS — E1169 Type 2 diabetes mellitus with other specified complication: Secondary | ICD-10-CM | POA: Diagnosis present

## 2012-05-23 DIAGNOSIS — I252 Old myocardial infarction: Secondary | ICD-10-CM | POA: Diagnosis not present

## 2012-05-23 DIAGNOSIS — E785 Hyperlipidemia, unspecified: Secondary | ICD-10-CM | POA: Diagnosis present

## 2012-05-23 DIAGNOSIS — R52 Pain, unspecified: Secondary | ICD-10-CM | POA: Diagnosis not present

## 2012-05-23 DIAGNOSIS — I350 Nonrheumatic aortic (valve) stenosis: Secondary | ICD-10-CM | POA: Insufficient documentation

## 2012-05-23 DIAGNOSIS — Z23 Encounter for immunization: Secondary | ICD-10-CM

## 2012-05-23 DIAGNOSIS — I509 Heart failure, unspecified: Secondary | ICD-10-CM | POA: Diagnosis present

## 2012-05-23 DIAGNOSIS — W19XXXA Unspecified fall, initial encounter: Secondary | ICD-10-CM

## 2012-05-23 DIAGNOSIS — I130 Hypertensive heart and chronic kidney disease with heart failure and stage 1 through stage 4 chronic kidney disease, or unspecified chronic kidney disease: Secondary | ICD-10-CM | POA: Diagnosis present

## 2012-05-23 DIAGNOSIS — E1159 Type 2 diabetes mellitus with other circulatory complications: Secondary | ICD-10-CM | POA: Diagnosis present

## 2012-05-23 DIAGNOSIS — R5381 Other malaise: Secondary | ICD-10-CM | POA: Diagnosis not present

## 2012-05-23 DIAGNOSIS — R5383 Other fatigue: Secondary | ICD-10-CM | POA: Diagnosis not present

## 2012-05-23 DIAGNOSIS — I359 Nonrheumatic aortic valve disorder, unspecified: Secondary | ICD-10-CM | POA: Diagnosis not present

## 2012-05-23 DIAGNOSIS — Z951 Presence of aortocoronary bypass graft: Secondary | ICD-10-CM | POA: Diagnosis not present

## 2012-05-23 DIAGNOSIS — I5031 Acute diastolic (congestive) heart failure: Secondary | ICD-10-CM | POA: Diagnosis not present

## 2012-05-23 DIAGNOSIS — I2510 Atherosclerotic heart disease of native coronary artery without angina pectoris: Secondary | ICD-10-CM | POA: Diagnosis not present

## 2012-05-23 DIAGNOSIS — I10 Essential (primary) hypertension: Secondary | ICD-10-CM | POA: Diagnosis not present

## 2012-05-23 DIAGNOSIS — R110 Nausea: Secondary | ICD-10-CM | POA: Diagnosis not present

## 2012-05-23 DIAGNOSIS — I5032 Chronic diastolic (congestive) heart failure: Secondary | ICD-10-CM | POA: Diagnosis not present

## 2012-05-23 DIAGNOSIS — E119 Type 2 diabetes mellitus without complications: Secondary | ICD-10-CM

## 2012-05-23 DIAGNOSIS — M25559 Pain in unspecified hip: Secondary | ICD-10-CM | POA: Diagnosis not present

## 2012-05-23 DIAGNOSIS — R079 Chest pain, unspecified: Secondary | ICD-10-CM | POA: Diagnosis not present

## 2012-05-23 DIAGNOSIS — N390 Urinary tract infection, site not specified: Secondary | ICD-10-CM | POA: Diagnosis not present

## 2012-05-23 DIAGNOSIS — K219 Gastro-esophageal reflux disease without esophagitis: Secondary | ICD-10-CM | POA: Diagnosis present

## 2012-05-23 DIAGNOSIS — I214 Non-ST elevation (NSTEMI) myocardial infarction: Secondary | ICD-10-CM | POA: Diagnosis not present

## 2012-05-23 DIAGNOSIS — G609 Hereditary and idiopathic neuropathy, unspecified: Secondary | ICD-10-CM | POA: Diagnosis present

## 2012-05-23 DIAGNOSIS — I219 Acute myocardial infarction, unspecified: Secondary | ICD-10-CM

## 2012-05-23 DIAGNOSIS — F0390 Unspecified dementia without behavioral disturbance: Secondary | ICD-10-CM

## 2012-05-23 DIAGNOSIS — Z79899 Other long term (current) drug therapy: Secondary | ICD-10-CM

## 2012-05-23 DIAGNOSIS — I739 Peripheral vascular disease, unspecified: Secondary | ICD-10-CM

## 2012-05-23 DIAGNOSIS — A498 Other bacterial infections of unspecified site: Secondary | ICD-10-CM | POA: Diagnosis not present

## 2012-05-23 DIAGNOSIS — R296 Repeated falls: Secondary | ICD-10-CM

## 2012-05-23 DIAGNOSIS — N183 Chronic kidney disease, stage 3 unspecified: Secondary | ICD-10-CM | POA: Diagnosis present

## 2012-05-23 DIAGNOSIS — R112 Nausea with vomiting, unspecified: Secondary | ICD-10-CM | POA: Diagnosis not present

## 2012-05-23 HISTORY — DX: Gastro-esophageal reflux disease without esophagitis: K21.9

## 2012-05-23 HISTORY — DX: Atherosclerotic heart disease of native coronary artery without angina pectoris: I25.10

## 2012-05-23 HISTORY — DX: Chronic diastolic (congestive) heart failure: I50.32

## 2012-05-23 HISTORY — DX: Nonrheumatic aortic valve disorder, unspecified: I35.9

## 2012-05-23 HISTORY — DX: Hyperlipidemia, unspecified: E78.5

## 2012-05-23 HISTORY — DX: Hypertensive heart disease without heart failure: I11.9

## 2012-05-23 HISTORY — DX: Polyneuropathy, unspecified: G62.9

## 2012-05-23 HISTORY — DX: Peripheral vascular disease, unspecified: I73.9

## 2012-05-23 LAB — URINALYSIS, ROUTINE W REFLEX MICROSCOPIC
Glucose, UA: NEGATIVE mg/dL
Specific Gravity, Urine: 1.017 (ref 1.005–1.030)
pH: 5.5 (ref 5.0–8.0)

## 2012-05-23 LAB — URINE MICROSCOPIC-ADD ON

## 2012-05-23 LAB — GLUCOSE, CAPILLARY
Glucose-Capillary: 149 mg/dL — ABNORMAL HIGH (ref 70–99)
Glucose-Capillary: 67 mg/dL — ABNORMAL LOW (ref 70–99)
Glucose-Capillary: 77 mg/dL (ref 70–99)

## 2012-05-23 LAB — CREATININE, SERUM
GFR calc Af Amer: 45 mL/min — ABNORMAL LOW (ref >90)
GFR calc non Af Amer: 39 mL/min — ABNORMAL LOW (ref >90)

## 2012-05-23 LAB — OCCULT BLOOD X 1 CARD TO LAB, STOOL: Fecal Occult Bld: NEGATIVE

## 2012-05-23 LAB — TROPONIN I
Troponin I: 1.43 ng/mL (ref <0.30)
Troponin I: 1.51 ng/mL (ref <0.30)
Troponin I: 1.24 ng/mL (ref <0.30)

## 2012-05-23 LAB — CBC
HCT: 29.7 % — ABNORMAL LOW (ref 36.0–46.0)
Hemoglobin: 10.3 g/dL — ABNORMAL LOW (ref 12.0–15.0)
MCH: 29.9 pg (ref 26.0–34.0)
MCHC: 33.3 g/dL (ref 30.0–36.0)
Platelets: 162 10*3/uL (ref 150–400)
RBC: 3.44 MIL/uL — ABNORMAL LOW (ref 3.87–5.11)
RDW: 13 % (ref 11.5–15.5)

## 2012-05-23 LAB — BASIC METABOLIC PANEL
CO2: 20 mEq/L (ref 19–32)
Calcium: 8.9 mg/dL (ref 8.4–10.5)
Glucose, Bld: 61 mg/dL — ABNORMAL LOW (ref 70–99)
Potassium: 3.6 mEq/L (ref 3.5–5.1)
Sodium: 137 mEq/L (ref 135–145)

## 2012-05-23 LAB — INFLUENZA PANEL BY PCR (TYPE A & B): H1N1 flu by pcr: NOT DETECTED

## 2012-05-23 MED ORDER — ACETAMINOPHEN 325 MG PO TABS
650.0000 mg | ORAL_TABLET | Freq: Four times a day (QID) | ORAL | Status: DC | PRN
  Filled 2012-05-23: qty 2

## 2012-05-23 MED ORDER — HEPARIN SODIUM (PORCINE) 5000 UNIT/ML IJ SOLN
5000.0000 [IU] | Freq: Three times a day (TID) | INTRAMUSCULAR | Status: DC
  Administered 2012-05-23 – 2012-05-25 (×7): 5000 [IU] via SUBCUTANEOUS
  Filled 2012-05-23 (×10): qty 1

## 2012-05-23 MED ORDER — INFLUENZA VIRUS VACC SPLIT PF IM SUSP
0.5000 mL | INTRAMUSCULAR | Status: AC
  Administered 2012-05-24: 0.5 mL via INTRAMUSCULAR
  Filled 2012-05-23: qty 0.5

## 2012-05-23 MED ORDER — ACETAMINOPHEN 650 MG RE SUPP
650.0000 mg | Freq: Four times a day (QID) | RECTAL | Status: DC | PRN
Start: 2012-05-23 — End: 2012-05-23

## 2012-05-23 MED ORDER — GABAPENTIN 300 MG PO CAPS
300.0000 mg | ORAL_CAPSULE | Freq: Two times a day (BID) | ORAL | Status: DC
  Administered 2012-05-23 (×2): 300 mg via ORAL
  Filled 2012-05-23 (×4): qty 1

## 2012-05-23 MED ORDER — PNEUMOCOCCAL VAC POLYVALENT 25 MCG/0.5ML IJ INJ
0.5000 mL | INJECTION | INTRAMUSCULAR | Status: AC
  Administered 2012-05-24: 0.5 mL via INTRAMUSCULAR
  Filled 2012-05-23: qty 0.5

## 2012-05-23 MED ORDER — ONDANSETRON HCL 4 MG/2ML IJ SOLN
4.0000 mg | Freq: Four times a day (QID) | INTRAMUSCULAR | Status: DC | PRN
  Administered 2012-05-24: 4 mg via INTRAVENOUS
  Filled 2012-05-23: qty 2

## 2012-05-23 MED ORDER — GUAIFENESIN-DM 100-10 MG/5ML PO SYRP: 5.0000 mL | ORAL_SOLUTION | ORAL | Status: DC | PRN

## 2012-05-23 MED ORDER — SENNOSIDES-DOCUSATE SODIUM 8.6-50 MG PO TABS
1.0000 | ORAL_TABLET | Freq: Every evening | ORAL | Status: DC | PRN
  Filled 2012-05-23: qty 1

## 2012-05-23 MED ORDER — ASPIRIN 81 MG PO CHEW
81.0000 mg | CHEWABLE_TABLET | Freq: Every day | ORAL | Status: DC
  Administered 2012-05-23 – 2012-05-25 (×3): 81 mg via ORAL
  Filled 2012-05-23 (×2): qty 1

## 2012-05-23 MED ORDER — SODIUM CHLORIDE 0.9 % IV SOLN
INTRAVENOUS | Status: DC
  Administered 2012-05-23: 11:00:00 via INTRAVENOUS

## 2012-05-23 MED ORDER — TRAMADOL HCL 50 MG PO TABS
50.0000 mg | ORAL_TABLET | Freq: Four times a day (QID) | ORAL | Status: DC | PRN
  Administered 2012-05-24: 50 mg via ORAL
  Filled 2012-05-23 (×2): qty 1

## 2012-05-23 MED ORDER — ISOSORBIDE MONONITRATE ER 30 MG PO TB24
30.0000 mg | ORAL_TABLET | Freq: Two times a day (BID) | ORAL | Status: DC
  Administered 2012-05-23 – 2012-05-25 (×5): 30 mg via ORAL
  Filled 2012-05-23 (×6): qty 1

## 2012-05-23 MED ORDER — EZETIMIBE 10 MG PO TABS
10.0000 mg | ORAL_TABLET | Freq: Every day | ORAL | Status: DC
  Administered 2012-05-23 – 2012-05-24 (×2): 10 mg via ORAL
  Filled 2012-05-23 (×2): qty 1

## 2012-05-23 MED ORDER — ACETAMINOPHEN 650 MG RE SUPP
RECTAL | Status: AC
  Administered 2012-05-23: 650 mg via RECTAL
  Filled 2012-05-23: qty 1

## 2012-05-23 MED ORDER — LOSARTAN POTASSIUM 50 MG PO TABS
50.0000 mg | ORAL_TABLET | Freq: Two times a day (BID) | ORAL | Status: DC
  Administered 2012-05-23 – 2012-05-25 (×5): 50 mg via ORAL
  Filled 2012-05-23 (×6): qty 1

## 2012-05-23 MED ORDER — ALBUTEROL SULFATE (5 MG/ML) 0.5% IN NEBU: 2.5000 mg | INHALATION_SOLUTION | RESPIRATORY_TRACT | Status: DC | PRN

## 2012-05-23 MED ORDER — HYDROCODONE-ACETAMINOPHEN 5-325 MG PO TABS
1.0000 | ORAL_TABLET | ORAL | Status: DC | PRN
  Administered 2012-05-23 – 2012-05-24 (×3): 2 via ORAL
  Administered 2012-05-25: 1 via ORAL
  Administered 2012-05-25: 2 via ORAL
  Filled 2012-05-23 (×2): qty 2
  Filled 2012-05-23: qty 1
  Filled 2012-05-23 (×2): qty 2

## 2012-05-23 MED ORDER — HYDRALAZINE HCL 50 MG PO TABS
50.0000 mg | ORAL_TABLET | Freq: Two times a day (BID) | ORAL | Status: DC
  Administered 2012-05-23 – 2012-05-25 (×5): 50 mg via ORAL
  Filled 2012-05-23 (×6): qty 1

## 2012-05-23 MED ORDER — ACETAMINOPHEN 650 MG RE SUPP
650.0000 mg | Freq: Once | RECTAL | Status: AC
  Administered 2012-05-23: 650 mg via RECTAL

## 2012-05-23 MED ORDER — ONDANSETRON HCL 4 MG PO TABS: 4.0000 mg | ORAL_TABLET | Freq: Four times a day (QID) | ORAL | Status: DC | PRN

## 2012-05-23 MED ORDER — CLOPIDOGREL BISULFATE 75 MG PO TABS
75.0000 mg | ORAL_TABLET | Freq: Every day | ORAL | Status: DC
  Administered 2012-05-23 – 2012-05-25 (×3): 75 mg via ORAL
  Filled 2012-05-23 (×4): qty 1

## 2012-05-23 MED ORDER — DEXTROSE-NACL 5-0.45 % IV SOLN
INTRAVENOUS | Status: DC
  Administered 2012-05-23: 18:00:00 via INTRAVENOUS

## 2012-05-23 MED ORDER — INSULIN ASPART 100 UNIT/ML ~~LOC~~ SOLN
0.0000 [IU] | Freq: Three times a day (TID) | SUBCUTANEOUS | Status: DC
  Administered 2012-05-23: 1 [IU] via SUBCUTANEOUS

## 2012-05-23 MED ORDER — CEFTRIAXONE SODIUM 1 G IJ SOLR
1.0000 g | INTRAMUSCULAR | Status: DC
  Administered 2012-05-23 – 2012-05-25 (×3): 1 g via INTRAVENOUS
  Filled 2012-05-23 (×3): qty 10

## 2012-05-23 MED ORDER — SODIUM CHLORIDE 0.9 % IJ SOLN
3.0000 mL | Freq: Two times a day (BID) | INTRAMUSCULAR | Status: DC
  Administered 2012-05-23 – 2012-05-25 (×3): 3 mL via INTRAVENOUS

## 2012-05-23 MED ORDER — ACETAMINOPHEN 325 MG PO TABS
650.0000 mg | ORAL_TABLET | Freq: Four times a day (QID) | ORAL | Status: DC | PRN
  Administered 2012-05-23: 650 mg via ORAL
  Filled 2012-05-23: qty 2

## 2012-05-23 MED ORDER — DONEPEZIL HCL 10 MG PO TABS
10.0000 mg | ORAL_TABLET | Freq: Every day | ORAL | Status: DC
  Administered 2012-05-23 – 2012-05-24 (×2): 10 mg via ORAL
  Filled 2012-05-23 (×3): qty 1

## 2012-05-23 MED ORDER — SIMVASTATIN 40 MG PO TABS
40.0000 mg | ORAL_TABLET | Freq: Every day | ORAL | Status: DC
  Administered 2012-05-23 – 2012-05-24 (×2): 40 mg via ORAL
  Filled 2012-05-23 (×3): qty 1

## 2012-05-23 MED ORDER — METOPROLOL TARTRATE 25 MG PO TABS
25.0000 mg | ORAL_TABLET | Freq: Two times a day (BID) | ORAL | Status: DC
  Administered 2012-05-23 – 2012-05-24 (×3): 25 mg via ORAL
  Filled 2012-05-23 (×4): qty 1

## 2012-05-23 MED ORDER — GLIMEPIRIDE 1 MG PO TABS
1.0000 mg | ORAL_TABLET | Freq: Every day | ORAL | Status: DC
  Administered 2012-05-23: 1 mg via ORAL
  Filled 2012-05-23 (×2): qty 1

## 2012-05-23 MED ORDER — DEXTROSE 5 % IV SOLN
1.0000 g | Freq: Once | INTRAVENOUS | Status: AC
  Administered 2012-05-23: 1 g via INTRAVENOUS
  Filled 2012-05-23: qty 10

## 2012-05-23 NOTE — Progress Notes (Addendum)
Cardiology Consult Note  Admit date: 05/23/2012 Name: [name] 79 y.o.  female DOB:  [birth year] MRN:  [id number]  Today's date:  05/23/2012  Referring Physician:    [facility]  Primary Physician:   Dr. Lupita Raider  Reason for Consultation:    Abnormal troponin  IMPRESSIONS: 1. Abnormal elevation of cardiac enzymes (troponin) likely due to demand ischemia. I do not think this represents a primary cardiac event but more likely secondary to her fever and sepsis 2. Coronary artery disease with previous bypass grafting 4 times previously. Previous catheterization showed patency of her two mammary bypasses and she had some disease involving the right coronary artery that could be a potential source of ischemia. She has mild chronic chest pain on a normal basis. 3. Hypertensive heart disease 4. Hyperlipidemia 5. Peripheral vascular disease with previous carotid endarterectomy and generalized vascular disease 6. Dementia 7. Mild chronic kidney disease 8. Urinary tract infection with probable sepsis 9. Recent fall   RECOMMENDATION: Cycle enzymes. Would check LV function with echocardiogram. She does have a prior history of diastolic heart failure but she has been volume overloaded in the past so will need to be careful with fluid administration. Treat primary medical problems including sepsis. Continue her prior cardiac medications  HISTORY: This 79 year-old female is well-known to me. She has a history of coronary artery disease and has some chronic chest pain but has been relatively stable. She has had previous coronary bypass grafting 4 times with the last being in 1999. In 2007 at catheterization showed patent mammary bypass and did have some potential sites of ischemia in the circumflex system as well as the right coronary artery that was treated medically. She normally gets along fairly well but does have some mild dementia. She states that her dog tripped her and that she  fell. She was seen in the emergency room on the 26th and had a catheterized urine done that was no growth. She came back to the hospital severely weak and mildly confused and was found to have a urinary tract infection and is admitted to the hospital. She denies any chest pain or shortness of breath at the present time and was oriented and knew who I was. Serial cardiac enzymes were obtained and a troponin is mildly elevated and I was asked to see her in consultation. She is currently stable in the setting of hypotension and has not had any chest pain or new EKG changes. She has baseline ST depression on her EKG.  Past Medical History  Diagnosis Date  . Dementia   . Coronary artery disease   . Peripheral vascular disease   . Hypertensive heart disease without CHF   . Hyperlipidemia   . Chronic diastolic heart failure   . Aortic valve disorder   . GERD (gastroesophageal reflux disease)   . Peripheral neuropathy       Past Surgical History  Procedure Date  . Tonsillectomy   . Laparoscopic cholecystectomy 2001  . Coronary artery bypass graft 1981, [id number]    By Dr. Maudie Mercury  . Repair of lymphocoele   . Carotid endarterectomy 12/2001  . Repair of left iliac psuedoaneurysm following cath 2006  . Breast enhancement surgery     Allergies:  is allergic to clindamycin/lincomycin.   Medications: Prior to Admission medications   Medication Sig Start Date End Date Taking? Authorizing Provider  clopidogrel (PLAVIX) 75 MG tablet Take 75 mg by mouth daily.   Yes Historical Provider, MD  donepezil (ARICEPT)  10 MG tablet Take 10 mg by mouth at bedtime as needed. For memory loss   Yes Historical Provider, MD  ezetimibe (ZETIA) 10 MG tablet Take 10 mg by mouth daily.   Yes Historical Provider, MD  furosemide (LASIX) 20 MG tablet Take 20 mg by mouth 2 (two) times daily.   Yes Historical Provider, MD  gabapentin (NEURONTIN) 300 MG capsule Take 300 mg by mouth 2 (two) times daily.   Yes  Historical Provider, MD  glimepiride (AMARYL) 2 MG tablet Take 1 mg by mouth daily before breakfast.   Yes Historical Provider, MD  hydrALAZINE (APRESOLINE) 50 MG tablet Take 50 mg by mouth 2 (two) times daily.   Yes Historical Provider, MD  isosorbide mononitrate (IMDUR) 30 MG 24 hr tablet Take 30 mg by mouth 2 (two) times daily.   Yes Historical Provider, MD  losartan (COZAAR) 50 MG tablet Take 50 mg by mouth 2 (two) times daily.    Yes Historical Provider, MD  metoprolol tartrate (LOPRESSOR) 25 MG tablet Take 25 mg by mouth 2 (two) times daily.   Yes Historical Provider, MD  simvastatin (ZOCOR) 40 MG tablet Take 40 mg by mouth every evening.   Yes Historical Provider, MD  traMADol (ULTRAM) 50 MG tablet Take 1 tablet (50 mg total) by mouth every 6 (six) hours as needed for pain. 05/17/12  Yes Sunnie Nielsen, MD   Family History: Family Status  Relation Status Death Age  . Father Deceased     pneumonia  . Mother Deceased 63    died following surgery  . Brother Deceased     died of CAD  . Brother Deceased     died of CAD  . Brother Deceased     died of CAD  . Sister Deceased     died of blood clots   Social History:   reports that she has quit smoking. She has never used smokeless tobacco. She reports that she does not drink alcohol or use illicit drugs.   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 She has some mild memory loss in the past. She has a history of reflux esophagitis. She has had significant urinary burning as well as frequency. She has some mild bilateral leg pain. Other than as noted above the remainder of the review of systems is unremarkable.  Physical Exam: BP 126/57  Pulse 72  Temp 99.4 F (37.4 C) (Oral)  Resp 18  Ht 5\' 6"  (1.676 m)  Wt 59.2 kg (130 lb 8.2 oz)  BMI 21.07 kg/m2  SpO2 98%  General  appearance: alert, cooperative, appears stated age, no distress and She was oriented, knew who I was, was not oriented to date. Head: Normocephalic, without obvious abnormality, atraumatic Eyes: conjunctivae/corneas clear. PERRL, EOM's intact. Fundi not examined  Neck: no adenopathy, no carotid bruit, no JVD, supple, symmetrical, trachea midline and Healed left carotid endarterectomy scar Lungs: clear to auscultation bilaterally, prior median sternotomy scar Breasts: Deferred, prior breast implants Heart: Regular rate and rhythm, normal S1-S2, no S3, 2/6 systolic murmur at aortic area with some radiation to carotids Abdomen: soft, non-tender; bowel sounds normal; no masses,  no organomegaly Pelvic: deferred Extremities: extremities normal, atraumatic, no cyanosis or edema and Bilateral scars of previous saphenous vein harvesting Pulses: Soft bilateral femoral bruits, scars over femoral  areas, 2+ dorsalis pedis  Skin: Skin color, texture, turgor normal. No rashes or lesions Neurologic: Grossly normal   Labs: CBC  Basename 05/23/12 1300  WBC 9.6  RBC 3.38*  HGB 9.9*  HCT 29.7*  PLT 162  MCV 87.9  MCH 29.3  MCHC 33.3  RDW 13.0  LYMPHSABS --  MONOABS --  EOSABS --  BASOSABS --   CMP   Basename 05/23/12 1000 05/23/12 0155  NA -- 137  K -- 3.6  CL -- 102  CO2 -- 20  GLUCOSE -- 61*  BUN -- 24*  CREATININE 1.24* --  CALCIUM -- 8.9  PROT -- --  ALBUMIN -- --  AST -- --  ALT -- --  ALKPHOS -- --  BILITOT -- --  GFRNONAA 39* --  GFRAA 45* --   BNP (last 3 results) No results found for this basename: PROBNP:3 in the last 8760 hours Cardiac Panel (last 3 results)  Basename 05/23/12 1715 05/23/12 1137 05/23/12 0155  CKTOTAL -- -- --  CKMB -- -- --  TROPONINI 1.51* 1.43* <0.30  RELINDX -- -- --     Radiology: No acute disease, no pneumonia, vascular calcification noted  EKG: ST depression and some minor T wave inversions  Signed:  W. Ashley Royalty MD  [facility]   Cardiology Consultant  05/23/2012, 6:16 PM

## 2012-05-23 NOTE — ED Notes (Signed)
Pt report given to Franky Macho, Charity fundraiser with [facility]

## 2012-05-23 NOTE — H&P (Addendum)
[facility]                                                                                    Patient Demographics  [name], is a 79 y.o. female  CSN: [id number]  MRN: [id number]  DOB - Oct 23, 1926  Admit Date - 05/23/2012  Outpatient Primary MD for the patient is Lupita Raider, MD   With History of -  Past Medical History  Diagnosis Date  . Hypertension   . Diabetes mellitus without complication   . Dementia   . MI (myocardial infarction)       Past Surgical History  Procedure Date  . Cardiac surgery   . Cabg x4   . Tonsillectomy   . Cholecystectomy     in for   Chief Complaint  Patient presents with  . Hip Pain  . Fall  . Chest Pain  . Weakness  . Fever     HPI  [name]  is a 79 y.o. female, with history of CAD status post CABG, dementia, hypertension and diabetes mellitus type 2 who lives alone and has been having dysuria along with couple of falls at home requiring ER visits, she [facility] last night after feeling weak and incurring another fall, patient was diagnosed with UTI, she was complaining of some flank/back pain for which x-rays of her hip were negative, she currently is mildly confused however she denies any subjective complaints except dull low back/flank pain.   She denies any fever chills, denies any chest pain or palpitations, denies any focal weakness or headache.    Review of Systems    In addition to the HPI above,  No Fever-chills, No Headache, No changes with Vision or hearing, No problems swallowing food or Liquids, No Chest pain, Cough or Shortness of Breath, No Abdominal pain, No Nausea or Vommitting, Bowel movements are regular, No Blood in stool or Urine, No dysuria, No new skin rashes or bruises, No new joints pains-aches,  No new weakness, tingling, numbness in any extremity, feels weak generally all over No recent weight gain or loss, No polyuria,  polydypsia or polyphagia, No significant Mental Stressors.  A full 10 point Review of Systems was done, except as stated above, all other Review of Systems were negative.   Social History History  Substance Use Topics  . Smoking status: Never Smoker   . Smokeless tobacco: Not on file  . Alcohol Use: No     Family History Denies any history of CAD in family  Prior to Admission medications   Medication Sig Start Date End Date Taking? Authorizing Provider  clopidogrel (PLAVIX) 75 MG tablet Take 75 mg by mouth daily.   Yes Historical Provider, MD  donepezil (ARICEPT) 10 MG tablet Take 10 mg by mouth at bedtime as needed. For memory loss   Yes Historical Provider, MD  ezetimibe (ZETIA) 10 MG tablet Take 10 mg by mouth daily.   Yes Historical Provider, MD  furosemide (LASIX) 20 MG tablet Take 20 mg by mouth 2 (two) times daily.   Yes Historical Provider, MD  gabapentin (NEURONTIN) 300 MG capsule Take 300 mg by  mouth 2 (two) times daily.   Yes Historical Provider, MD  glimepiride (AMARYL) 2 MG tablet Take 1 mg by mouth daily before breakfast.   Yes Historical Provider, MD  hydrALAZINE (APRESOLINE) 50 MG tablet Take 50 mg by mouth 2 (two) times daily.   Yes Historical Provider, MD  isosorbide mononitrate (IMDUR) 30 MG 24 hr tablet Take 30 mg by mouth 2 (two) times daily.   Yes Historical Provider, MD  losartan (COZAAR) 50 MG tablet Take 50 mg by mouth 2 (two) times daily.    Yes Historical Provider, MD  metoprolol tartrate (LOPRESSOR) 25 MG tablet Take 25 mg by mouth 2 (two) times daily.   Yes Historical Provider, MD  simvastatin (ZOCOR) 40 MG tablet Take 40 mg by mouth every evening.   Yes Historical Provider, MD  traMADol (ULTRAM) 50 MG tablet Take 1 tablet (50 mg total) by mouth every 6 (six) hours as needed for pain. 05/17/12  Yes Sunnie Nielsen, MD    Allergies  Allergen Reactions  . Clindamycin/Lincomycin Rash and Other (See Comments)    Heart problems     Physical  Exam  Vitals  Blood pressure 114/42, pulse 63, temperature 99 F (37.2 C), temperature source Oral, resp. rate 18, weight 59.2 kg (130 lb 8.2 oz), SpO2 99.00%.   1. General frail elderly white female lying in bed in NAD,    2. Normal affect and insight, Not Suicidal or Homicidal, Awake Alert, Oriented X 3.  3. No F.N deficits, ALL C.Nerves Intact, Strength 5/5 all 4 extremities, Sensation intact all 4 extremities, Plantars down going.  4. Ears and Eyes appear Normal, Conjunctivae clear, PERRLA. Moist Oral Mucosa.  5. Supple Neck, No JVD, No cervical lymphadenopathy appriciated, No Carotid Bruits.  6. Symmetrical Chest wall movement, Good air movement bilaterally, CTAB.  7. RRR, No Gallops, Rubs or Murmurs, No Parasternal Heave.  8. Positive Bowel Sounds, Abdomen Soft, Non tender, No organomegaly appriciated,No rebound -guarding or rigidity. Some flank tenderness questionable  9.  No Cyanosis, Normal Skin Turgor, No Skin Rash or Bruise.  10. Good muscle tone,  joints appear normal , no effusions, Normal ROM.  11. No Palpable Lymph Nodes in Neck or Axillae     Data Review  CBC  Lab 05/23/12 0155 05/16/12 2159  WBC 10.1 6.6  HGB 10.3* 10.8*  HCT 30.7* 32.7*  PLT 174 203  MCV 89.2 87.4  MCH 29.9 28.9  MCHC 33.6 33.0  RDW 12.6 12.8  LYMPHSABS -- 1.6  MONOABS -- 0.7  EOSABS -- 0.2  BASOSABS -- 0.0  BANDABS -- --   ------------------------------------------------------------------------------------------------------------------  Chemistries   Lab 05/23/12 0155 05/16/12 2159  NA 137 140  K 3.6 3.9  CL 102 104  CO2 20 25  GLUCOSE 61* 148*  BUN 24* 26*  CREATININE 1.30* 1.30*  CALCIUM 8.9 9.4  MG -- --  AST -- 18  ALT -- 11  ALKPHOS -- 70  BILITOT -- 0.3   ------------------------------------------------------------------------------------------------------------------ CrCl is unknown because there is no height on file for the current  visit. ------------------------------------------------------------------------------------------------------------------ No results found for this basename: TSH,T4TOTAL,FREET3,T3FREE,THYROIDAB in the last 72 hours   Coagulation profile No results found for this basename: INR:5,PROTIME:5 in the last 168 hours ------------------------------------------------------------------------------------------------------------------- No results found for this basename: DDIMER:2 in the last 72 hours -------------------------------------------------------------------------------------------------------------------  Cardiac Enzymes  Lab 05/23/12 0155  CKMB --  TROPONINI <0.30  MYOGLOBIN --   ------------------------------------------------------------------------------------------------------------------ No components found with this basename: POCBNP:3   ---------------------------------------------------------------------------------------------------------------  Urinalysis  Component Value Date/Time   COLORURINE AMBER* 05/23/2012 0257   APPEARANCEUR TURBID* 05/23/2012 0257   LABSPEC 1.017 05/23/2012 0257   PHURINE 5.5 05/23/2012 0257   GLUCOSEU NEGATIVE 05/23/2012 0257   HGBUR SMALL* 05/23/2012 0257   BILIRUBINUR NEGATIVE 05/23/2012 0257   KETONESUR 15* 05/23/2012 0257   PROTEINUR 100* 05/23/2012 0257   UROBILINOGEN 1.0 05/23/2012 0257   NITRITE POSITIVE* 05/23/2012 0257   LEUKOCYTESUR LARGE* 05/23/2012 0257    ----------------------------------------------------------------------------------------------------------------  Imaging results:   Dg Chest 2 View  05/23/2012  *RADIOLOGY REPORT*  Clinical Data: Status post fall; chest pain.  Weakness and fever.  CHEST - 2 VIEW  Comparison: Chest radiograph performed 05/16/2012  Findings: The lungs are well-aerated and clear.  There is no evidence of focal opacification, pleural effusion or pneumothorax. Mildly increased density at the lung bases is thought to  reflect overlying soft tissues.  The heart is normal in size; the patient is status post median sternotomy.  Calcification is noted at the aortic arch.  No acute osseous abnormalities are seen.  Bilateral breast implants are noted.  Clips are noted within the right upper quadrant, reflecting prior cholecystectomy.  IMPRESSION: No acute cardiopulmonary process seen.   Original Report Authenticated By: Tonia Ghent, M.D.       Dg Hip Complete Left  05/23/2012  *RADIOLOGY REPORT*  Clinical Data: Status post fall; left hip pain.  LEFT HIP - COMPLETE 2+ VIEW  Comparison: None.  Findings: There is no evidence of fracture or dislocation.  Both femoral heads are seated normally within their respective acetabula.  No significant degenerative change is appreciated. Mild sclerotic change is seen at the sacroiliac joints.  The visualized bowel gas pattern is grossly unremarkable in appearance.  Scattered phleboliths are noted within the pelvis. Postoperative change is noted at both inguinal regions.  IMPRESSION: No evidence of fracture or dislocation.   Original Report Authenticated By: Tonia Ghent, M.D.    Dg Hip Complete Right  05/23/2012  *RADIOLOGY REPORT*  Clinical Data: Status post fall; right hip pain.  RIGHT HIP - COMPLETE 2+ VIEW  Comparison: Right hip radiographs performed 05/17/2012  Findings: There is no evidence of fracture or dislocation.  Both femoral heads are seated normally within their respective acetabula.  The proximal right femur appears intact.  No significant degenerative change is appreciated.  Mild sclerotic change is seen at the sacroiliac joints.  The visualized bowel gas pattern is grossly unremarkable in appearance.  Scattered phleboliths are noted within the pelvis. Postoperative change is noted at both inguinal regions.  IMPRESSION: No evidence of fracture or dislocation.   Original Report Authenticated By: Tonia Ghent, M.D.       Ct Head Wo Contrast  05/23/2012  *RADIOLOGY REPORT*   Clinical Data: Status post fall; weakness.  CT HEAD WITHOUT CONTRAST  Technique:  Contiguous axial images were obtained from the base of the skull through the vertex without contrast.  Comparison: MRI/MRA of the brain performed 05/14/2006  Findings: There is no evidence of acute infarction, mass lesion, or intra- or extra-axial hemorrhage on CT.  The posterior fossa, including the cerebellum, brainstem and fourth ventricle, is within normal limits.  Mildly prominent focal nodularity at the anterior right cerebellar hemisphere is stable from the prior MRI and within normal limits.  The third and lateral ventricles, and basal ganglia are unremarkable in appearance.  The cerebral hemispheres are symmetric in appearance, with normal gray- white differentiation.  No mass effect or midline shift is seen.  There is  no evidence of fracture; visualized osseous structures are unremarkable in appearance.  The visualized portions of the orbits are within normal limits.  The paranasal sinuses and mastoid air cells are well-aerated.  No significant soft tissue abnormalities are seen.  Cerumen is noted within the external auditory canals bilaterally.  IMPRESSION:  1.  No evidence of traumatic intracranial injury or fracture. 2.  Cerumen noted within the external auditory canals bilaterally.   Original Report Authenticated By: Tonia Ghent, M.D.    Ct Abdomen Pelvis W Contrast  05/17/2012  *RADIOLOGY REPORT*  Clinical Data: 79 year old female with fall and right abdominal pelvic pain.  CT ABDOMEN AND PELVIS WITH CONTRAST  Technique:  Multidetector CT imaging of the abdomen and pelvis was performed following the standard protocol during bolus administration of intravenous contrast.  Contrast: 80mL OMNIPAQUE IOHEXOL 300 MG/ML  SOLN  Comparison: 01/07/2009 CT  Findings: The liver, spleen, pancreas, adrenal glands, and kidneys are unremarkable except for bilateral renal cortical thinning. The patient is status post hysterectomy  and cholecystectomy. No free fluid, enlarged lymph nodes, biliary dilation or abdominal aortic aneurysm identified.  Diffuse colonic diverticulosis noted without evidence of diverticulitis. No other bowel abnormalities are identified. The bladder is unremarkable.  No acute or suspicious bony abnormalities are identified.  IMPRESSION: No evidence of acute abnormality.  Diffuse colonic diverticulosis without diverticulitis.   Original Report Authenticated By: Harmon Pier, M.D.     My personal review of EKG: Rhythm NSR, inferior lateral t inversions which was present previously too    Assessment & Plan   1. UTI causing generalized weakness and falls. Patient will be admitted to the hospital on telemetry, empiric IV antibiotic, follow urine culture, since she has some flank tenderness we'll check renal ultrasound to rule out pyelonephritis/ureteric obstruction. PT to evaluate for balance and placement purposes. Gentle IV fluids.    2. History of CAD, dyslipidemia, hypertension. No acute issues home medications will be continued which include ARB, statin, iudur, hydralazine and Lopressor. Her EKG changes are old however will cycle troponin and monitor on telemetry.   Addendum - Troponin 2nd set 1.5, , pt is pain free likely due to infection, ASA added to plavix, Echo. Follow trop trend, no heparin for now. Case D/W her primary cardiologist Dr Donnie Aho, he will see her shortly agrees with the plan.    3. Diabetes mellitus type 2 no acute issues check A1c, continue home oral medications and sliding scale insulin.  CBG (last 3)   Basename 05/23/12 0743  GLUCAP 80     No results found for this basename: HGBA1C     4. Chronic kidney disease stage III. Creatinine appears to be 1.3 and it appears to be her baseline.      DVT Prophylaxis Heparin    AM Labs Ordered, also please review Full Orders  Family Communication: Admission, patients condition and plan of care including tests being  ordered have been discussed with the patient who indicates understanding and agree with the plan and Code Status.  Code Status full  Disposition Plan: Home  Time spent in minutes : 35  Condition fair  Leroy Sea M.D on 05/23/2012 at 10:46 AM  Between 7am to 7pm - Pager - [phone]  After 7pm go to [email or web address] - password [email or web address]  And look for the night coverage person covering me after hours  [facility] Office  [phone]

## 2012-05-23 NOTE — Progress Notes (Signed)
Patient had a temperature of 102, with chills and sweating. MD notified and new orders given. Will continue to monitor patient for further changes in condition.

## 2012-05-23 NOTE — Progress Notes (Signed)
CRITICAL VALUE ALERT  Critical value received: Troponin I   Date of notification: 05-24-11  Time of notification:  1240  Critical value read back:yes  Nurse who received alert:  Lincoln Brigham MD notified (1st page):    Time of first page:  1249  MD notified (2nd page):  Time of second page:  Responding MD:  singh  Time MD responded:  1300

## 2012-05-23 NOTE — ED Notes (Signed)
Per EMS: Pt c/o general weakness and nausea for three days. Had an assisted "fall" from commode (by neighbor). Complained of weakness and called 911. Later reported left hip pain. On arrival to ED reports "my heart hurts."

## 2012-05-23 NOTE — ED Notes (Signed)
Pt return from CT in NAD, IV site unremarkable, SR up x2.

## 2012-05-23 NOTE — ED Notes (Signed)
Pt report given to Shanda Bumps, RN on Unit 47[address]. [facility] here for pt transport. Pt in NAD upon transfer, IV site unremarkable.

## 2012-05-23 NOTE — Progress Notes (Signed)
Patient arrived to 79.  Patient oriented to the unit and put on the telemetry monitor.  The call bell was placed within reach and the bed alarm is on.

## 2012-05-23 NOTE — Progress Notes (Signed)
CRITICAL VALUE ALERT  Critical value received: CBG=68  Date of notification:  05-23-12  Time of notification:  1628  Critical value read back: yes  Nurse who received alert:  Junie Panning RN  MD notified (1st page):  Thedore Mins  Time of first page: 1745  MD notified (2nd page):  Time of second page:  Responding MD: Thedore Mins  Time MD responded:  [id number]

## 2012-05-23 NOTE — ED Notes (Signed)
Patient transported to CT 

## 2012-05-23 NOTE — Evaluation (Signed)
Physical Therapy Evaluation Patient Details Name: [name] MRN: [id number] DOB: 09-24-1926 Today's Date: 05/23/2012 Time: 8469-6295 PT Time Calculation (min): 26 min  PT Assessment / Plan / Recommendation Clinical Impression  Ms. Banos is 79 y/o female admitted for recurrent falls and weakness in the setting of UTI. Presents with below impairments limiting her safety and independence with mobility. Will benefit physical therapy in the acute setting to maximize functional indepence and safety for d/c home. Her impaired cognition, decreased safety awareness and balance deficits are concerning for more falls especially since she lives alone. She seems to think a neighbor could come stay with her for a week?     PT Assessment  Patient needs continued PT services    Follow Up Recommendations  Home health PT;Supervision/Assistance - 24 hour    Does the patient have the potential to tolerate intense rehabilitation      Barriers to Discharge Decreased caregiver support She seems to think her neighbor could come stay with her for a while? She also has life alert but says she's never used it    Equipment Recommendations  None recommended by PT    Recommendations for Other Services OT consult   Frequency Min 3X/week    Precautions / Restrictions Precautions Precautions: Fall Precaution Comments: recent falls prior to admission Restrictions Weight Bearing Restrictions: No   Pertinent Vitals/Pain Increased DOE 2/4 with activity      Mobility  Bed Mobility Bed Mobility: Supine to Sit;Sit to Supine Supine to Sit: 5: Supervision Sit to Supine: 5: Supervision Details for Bed Mobility Assistance: supervision for safety Transfers Transfers: Sit to Stand;Stand to Sit Sit to Stand: 4: Min assist;From bed;With upper extremity assist Stand to Sit: 4: Min guard;To bed;With upper extremity assist Details for Transfer Assistance: minA for stability for end stages of sit->stand, pt  reaching out for therapists support; gaurding for safety to sit Ambulation/Gait Ambulation/Gait Assistance: 4: Min assist;4: Min guard Ambulation Distance (Feet): 400 Feet Assistive device: None Ambulation/Gait Assistance Details: staggered gait with decreased awareness, minA for stability Stairs: No              PT Diagnosis: Difficulty walking;Abnormality of gait;Generalized weakness;Altered mental status  PT Problem List: Decreased strength;Decreased activity tolerance;Decreased balance;Decreased mobility;Decreased cognition;Decreased safety awareness PT Treatment Interventions: DME instruction;Gait training;Stair training;Functional mobility training;Therapeutic activities;Therapeutic exercise;Cognitive remediation;Patient/family education;Balance training;Neuromuscular re-education   PT Goals Acute Rehab PT Goals PT Goal Formulation: With patient Time For Goal Achievement: 05/30/12 Potential to Achieve Goals: Good Pt will go Sit to Stand: Independently PT Goal: Sit to Stand - Progress: Goal set today Pt will go Stand to Sit: Independently PT Goal: Stand to Sit - Progress: Goal set today Pt will Transfer Bed to Chair/Chair to Bed: Independently PT Transfer Goal: Bed to Chair/Chair to Bed - Progress: Goal set today Pt will Ambulate: >150 feet;with modified independence;with least restrictive assistive device PT Goal: Ambulate - Progress: Goal set today Pt will Perform Home Exercise Program: Independently PT Goal: Perform Home Exercise Program - Progress: Goal set today  Visit Information  Last PT Received On: 05/23/12 Assistance Needed: +1    Subjective Data  Subjective: I only fell when my dog knocked me down.  Patient Stated Goal: home   Prior Functioning  Home Living Lives With: Alone Available Help at Discharge: Neighbor;Friend(s);Available PRN/intermittently Type of Home: House Home Access: Stairs to enter [employer] of Steps: 2 Entrance  Stairs-Rails: None Home Layout: One level Home Adaptive Equipment: None Prior Function Level of Independence:  Independent Able to Take Stairs?: Yes Driving: Yes Vocation: Retired Musician: No difficulties    Cognition  Overall Cognitive Status: Impaired Area of Impairment: Memory;Attention;Safety/judgement;Awareness of errors Arousal/Alertness: Awake/alert Orientation Level: Disoriented to;Situation Behavior During Session: [facility] for tasks performed Current Attention Level: Selective;Sustained Memory Deficits: Pt denies any other falls than her dog knocking her down (chart says otherwise), doesn't remember what brought her here or when she came in but denies there is anything wrong with her Safety/Judgement: Decreased awareness of need for assistance Awareness of Errors: Assistance required to identify errors made    Extremity/Trunk Assessment Right Upper Extremity Assessment RUE ROM/Strength/Tone: [facility] for tasks assessed Left Upper Extremity Assessment LUE ROM/Strength/Tone: WFL for tasks assessed Right Lower Extremity Assessment RLE ROM/Strength/Tone: [facility] for tasks assessed Left Lower Extremity Assessment LLE ROM/Strength/Tone: [facility] for tasks assessed Trunk Assessment Trunk Assessment: Normal   Balance Balance Balance Assessed: Yes Static Standing Balance Static Standing - Balance Support: Right upper extremity supported Static Standing - Level of Assistance: 5: Stand by assistance Static Standing - Comment/# of Minutes: stood with one hand on the therapist to steady herself, can stand without any upper extremity support but increased sway noted High Level Balance High Level Balance Activites: Backward walking;Direction changes;Turns;Sudden stops;Head turns High Level Balance Comments: staggered gait with increased sway especially with higher challenging activites; with head turns pt slows down with decreased awareness of stagger, increased time needed for  pivot turn; decreased attention to safety in the hallway pt abruptly turning to speak to nursing staff with decreased postural control  End of Session PT - End of Session Equipment Utilized During Treatment: Gait belt Activity Tolerance: Patient limited by fatigue Patient left: in bed;with call bell/phone within reach Nurse Communication: Mobility status  GP     [facility] HELEN 05/23/2012, 1:08 PM

## 2012-05-23 NOTE — Progress Notes (Signed)
Patient's CBG 67.  Gave orange juice and crackers.  Rechecked CBG in 15 minutes.  CBG 77.  Gave patient another orange juice.  [facility] notified.  New orders to give milk and crackers with peanut butter given.  RN carried out orders.  Will continue to monitor the patient.

## 2012-05-23 NOTE — ED Provider Notes (Addendum)
History     CSN: [id number]  Arrival date & time 05/23/12  0131   First MD Initiated Contact with Patient 05/23/12 0204      Chief Complaint  Patient presents with  . Hip Pain  . Fall  . Chest Pain  . Weakness  . Fever    (Consider location/radiation/quality/duration/timing/severity/associated sxs/prior treatment) Patient is a 79 y.o. female presenting with hip pain, fall, and fever. The history is provided by a relative and the EMS personnel. The history is limited by the condition of the patient. No language interpreter was used.  Hip Pain This is a new problem. Episode onset: unclear as patient reported left hip pain to EMS followed being assisted to ground at home but then points to the right hip.  This was evaluated in the ED 12/27 following fall. The problem occurs constantly. The problem has not changed since onset.Associated symptoms comments: Reported to EMS that her heart hurt but denies chest pain to EDP. Nothing aggravates the symptoms. Nothing relieves the symptoms. She has tried nothing for the symptoms. The treatment provided no relief.  Fall The accident occurred 1 to 2 hours ago. Incident: assisted to the ground from toilet also seen for fall 12/26. She landed on a hard floor. There was no blood loss. There was no entrapment after the fall. There was no alcohol use involved in the accident. Associated symptoms include a fever. Prehospitalization: none. She has tried nothing for the symptoms. The treatment provided no relief.  Fever Primary symptoms of the febrile illness include fever. Episode onset: unknown.  Risk factors for febrile illness include diabetes mellitus.Primary symptoms comment: when asked if she has urinary symptoms she states it hurts but cannot recall when it started    Past Medical History  Diagnosis Date  . Hypertension   . Diabetes mellitus without complication   . Dementia   . MI (myocardial infarction)     Past Surgical History  Procedure  Date  . Cardiac surgery   . Cabg x4   . Tonsillectomy   . Cholecystectomy     No family history on file.  History  Substance Use Topics  . Smoking status: Never Smoker   . Smokeless tobacco: Not on file  . Alcohol Use: No    OB History    Grav Para Term Preterm Abortions TAB SAB Ect Mult Living                  Review of Systems  Unable to perform ROS Constitutional: Positive for fever.    Allergies  Clindamycin/lincomycin  Home Medications   Current Outpatient Rx  Name  Route  Sig  Dispense  Refill  . CLOPIDOGREL BISULFATE 75 MG PO TABS   Oral   Take 75 mg by mouth daily.         . DONEPEZIL HCL 10 MG PO TABS   Oral   Take 10 mg by mouth at bedtime as needed. For memory loss         . EZETIMIBE 10 MG PO TABS   Oral   Take 10 mg by mouth daily.         . FUROSEMIDE 20 MG PO TABS   Oral   Take 20 mg by mouth 2 (two) times daily.         Marland Kitchen GABAPENTIN 300 MG PO CAPS   Oral   Take 300 mg by mouth 2 (two) times daily.         Marland Kitchen  GLIMEPIRIDE 2 MG PO TABS   Oral   Take 1 mg by mouth daily before breakfast.         . HYDRALAZINE HCL 50 MG PO TABS   Oral   Take 50 mg by mouth 2 (two) times daily.         . ISOSORBIDE MONONITRATE ER 30 MG PO TB24   Oral   Take 30 mg by mouth 2 (two) times daily.         Marland Kitchen LOSARTAN POTASSIUM 50 MG PO TABS   Oral   Take 50 mg by mouth 2 (two) times daily.          Marland Kitchen METOPROLOL TARTRATE 25 MG PO TABS   Oral   Take 25 mg by mouth 2 (two) times daily.         Marland Kitchen SIMVASTATIN 40 MG PO TABS   Oral   Take 40 mg by mouth every evening.         Marland Kitchen TRAMADOL HCL 50 MG PO TABS   Oral   Take 1 tablet (50 mg total) by mouth every 6 (six) hours as needed for pain.   15 tablet   0     BP 141/46  Pulse 86  Temp 98.5 F (36.9 C) (Oral)  Resp 15  SpO2 96%  Physical Exam  Constitutional: She appears well-developed and well-nourished. No distress.  HENT:  Head: Normocephalic and atraumatic. Head is  without raccoon's eyes and without Battle's sign.  Right Ear: No mastoid tenderness.  Left Ear: No mastoid tenderness.  Mouth/Throat: Oropharynx is clear and moist.  Eyes: Conjunctivae normal and EOM are normal. Pupils are equal, round, and reactive to light.  Neck: Normal range of motion. Neck supple. No JVD present.  Cardiovascular: Normal rate, regular rhythm and intact distal pulses.   Pulmonary/Chest: Effort normal and breath sounds normal. No respiratory distress. She has no wheezes. She has no rales.  Abdominal: Soft. Bowel sounds are normal. There is no tenderness. There is no rebound and no guarding.  Musculoskeletal: Normal range of motion. She exhibits no edema.  Lymphadenopathy:    She has no cervical adenopathy.  Neurological: She is alert. She has normal reflexes.  Skin: Skin is warm and dry.  Psychiatric: She has a normal mood and affect.    ED Course  Procedures (including critical care time)  Labs Reviewed  CBC - Abnormal; Notable for the following:    RBC 3.44 (*)     Hemoglobin 10.3 (*)     HCT 30.7 (*)     All other components within normal limits  BASIC METABOLIC PANEL - Abnormal; Notable for the following:    Glucose, Bld 61 (*)     BUN 24 (*)     Creatinine, Ser 1.30 (*)     GFR calc non Af Amer 37 (*)     GFR calc Af Amer 43 (*)     All other components within normal limits  URINALYSIS, ROUTINE W REFLEX MICROSCOPIC - Abnormal; Notable for the following:    Color, Urine AMBER (*)  BIOCHEMICALS MAY BE AFFECTED BY COLOR   APPearance TURBID (*)     Hgb urine dipstick SMALL (*)     Ketones, ur 15 (*)     Protein, ur 100 (*)     Nitrite POSITIVE (*)     Leukocytes, UA LARGE (*)     All other components within normal limits  URINE MICROSCOPIC-ADD ON - Abnormal; Notable for the  following:    Bacteria, UA MANY (*)     All other components within normal limits  TROPONIN I  OCCULT BLOOD X 1 CARD TO LAB, STOOL  URINE CULTURE   Dg Chest 2 View  05/23/2012   *RADIOLOGY REPORT*  Clinical Data: Status post fall; chest pain.  Weakness and fever.  CHEST - 2 VIEW  Comparison: Chest radiograph performed 05/16/2012  Findings: The lungs are well-aerated and clear.  There is no evidence of focal opacification, pleural effusion or pneumothorax. Mildly increased density at the lung bases is thought to reflect overlying soft tissues.  The heart is normal in size; the patient is status post median sternotomy.  Calcification is noted at the aortic arch.  No acute osseous abnormalities are seen.  Bilateral breast implants are noted.  Clips are noted within the right upper quadrant, reflecting prior cholecystectomy.  IMPRESSION: No acute cardiopulmonary process seen.   Original Report Authenticated By: Tonia Ghent, M.D.    Dg Hip Complete Left  05/23/2012  *RADIOLOGY REPORT*  Clinical Data: Status post fall; left hip pain.  LEFT HIP - COMPLETE 2+ VIEW  Comparison: None.  Findings: There is no evidence of fracture or dislocation.  Both femoral heads are seated normally within their respective acetabula.  No significant degenerative change is appreciated. Mild sclerotic change is seen at the sacroiliac joints.  The visualized bowel gas pattern is grossly unremarkable in appearance.  Scattered phleboliths are noted within the pelvis. Postoperative change is noted at both inguinal regions.  IMPRESSION: No evidence of fracture or dislocation.   Original Report Authenticated By: Tonia Ghent, M.D.    Dg Hip Complete Right  05/23/2012  *RADIOLOGY REPORT*  Clinical Data: Status post fall; right hip pain.  RIGHT HIP - COMPLETE 2+ VIEW  Comparison: Right hip radiographs performed 05/17/2012  Findings: There is no evidence of fracture or dislocation.  Both femoral heads are seated normally within their respective acetabula.  The proximal right femur appears intact.  No significant degenerative change is appreciated.  Mild sclerotic change is seen at the sacroiliac joints.  The visualized  bowel gas pattern is grossly unremarkable in appearance.  Scattered phleboliths are noted within the pelvis. Postoperative change is noted at both inguinal regions.  IMPRESSION: No evidence of fracture or dislocation.   Original Report Authenticated By: Tonia Ghent, M.D.      No diagnosis found.    MDM   Date: 05/23/2012  Rate: 94  Rhythm: normal sinus rhythm  QRS Axis: normal  Intervals: normal  ST/T Wave abnormalities: ST depressions inferiorly and ST depressions laterally  Conduction Disutrbances:none  Narrative Interpretation:   Old EKG Reviewed: unchanged          Adalae Baysinger K Brooke Payes-Rasch, MD 05/23/12 0424  Chauntelle Azpeitia K Tayte Childers-Rasch, MD 05/23/12 1610

## 2012-05-24 ENCOUNTER — Inpatient Hospital Stay (HOSPITAL_COMMUNITY): Payer: Medicare Other

## 2012-05-24 DIAGNOSIS — I5032 Chronic diastolic (congestive) heart failure: Secondary | ICD-10-CM

## 2012-05-24 DIAGNOSIS — I2510 Atherosclerotic heart disease of native coronary artery without angina pectoris: Secondary | ICD-10-CM

## 2012-05-24 DIAGNOSIS — I359 Nonrheumatic aortic valve disorder, unspecified: Secondary | ICD-10-CM

## 2012-05-24 LAB — GLUCOSE, CAPILLARY
Glucose-Capillary: 101 mg/dL — ABNORMAL HIGH (ref 70–99)
Glucose-Capillary: 143 mg/dL — ABNORMAL HIGH (ref 70–99)

## 2012-05-24 LAB — BASIC METABOLIC PANEL
BUN: 20 mg/dL (ref 6–23)
Chloride: 103 mEq/L (ref 96–112)
GFR calc non Af Amer: 54 mL/min — ABNORMAL LOW (ref >90)
Glucose, Bld: 126 mg/dL — ABNORMAL HIGH (ref 70–99)
Potassium: 4.2 mEq/L (ref 3.5–5.1)

## 2012-05-24 LAB — CBC
HCT: 27.6 % — ABNORMAL LOW (ref 36.0–46.0)
Hemoglobin: 9 g/dL — ABNORMAL LOW (ref 12.0–15.0)
MCH: 28.4 pg (ref 26.0–34.0)
MCHC: 32.6 g/dL (ref 30.0–36.0)

## 2012-05-24 LAB — URINE CULTURE

## 2012-05-24 MED ORDER — ONDANSETRON HCL 4 MG/2ML IJ SOLN
4.0000 mg | INTRAMUSCULAR | Status: DC | PRN
  Administered 2012-05-24: 4 mg via INTRAVENOUS
  Filled 2012-05-24: qty 2

## 2012-05-24 MED ORDER — DEXTROSE-NACL 5-0.45 % IV SOLN
INTRAVENOUS | Status: DC
  Administered 2012-05-24: 10:00:00 via INTRAVENOUS

## 2012-05-24 MED ORDER — METOPROLOL TARTRATE 50 MG PO TABS
50.0000 mg | ORAL_TABLET | Freq: Two times a day (BID) | ORAL | Status: DC
  Administered 2012-05-24 – 2012-05-25 (×2): 50 mg via ORAL
  Filled 2012-05-24 (×3): qty 1

## 2012-05-24 MED ORDER — METOPROLOL TARTRATE 25 MG PO TABS
25.0000 mg | ORAL_TABLET | Freq: Once | ORAL | Status: AC
  Administered 2012-05-24: 25 mg via ORAL
  Filled 2012-05-24: qty 1

## 2012-05-24 MED ORDER — INSULIN ASPART 100 UNIT/ML ~~LOC~~ SOLN: 0.0000 [IU] | Freq: Three times a day (TID) | SUBCUTANEOUS | Status: DC

## 2012-05-24 NOTE — Progress Notes (Signed)
PT Cancellation Note  Patient Details Name: [name] MRN: [id number] DOB: [birth year]   Cancelled Treatment:    Reason Eval/Treat Not Completed: Patient at procedure or test/unavailable.   WHITLOW,Mikenzi Raysor HELEN 05/24/2012, 9:08 AM

## 2012-05-24 NOTE — Progress Notes (Signed)
Physical Therapy Treatment Patient Details Name: [name] MRN: [id number] DOB: 06/10/1926 Today's Date: 05/24/2012 Time: 5784-6962 PT Time Calculation (min): 25 min  PT Assessment / Plan / Recommendation Comments on Treatment Session  Admitted for UTI. Continues to demonstrate poor safety awarenss today with impaired cognition in addition she appears weaker today with decreased independence in comparison to evaluation yesterday. Is quite shaky and tremulous today needing assist to hold onto a cup. She needs SNF, doubt she has 24 hour assist at home.     Follow Up Recommendations  SNF     Does the patient have the potential to tolerate intense rehabilitation     Barriers to Discharge        Equipment Recommendations  Rolling walker with 5" wheels    Recommendations for Other Services    Frequency Min 3X/week   Plan Discharge plan needs to be updated;Frequency remains appropriate    Precautions / Restrictions Precautions Precautions: Fall Restrictions Weight Bearing Restrictions: No   Pertinent Vitals/Pain Complaining of right shoulder pain this afternoon    Mobility  Bed Mobility Supine to Sit: 3: Mod assist Sit to Supine: 3: Mod assist Details for Bed Mobility Assistance: sequencing cues with faciliatory cues for follow through Transfers Transfers: Sit to Stand;Stand to Sit;Stand Pivot Transfers Sit to Stand: 4: Min assist;3: Mod assist;With upper extremity assist;From bed;From chair/3-in-1 Stand to Sit: 4: Min assist;With upper extremity assist;To chair/3-in-1;To bed Stand Pivot Transfers: 3: Mod assist Details for Transfer Assistance: pt grabbing therapist for support with tight grip needing cues to use RW and armrests for stability, sequencing cues as well as tactile stability assist for safe technique; mod sequencing cues and guidance for safety during transfer to 3in1 Ambulation/Gait Ambulation/Gait Assistance: 4: Min assist Ambulation Distance (Feet): 200  Feet Assistive device: Rolling walker Ambulation/Gait Assistance Details: cues for attention to task, min tactile assist for stabiilty especially as she gets distracted (looking into patients rooms, talking to people passing by); verbal sequencing cues Gait Pattern: Shuffle Gait velocity: slow     PT Goals Acute Rehab PT Goals PT Goal: Sit to Stand - Progress: Progressing toward goal PT Goal: Stand to Sit - Progress: Progressing toward goal PT Transfer Goal: Bed to Chair/Chair to Bed - Progress: Progressing toward goal PT Goal: Ambulate - Progress: Progressing toward goal  Visit Information  Last PT Received On: 05/24/12 Assistance Needed: +1    Subjective Data  Subjective: You are just a doll.    Cognition  Overall Cognitive Status: Impaired Area of Impairment: Attention;Memory;Safety/judgement;Awareness of errors Arousal/Alertness: Awake/alert Orientation Level: Disoriented to;Situation Behavior During Session: [facility] for tasks performed Current Attention Level: Selective;Sustained Safety/Judgement: Decreased awareness of safety precautions;Decreased safety judgement for tasks assessed Awareness of Errors: Assistance required to identify errors made;Assistance required to correct errors made Cognition - Other Comments: highly distractable     Balance     End of Session PT - End of Session Equipment Utilized During Treatment: Gait belt Activity Tolerance: Patient tolerated treatment well Patient left: in bed;with call bell/phone within reach;with bed alarm set Nurse Communication: Mobility status;Other (comment) (change in d/c recommendations)   GP     [facility] HELEN 05/24/2012, 4:19 PM

## 2012-05-24 NOTE — Progress Notes (Signed)
[facility]                                                                                Patient Demographics  [name], is a 79 y.o. female  JYN:[id number]  QMV:[id number]  DOB - [birth year]  Admit date - 05/23/2012  Admitting Physician Fran Lowes, DO  Outpatient Primary MD for the patient is SHAW,KIMBERLEE, MD  LOS - 1   Chief Complaint  Patient presents with  . Hip Pain  . Fall  . Chest Pain  . Weakness  . Fever        Assessment & Plan    1. UTI causing generalized weakness and falls. Urine growing Escherichia coli, will monitor sensitivity results, blood cultures pending, fever and clinically much improved on empiric IV Rocephin which will be continued, renal ultrasound stable.    2. History of CAD, dyslipidemia, hypertension. - No active chest pain, had non-ACS pattern troponin rise to 2 underlying infection and stress, EKG with chronic T wave inversions and ST depression, echo pending, stable on telemetry, appreciate cardiology input by Dr. Viann Fish, Continue on ARB, statin, imdur, hydralazine and increased Lopressor dose for better blood pressure control. She is on Plavix have added low-dose aspirin for now. Probable discharge on Plavix only.    3. Diabetes mellitus type 2 no acute issues , new to decreased by mouth intake sugars were running low, held her oral Amaryl, Zetia along with sliding scale insulin to continue once sugars stabilize.  Lab Results  Component Value Date   HGBA1C 5.9* 05/23/2012    CBG (last 3)   Basename 05/24/12 1032 05/24/12 0538 05/24/12 0242  GLUCAP 131* 127* 92      4. Chronic kidney disease stage III. Creatinine appears to be 1.3 and it appears to be her baseline.      5. History of chronic diastolic CHF. Currently compensated, present echo pending, will stop IV fluids as by mouth intake is improving and dehydration seems to have improved.     Code Status: Full  Family Communication:  Discussed with the patient  Disposition Plan: Home with home PT   Procedures echo gram, renal ultrasound   Consults  cardiology Dr. Donnie Aho   DVT Prophylaxis heparin  Lab Results  Component Value Date   PLT 136* 05/24/2012    Medications  Scheduled Meds:   . aspirin  81 mg Oral Daily  . cefTRIAXone (ROCEPHIN)  IV  1 g Intravenous Q24H  . clopidogrel  75 mg Oral Q breakfast  . donepezil  10 mg Oral QHS  . ezetimibe  10 mg Oral Daily  . heparin  5,000 Units Subcutaneous Q8H  . hydrALAZINE  50 mg Oral BID  . isosorbide mononitrate  30 mg Oral BID  . losartan  50 mg Oral BID  . metoprolol tartrate  50 mg Oral BID  . metoprolol tartrate  25 mg Oral Once  . simvastatin  40 mg Oral q1800  . sodium chloride  3 mL Intravenous Q12H   Continuous Infusions:  PRN Meds:.acetaminophen, albuterol, guaiFENesin-dextromethorphan, HYDROcodone-acetaminophen, ondansetron (ZOFRAN) IV, senna-docusate, traMADol  Antibiotics    Anti-infectives     Start  Dose/Rate Route Frequency Ordered Stop   05/23/12 0900   cefTRIAXone (ROCEPHIN) 1 g in dextrose 5 % 50 mL IVPB        1 g 100 mL/hr over 30 Minutes Intravenous Every 24 hours 05/23/12 0721     05/23/12 0345   cefTRIAXone (ROCEPHIN) 1 g in dextrose 5 % 50 mL IVPB        1 g 100 mL/hr over 30 Minutes Intravenous  Once 05/23/12 0332 05/23/12 0438           Time Spent in minutes   35   [name] K M.D on 05/24/2012 at 10:37 AM  Between 7am to 7pm - Pager - [phone]  After 7pm go to [email or web address] - password [email or web address]  And look for the night coverage person covering for me after hours  [facility] Office  [phone]    Subjective:   [name] today has, No headache, No chest pain, No abdominal pain - No Nausea, No new weakness tingling or numbness, No Cough - SOB.   Objective:   Filed Vitals:   05/24/12 0254 05/24/12 0348 05/24/12 0936 05/24/12 0959  BP: 135/73 133/54 169/66   Pulse: 52 59  70    Temp: 97.6 F (36.4 C) 97 F (36.1 C)    TempSrc: Oral Oral    Resp: 16 18    Height:      Weight:  60.782 kg (134 lb)    SpO2: 99% 100%      Wt Readings from Last 3 Encounters:  05/24/12 60.782 kg (134 lb)     Intake/Output Summary (Last 24 hours) at 05/24/12 1037 Last data filed at 05/24/12 0955  Gross per 24 hour  Intake   1188 ml  Output    800 ml  Net    388 ml    Exam Awake Alert, Oriented X 3, No new F.N deficits, Normal affect [name].AT,PERRAL Supple Neck,No JVD, No cervical lymphadenopathy appriciated.  Symmetrical Chest wall movement, Good air movement bilaterally, CTAB RRR,No Gallops,Rubs or new Murmurs, No Parasternal Heave +ve B.Sounds, Abd Soft, Non tender, No organomegaly appriciated, No rebound - guarding or rigidity. No Cyanosis, Clubbing or edema, No new Rash or bruise    Data Review   Micro Results Recent Results (from the past 240 hour(s))  URINE CULTURE     Status: Normal   Collection Time   05/17/12  1:30 AM      Component Value Range Status Comment   Specimen Description URINE, CATHETERIZED   Final    Special Requests NONE   Final    Culture  Setup Time 05/17/2012 08:34   Final    Colony Count NO GROWTH   Final    Culture NO GROWTH   Final    Report Status 05/18/2012 FINAL   Final   URINE CULTURE     Status: Normal (Preliminary result)   Collection Time   05/23/12  2:57 AM      Component Value Range Status Comment   Specimen Description URINE, CATHETERIZED   Final    Special Requests NONE   Final    Culture  Setup Time 05/23/2012 03:55   Final    Colony Count >=100,000 COLONIES/ML   Final    Culture ESCHERICHIA COLI   Final    Report Status PENDING   Incomplete   CULTURE, BLOOD (ROUTINE X 2)     Status: Normal (Preliminary result)   Collection Time   05/23/12  4:00 PM  Component Value Range Status Comment   Specimen Description BLOOD HAND RIGHT   Final    Special Requests BOTTLES DRAWN AEROBIC ONLY 8CC   Final    Culture  Setup Time  05/23/2012 21:40   Final    Culture     Final    Value:        BLOOD CULTURE RECEIVED NO GROWTH TO DATE CULTURE WILL BE HELD FOR 5 DAYS BEFORE ISSUING A FINAL NEGATIVE REPORT   Report Status PENDING   Incomplete   CULTURE, BLOOD (ROUTINE X 2)     Status: Normal (Preliminary result)   Collection Time   05/23/12  4:15 PM      Component Value Range Status Comment   Specimen Description BLOOD HAND RIGHT   Final    Special Requests BOTTLES DRAWN AEROBIC ONLY 8CC   Final    Culture  Setup Time 05/23/2012 21:40   Final    Culture     Final    Value:        BLOOD CULTURE RECEIVED NO GROWTH TO DATE CULTURE WILL BE HELD FOR 5 DAYS BEFORE ISSUING A FINAL NEGATIVE REPORT   Report Status PENDING   Incomplete     Radiology Reports Dg Chest 2 View  05/23/2012  *RADIOLOGY REPORT*  Clinical Data: Status post fall; chest pain.  Weakness and fever.  CHEST - 2 VIEW  Comparison: Chest radiograph performed 05/16/2012  Findings: The lungs are well-aerated and clear.  There is no evidence of focal opacification, pleural effusion or pneumothorax. Mildly increased density at the lung bases is thought to reflect overlying soft tissues.  The heart is normal in size; the patient is status post median sternotomy.  Calcification is noted at the aortic arch.  No acute osseous abnormalities are seen.  Bilateral breast implants are noted.  Clips are noted within the right upper quadrant, reflecting prior cholecystectomy.  IMPRESSION: No acute cardiopulmonary process seen.   Original Report Authenticated By: Tonia Ghent, M.D.    Dg Ribs Unilateral W/chest Right  05/16/2012  *RADIOLOGY REPORT*  Clinical Data: Status post fall several days ago; right posterior rib pain.  RIGHT RIBS AND CHEST - 3+ VIEW  Comparison: Chest radiograph performed 06/05/2011  Findings: No displaced rib fractures are seen.  The lungs are well-aerated and clear.  There is no evidence of focal opacification, pleural effusion or pneumothorax.  The left  costophrenic angle is incompletely imaged on this study.  The cardiomediastinal silhouette is normal in size; the patient is status post median sternotomy, with scattered postoperative change along the left side of mediastinum.  No acute osseous abnormalities are seen.  Bilateral breast implants are noted.  Clips are noted within the right upper quadrant, reflecting prior cholecystectomy.  IMPRESSION:  1.  No displaced rib fractures seen. 2.  No acute cardiopulmonary process identified.   Original Report Authenticated By: Tonia Ghent, M.D.    Dg Hip Complete Left  05/23/2012  *RADIOLOGY REPORT*  Clinical Data: Status post fall; left hip pain.  LEFT HIP - COMPLETE 2+ VIEW  Comparison: None.  Findings: There is no evidence of fracture or dislocation.  Both femoral heads are seated normally within their respective acetabula.  No significant degenerative change is appreciated. Mild sclerotic change is seen at the sacroiliac joints.  The visualized bowel gas pattern is grossly unremarkable in appearance.  Scattered phleboliths are noted within the pelvis. Postoperative change is noted at both inguinal regions.  IMPRESSION: No evidence of fracture or dislocation.  Original Report Authenticated By: Tonia Ghent, M.D.    Dg Hip Complete Right  05/23/2012  *RADIOLOGY REPORT*  Clinical Data: Status post fall; right hip pain.  RIGHT HIP - COMPLETE 2+ VIEW  Comparison: Right hip radiographs performed 05/17/2012  Findings: There is no evidence of fracture or dislocation.  Both femoral heads are seated normally within their respective acetabula.  The proximal right femur appears intact.  No significant degenerative change is appreciated.  Mild sclerotic change is seen at the sacroiliac joints.  The visualized bowel gas pattern is grossly unremarkable in appearance.  Scattered phleboliths are noted within the pelvis. Postoperative change is noted at both inguinal regions.  IMPRESSION: No evidence of fracture or  dislocation.   Original Report Authenticated By: Tonia Ghent, M.D.    Dg Hip Complete Right  05/17/2012  *RADIOLOGY REPORT*  Clinical Data: Status post fall; persistent right hip pain.  RIGHT HIP - COMPLETE 2+ VIEW  Comparison: None.  Findings: There is no evidence of fracture or dislocation.  Both femoral heads are seated normally within their respective acetabula.  The proximal right femur appears intact.  Mild degenerative change is noted at the lower lumbar spine.  Mild sclerotic change is seen at the sacroiliac joints.  The visualized bowel gas pattern is grossly unremarkable in appearance.  Contrast is seen within the ureters and bladder. Postoperative change is noted at both inguinal regions.  IMPRESSION: No evidence of fracture or dislocation.   Original Report Authenticated By: Tonia Ghent, M.D.    Ct Head Wo Contrast  05/23/2012  *RADIOLOGY REPORT*  Clinical Data: Status post fall; weakness.  CT HEAD WITHOUT CONTRAST  Technique:  Contiguous axial images were obtained from the base of the skull through the vertex without contrast.  Comparison: MRI/MRA of the brain performed 05/14/2006  Findings: There is no evidence of acute infarction, mass lesion, or intra- or extra-axial hemorrhage on CT.  The posterior fossa, including the cerebellum, brainstem and fourth ventricle, is within normal limits.  Mildly prominent focal nodularity at the anterior right cerebellar hemisphere is stable from the prior MRI and within normal limits.  The third and lateral ventricles, and basal ganglia are unremarkable in appearance.  The cerebral hemispheres are symmetric in appearance, with normal gray- white differentiation.  No mass effect or midline shift is seen.  There is no evidence of fracture; visualized osseous structures are unremarkable in appearance.  The visualized portions of the orbits are within normal limits.  The paranasal sinuses and mastoid air cells are well-aerated.  No significant soft tissue  abnormalities are seen.  Cerumen is noted within the external auditory canals bilaterally.  IMPRESSION:  1.  No evidence of traumatic intracranial injury or fracture. 2.  Cerumen noted within the external auditory canals bilaterally.   Original Report Authenticated By: Tonia Ghent, M.D.    Ct Abdomen Pelvis W Contrast  05/17/2012  *RADIOLOGY REPORT*  Clinical Data: 79 year old female with fall and right abdominal pelvic pain.  CT ABDOMEN AND PELVIS WITH CONTRAST  Technique:  Multidetector CT imaging of the abdomen and pelvis was performed following the standard protocol during bolus administration of intravenous contrast.  Contrast: 80mL OMNIPAQUE IOHEXOL 300 MG/ML  SOLN  Comparison: 01/07/2009 CT  Findings: The liver, spleen, pancreas, adrenal glands, and kidneys are unremarkable except for bilateral renal cortical thinning. The patient is status post hysterectomy and cholecystectomy. No free fluid, enlarged lymph nodes, biliary dilation or abdominal aortic aneurysm identified.  Diffuse colonic diverticulosis noted without evidence of diverticulitis.  No other bowel abnormalities are identified. The bladder is unremarkable.  No acute or suspicious bony abnormalities are identified.  IMPRESSION: No evidence of acute abnormality.  Diffuse colonic diverticulosis without diverticulitis.   Original Report Authenticated By: Harmon Pier, M.D.    US Renal  05/23/2012  *RADIOLOGY REPORT*  Clinical Data: Left flank pain  RENAL/URINARY TRACT ULTRASOUND COMPLETE  Comparison:  CT abdomen pelvis dated 05/17/2012  Findings:  Right Kidney:  Measures 9.4 cm.  No mass or hydronephrosis.  Left Kidney:  Measures 9.4 cm.  No mass or hydronephrosis.  Bladder:  Underdistended.  IMPRESSION: Negative renal ultrasound.   Original Report Authenticated By: Charline Bills, M.D.     CBC  Lab 05/24/12 0750 05/23/12 1300 05/23/12 0155  WBC 8.4 9.6 10.1  HGB 9.0* 9.9* 10.3*  HCT 27.6* 29.7* 30.7*  PLT 136* 162 174  MCV 87.1 87.9  89.2  MCH 28.4 29.3 29.9  MCHC 32.6 33.3 33.6  RDW 12.8 13.0 12.6  LYMPHSABS -- -- --  MONOABS -- -- --  EOSABS -- -- --  BASOSABS -- -- --  BANDABS -- -- --    Chemistries   Lab 05/24/12 0750 05/23/12 1000 05/23/12 0155  NA 135 -- 137  K 4.2 -- 3.6  CL 103 -- 102  CO2 20 -- 20  GLUCOSE 126* -- 61*  BUN 20 -- 24*  CREATININE 0.95 1.24* 1.30*  CALCIUM 8.5 -- 8.9  MG -- -- --  AST -- -- --  ALT -- -- --  ALKPHOS -- -- --  BILITOT -- -- --   ------------------------------------------------------------------------------------------------------------------ estimated creatinine clearance is 42 ml/min (by C-G formula based on Cr of 0.95). ------------------------------------------------------------------------------------------------------------------  Basename 05/23/12 1000  HGBA1C 5.9*   ------------------------------------------------------------------------------------------------------------------ No results found for this basename: CHOL:2,HDL:2,LDLCALC:2,TRIG:2,CHOLHDL:2,LDLDIRECT:2 in the last 72 hours ------------------------------------------------------------------------------------------------------------------  Basename 05/23/12 1000  TSH 0.903  T4TOTAL --  T3FREE --  THYROIDAB --   ------------------------------------------------------------------------------------------------------------------ No results found for this basename: VITAMINB12:2,FOLATE:2,FERRITIN:2,TIBC:2,IRON:2,RETICCTPCT:2 in the last 72 hours  Coagulation profile No results found for this basename: INR:5,PROTIME:5 in the last 168 hours  No results found for this basename: DDIMER:2 in the last 72 hours  Cardiac Enzymes  Lab 05/23/12 2218 05/23/12 1715 05/23/12 1137  CKMB -- -- --  TROPONINI 1.24* 1.51* 1.43*  MYOGLOBIN -- -- --   ------------------------------------------------------------------------------------------------------------------ No components found with this basename:  POCBNP:3

## 2012-05-24 NOTE — Progress Notes (Signed)
  Echocardiogram 2D Echocardiogram has been performed.  Franchesca Veneziano 05/24/2012, 10:00 AM

## 2012-05-24 NOTE — Progress Notes (Signed)
Subjective:  Still feels weak.  No c/o chest pain or SOB. Urine growing E. Coli.  Objective:  Vital Signs in the last 24 hours: BP 169/66  Pulse 70  Temp 97 F (36.1 C) (Oral)  Resp 18  Ht 5\' 6"  (1.676 m)  Wt 60.782 kg (134 lb)  BMI 21.63 kg/m2  SpO2 100%  Physical Exam: Elderly WF appears weak Lungs:  Clear Cardiac:  Regular rhythm, normal S1 and S2, no S3 2/6 systolic murumr Abdomen:  Soft, nontender, no masses Extremities:  No edema present  Intake/Output from previous day: 01/02 0701 - 01/03 0700 In: 1308 [P.O.:460; I.V.:798; IV Piggyback:50] Out: 800 [Urine:800]  Weight Filed Weights   05/23/12 0700 05/24/12 0348  Weight: 59.2 kg (130 lb 8.2 oz) 60.782 kg (134 lb)    Lab Results: Basic Metabolic Panel:  Basename 05/24/12 0750 05/23/12 1000 05/23/12 0155  NA 135 -- 137  K 4.2 -- 3.6  CL 103 -- 102  CO2 20 -- 20  GLUCOSE 126* -- 61*  BUN 20 -- 24*  CREATININE 0.95 1.24* --   CBC:  Basename 05/24/12 0750 05/23/12 1300  WBC 8.4 9.6  NEUTROABS -- --  HGB 9.0* 9.9*  HCT 27.6* 29.7*  MCV 87.1 87.9  PLT 136* 162   Cardiac Enzymes:  Basename 05/23/12 2218 05/23/12 1715 05/23/12 1137  CKTOTAL -- -- --  CKMB -- -- --  CKMBINDEX -- -- --  TROPONINI 1.24* 1.51* 1.43*    Telemetry: Sinus rhythm  Assessment/Plan:  1. Non STEMI due to demand ischemia secondary to sepsis, not primary issue 2. Hypertensive heart disease above goal 3. .Dementia 4. Anemia  Rec:  Will review ECHO.  I would reduce IV fluids with prior history of diastolic CHF in past.   W. Ashley Royalty  MD [facility]  05/24/2012, 1:40 PM

## 2012-05-24 NOTE — Progress Notes (Signed)
Observed critical troponin value of 1.24 in the results review.  Troponin level lower than the previous value.  MD made aware of elevated troponin levels during day shift.  RN will continue to monitor the patient.

## 2012-05-25 MED ORDER — METOPROLOL TARTRATE 50 MG PO TABS: 50.0000 mg | ORAL_TABLET | Freq: Two times a day (BID) | ORAL | Status: DC

## 2012-05-25 MED ORDER — CIPROFLOXACIN HCL 500 MG PO TABS: 500.0000 mg | ORAL_TABLET | Freq: Two times a day (BID) | ORAL | Status: DC

## 2012-05-25 NOTE — Progress Notes (Signed)
NCM spoke to son, Raiford Noble # [phone]. States pt is going to her home and is requesting [facility] for [facility]. States she will also need RW for home. NCM put [facility] contact info on d/c instruction. Contacted [facility] for RW to be delivered. Faxed orders and facesheet to [facility] for d/c today.  Isidoro Donning RN CCM Case Mgmt phone [phone]

## 2012-05-25 NOTE — Progress Notes (Signed)
Subjective:  Confused and paranoid but oriented. No SOB.  Objective:  Vital Signs in the last 24 hours: BP 149/65  Pulse 65  Temp 98 F (36.7 C) (Oral)  Resp 18  Ht 5\' 6"  (1.676 m)  Wt 60.2 kg (132 lb 11.5 oz)  BMI 21.42 kg/m2  SpO2 99%  Physical Exam: Elderly WF tearful, hallucinating Lungs:  Clear Cardiac:  Regular rhythm, normal S1 and S2, no S3 2/6 systolic murumr Abdomen:  Soft, nontender, no masses Extremities:  No edema present  Intake/Output from previous day: 01/03 0701 - 01/04 0700 In: 635 [P.O.:580; I.V.:3; IV Piggyback:52] Out: 725 [Urine:725]  Weight Filed Weights   05/23/12 0700 05/24/12 0348 05/25/12 0518  Weight: 59.2 kg (130 lb 8.2 oz) 60.782 kg (134 lb) 60.2 kg (132 lb 11.5 oz)    Lab Results: Basic Metabolic Panel:  Basename 05/24/12 0750 05/23/12 1000 05/23/12 0155  NA 135 -- 137  K 4.2 -- 3.6  CL 103 -- 102  CO2 20 -- 20  GLUCOSE 126* -- 61*  BUN 20 -- 24*  CREATININE 0.95 1.24* --   CBC:  Basename 05/24/12 0750 05/23/12 1300  WBC 8.4 9.6  NEUTROABS -- --  HGB 9.0* 9.9*  HCT 27.6* 29.7*  MCV 87.1 87.9  PLT 136* 162   Cardiac Enzymes:  Basename 05/23/12 2218 05/23/12 1715 05/23/12 1137  CKTOTAL -- -- --  CKMB -- -- --  CKMBINDEX -- -- --  TROPONINI 1.24* 1.51* 1.43*    Telemetry: Sinus rhythm  Assessment/Plan:  1. Non STEMI due to demand ischemia secondary to sepsis, not primary issue 2. Hypertensive heart disease above goal 3. .Dementia 4.  Anemia  Rec:  ECHO showed good LV function with LVH, mild aoritc valve thickening.  Dr. Thedore Mins is sending patient home today with 24 hour care for son. She needs supervision.  Darden Palmer  MD [facility]  05/25/2012, 10:28 AM

## 2012-05-25 NOTE — Discharge Summary (Signed)
[facility]                                                                                   [name], is a 79 y.o. female  DOB [birth year]  MRN [id number].  Admission date:  05/23/2012  Discharge Date:  05/25/2012  Primary MD  Lupita Raider, MD  Admitting Physician  Ava Swayze, DO  Admission Diagnosis  UTI (lower urinary tract infection) [599.0] Falls [E888.9] Fall - Hip Pain  Discharge Diagnosis     Active Problems:  Type 2 diabetes mellitus with vascular disease  Dementia  MI (myocardial infarction)  UTI (lower urinary tract infection)  CAD (coronary artery disease)  Hypertensive heart disease without CHF  Peripheral vascular disease      Past Medical History  Diagnosis Date  . Dementia   . Coronary artery disease   . Peripheral vascular disease   . Hypertensive heart disease without CHF   . Hyperlipidemia   . Chronic diastolic heart failure   . Aortic valve disorder   . GERD (gastroesophageal reflux disease)   . Peripheral neuropathy     Past Surgical History  Procedure Date  . Tonsillectomy   . Laparoscopic cholecystectomy 2001  . Coronary artery bypass graft 1981, [id number]    By Dr. Maudie Mercury  . Repair of lymphocoele   . Carotid endarterectomy 12/2001  . Repair of left iliac psuedoaneurysm following cath 2006  . Breast enhancement surgery      Recommendations for primary care physician for things to follow:   He is followed patient's Accu-Chek results and glycemic control closely.   Discharge Diagnoses:   Active Problems:  Type 2 diabetes mellitus with vascular disease  Dementia  MI (myocardial infarction)  UTI (lower urinary tract infection)  CAD (coronary artery disease)  Hypertensive heart disease without CHF  Peripheral vascular disease    Discharge Condition: Stable   Diet recommendation: See Discharge Instructions below   Consults Cardiology   History of present illness and  Hospital Course:   See H&P, Labs, Consult and Test reports for all details in brief, patient was admitted for UTI causing generalized weakness and falls, urine grew Escherichia coli which was pansensitive, she was treated with IV fluids, IV antibiotics with good results, she is now afebrile, able to ablate with help of PT, our recommendations worse patient be placed in a rehabilitation however patient and her son did not want that, she will now be sent home with 24-hour supervision while family which son has agreed to provide, she will also get walker with 5 wheels, she will get home PT and RN    Patient has history of CAD, dyslipidemia, hypertension. She had EKG changes upon admission which showed chronic T-wave inversion with ST depression, mostly in the inferolateral leads, she had troponin rise but it was in non-ACS pattern, she never had any chest pain or chest symptoms, she was seen by her cardiologist Dr. Viann Fish who recommended to continue her home medications which were continued, her beta blocker dose has been increased for better blood pressure control, she is already on Plavix and was treated with aspirin while she was here, at this point  she will be discharged on Plavix along with ARB, statin in door and hydralazine. She will follow with Dr. Donnie Aho.  She had low blood sugars upon admission, even during her hospitalization her sugars were low enough not to require sliding scale, I am discontinuing her Amaryl, we'll continue her home does appear, have requested patient to continue checking her Accu-Cheks q. a.c. at bedtime and show the log to her primary care physician.  Lab Results  Component Value Date   HGBA1C 5.9* 05/23/2012    CBG (last 3)   Basename 05/25/12 0555 05/24/12 2352 05/24/12 1939  GLUCAP 101* 114* 143*     She has chronic kidney disease stage III creatinine is table around 1.3, she has chronic diastolic CHF with last EF being around 60% this admission, she is compensated from that  CHF standpoint.    Today   Subjective:   [name] today has no headache,no chest abdominal pain,no new weakness tingling or numbness, feels much better wants to go home today.   Objective:   Blood pressure 149/65, pulse 65, temperature 98 F (36.7 C), temperature source Oral, resp. rate 18, height 5\' 6"  (1.676 m), weight 60.2 kg (132 lb 11.5 oz), SpO2 99.00%.   Intake/Output Summary (Last 24 hours) at 05/25/12 1002 Last data filed at 05/25/12 0808  Gross per 24 hour  Intake    752 ml  Output    975 ml  Net   -223 ml    Exam Awake Alert, Oriented *3, No new F.N deficits, Normal affect .AT,PERRAL Supple Neck,No JVD, No cervical lymphadenopathy appriciated.  Symmetrical Chest wall movement, Good air movement bilaterally, CTAB RRR,No Gallops,Rubs or new Murmurs, No Parasternal Heave +ve B.Sounds, Abd Soft, Non tender, No organomegaly appriciated, No rebound -guarding or rigidity. No Cyanosis, Clubbing or edema, No new Rash or bruise  Data Review   Major procedures and Radiology Reports - PLEASE review detailed and final reports for all details in brief -   Echogram  - Left ventricle: The cavity size was normal. There was moderate concentric hypertrophy. Systolic function was normal. The estimated ejection fraction was in the range of 55% to 60%. Wall motion was normal; there were no regional wall motion abnormalities. Doppler parameters are consistent with a reversible restrictive pattern, indicative of decreased left ventricular diastolic compliance and/or increased left atrial pressure (grade 3 diastolic dysfunction). - Mitral valve: Mildly calcified annulus. Mildly calcified leaflets . Mild to moderate regurgitation. - Left atrium: The atrium was mildly dilated. - Pulmonary arteries: Systolic pressure was moderately increased. PA peak pressure: 48mm Hg (S).     Dg Chest 2 View  05/23/2012  *RADIOLOGY REPORT*  Clinical Data: Status post fall; chest  pain.  Weakness and fever.  CHEST - 2 VIEW  Comparison: Chest radiograph performed 05/16/2012  Findings: The lungs are well-aerated and clear.  There is no evidence of focal opacification, pleural effusion or pneumothorax. Mildly increased density at the lung bases is thought to reflect overlying soft tissues.  The heart is normal in size; the patient is status post median sternotomy.  Calcification is noted at the aortic arch.  No acute osseous abnormalities are seen.  Bilateral breast implants are noted.  Clips are noted within the right upper quadrant, reflecting prior cholecystectomy.  IMPRESSION: No acute cardiopulmonary process seen.   Original Report Authenticated By: Tonia Ghent, M.D.     Dg Hip Complete Left  05/23/2012  *RADIOLOGY REPORT*  Clinical Data: Status post fall; left hip pain.  LEFT  HIP - COMPLETE 2+ VIEW  Comparison: None.  Findings: There is no evidence of fracture or dislocation.  Both femoral heads are seated normally within their respective acetabula.  No significant degenerative change is appreciated. Mild sclerotic change is seen at the sacroiliac joints.  The visualized bowel gas pattern is grossly unremarkable in appearance.  Scattered phleboliths are noted within the pelvis. Postoperative change is noted at both inguinal regions.  IMPRESSION: No evidence of fracture or dislocation.   Original Report Authenticated By: Tonia Ghent, M.D.    Dg Hip Complete Right  05/23/2012  *RADIOLOGY REPORT*  Clinical Data: Status post fall; right hip pain.  RIGHT HIP - COMPLETE 2+ VIEW  Comparison: Right hip radiographs performed 05/17/2012  Findings: There is no evidence of fracture or dislocation.  Both femoral heads are seated normally within their respective acetabula.  The proximal right femur appears intact.  No significant degenerative change is appreciated.  Mild sclerotic change is seen at the sacroiliac joints.  The visualized bowel gas pattern is grossly unremarkable in appearance.   Scattered phleboliths are noted within the pelvis. Postoperative change is noted at both inguinal regions.  IMPRESSION: No evidence of fracture or dislocation.   Original Report Authenticated By: Tonia Ghent, M.D.      Ct Head Wo Contrast  05/23/2012  *RADIOLOGY REPORT*  Clinical Data: Status post fall; weakness.  CT HEAD WITHOUT CONTRAST  Technique:  Contiguous axial images were obtained from the base of the skull through the vertex without contrast.  Comparison: MRI/MRA of the brain performed 05/14/2006  Findings: There is no evidence of acute infarction, mass lesion, or intra- or extra-axial hemorrhage on CT.  The posterior fossa, including the cerebellum, brainstem and fourth ventricle, is within normal limits.  Mildly prominent focal nodularity at the anterior right cerebellar hemisphere is stable from the prior MRI and within normal limits.  The third and lateral ventricles, and basal ganglia are unremarkable in appearance.  The cerebral hemispheres are symmetric in appearance, with normal gray- white differentiation.  No mass effect or midline shift is seen.  There is no evidence of fracture; visualized osseous structures are unremarkable in appearance.  The visualized portions of the orbits are within normal limits.  The paranasal sinuses and mastoid air cells are well-aerated.  No significant soft tissue abnormalities are seen.  Cerumen is noted within the external auditory canals bilaterally.  IMPRESSION:  1.  No evidence of traumatic intracranial injury or fracture. 2.  Cerumen noted within the external auditory canals bilaterally.   Original Report Authenticated By: Tonia Ghent, M.D.    Ct Abdomen Pelvis W Contrast  05/17/2012  *RADIOLOGY REPORT*  Clinical Data: 79 year old female with fall and right abdominal pelvic pain.  CT ABDOMEN AND PELVIS WITH CONTRAST  Technique:  Multidetector CT imaging of the abdomen and pelvis was performed following the standard protocol during bolus  administration of intravenous contrast.  Contrast: 80mL OMNIPAQUE IOHEXOL 300 MG/ML  SOLN  Comparison: 01/07/2009 CT  Findings: The liver, spleen, pancreas, adrenal glands, and kidneys are unremarkable except for bilateral renal cortical thinning. The patient is status post hysterectomy and cholecystectomy. No free fluid, enlarged lymph nodes, biliary dilation or abdominal aortic aneurysm identified.  Diffuse colonic diverticulosis noted without evidence of diverticulitis. No other bowel abnormalities are identified. The bladder is unremarkable.  No acute or suspicious bony abnormalities are identified.  IMPRESSION: No evidence of acute abnormality.  Diffuse colonic diverticulosis without diverticulitis.   Original Report Authenticated By: Harmon Pier, M.D.  US Renal  05/23/2012  *RADIOLOGY REPORT*  Clinical Data: Left flank pain  RENAL/URINARY TRACT ULTRASOUND COMPLETE  Comparison:  CT abdomen pelvis dated 05/17/2012  Findings:  Right Kidney:  Measures 9.4 cm.  No mass or hydronephrosis.  Left Kidney:  Measures 9.4 cm.  No mass or hydronephrosis.  Bladder:  Underdistended.  IMPRESSION: Negative renal ultrasound.   Original Report Authenticated By: Charline Bills, M.D.    Dg Abd Portable 1v  05/24/2012  *RADIOLOGY REPORT*  Clinical Data: Nausea and weakness  PORTABLE ABDOMEN - 1 VIEW  Comparison: None.  Findings: Scattered large and small bowel gas is noted.  No free air is seen.  No abnormal mass or abnormal calcifications are noted.  Changes of prior cholecystectomy are seen.  IMPRESSION: Nonspecific abdomen.   Original Report Authenticated By: Alcide Clever, M.D.     Micro Results      Recent Results (from the past 240 hour(s))  URINE CULTURE     Status: Normal   Collection Time   05/17/12  1:30 AM      Component Value Range Status Comment   Specimen Description URINE, CATHETERIZED   Final    Special Requests NONE   Final    Culture  Setup Time 05/17/2012 08:34   Final    Colony Count NO  GROWTH   Final    Culture NO GROWTH   Final    Report Status 05/18/2012 FINAL   Final   URINE CULTURE     Status: Normal   Collection Time   05/23/12  2:57 AM      Component Value Range Status Comment   Specimen Description URINE, CATHETERIZED   Final    Special Requests NONE   Final    Culture  Setup Time 05/23/2012 03:55   Final    Colony Count >=100,000 COLONIES/ML   Final    Culture ESCHERICHIA COLI   Final    Report Status 05/24/2012 FINAL   Final    Organism ID, Bacteria ESCHERICHIA COLI   Final   CULTURE, BLOOD (ROUTINE X 2)     Status: Normal (Preliminary result)   Collection Time   05/23/12  4:00 PM      Component Value Range Status Comment   Specimen Description BLOOD HAND RIGHT   Final    Special Requests BOTTLES DRAWN AEROBIC ONLY 8CC   Final    Culture  Setup Time 05/23/2012 21:40   Final    Culture     Final    Value:        BLOOD CULTURE RECEIVED NO GROWTH TO DATE CULTURE WILL BE HELD FOR 5 DAYS BEFORE ISSUING A FINAL NEGATIVE REPORT   Report Status PENDING   Incomplete   CULTURE, BLOOD (ROUTINE X 2)     Status: Normal (Preliminary result)   Collection Time   05/23/12  4:15 PM      Component Value Range Status Comment   Specimen Description BLOOD HAND RIGHT   Final    Special Requests BOTTLES DRAWN AEROBIC ONLY 8CC   Final    Culture  Setup Time 05/23/2012 21:40   Final    Culture     Final    Value:        BLOOD CULTURE RECEIVED NO GROWTH TO DATE CULTURE WILL BE HELD FOR 5 DAYS BEFORE ISSUING A FINAL NEGATIVE REPORT   Report Status PENDING   Incomplete      CBC w Diff: Lab Results  Component Value Date  WBC 8.4 05/24/2012   HGB 9.0* 05/24/2012   HCT 27.6* 05/24/2012   PLT 136* 05/24/2012   LYMPHOPCT 24 05/16/2012   MONOPCT 10 05/16/2012   EOSPCT 2 05/16/2012   BASOPCT 0 05/16/2012    CMP: Lab Results  Component Value Date   NA 135 05/24/2012   K 4.2 05/24/2012   CL 103 05/24/2012   CO2 20 05/24/2012   BUN 20 05/24/2012   CREATININE 0.95 05/24/2012   PROT 6.7 05/16/2012    ALBUMIN 3.8 05/16/2012   BILITOT 0.3 05/16/2012   ALKPHOS 70 05/16/2012   AST 18 05/16/2012   ALT 11 05/16/2012  .   Discharge Instructions     Follow with Primary MD Lupita Raider, MD in 3 days   Get CBC, CMP, checked 3 days by Primary MD and again as instructed by your Primary MD.    Get Medicines reviewed and adjusted.  Please request your Prim.MD to go over all Hospital Tests and Procedure/Radiological results at the follow up, please get all Hospital records sent to your Prim MD by signing hospital release before you go home.  Activity: As tolerated with Full fall precautions use walker/cane & assistance as needed  Accuchecks 4 times/day, Once in AM empty stomach and then before each meal. Log in all results and show them to your Prim.MD in 3 days. If any glucose reading is under 80 or above 300 call your Prim MD immidiately. Follow Low glucose instructions for glucose under 80 as instructed.   Diet:  Heart healthy low carbohydrate, Aspiration precautions.  For Heart failure patients - Check your Weight same time everyday, if you gain over 2 pounds, or you develop in leg swelling, experience more shortness of breath or chest pain, call your Primary MD immediately. Follow Cardiac Low Salt Diet and 1.8 lit/day fluid restriction.  Disposition Home   If you experience worsening of your admission symptoms, develop shortness of breath, life threatening emergency, suicidal or homicidal thoughts you must seek medical attention immediately by calling 911 or calling your MD immediately  if symptoms less severe.  You Must read complete instructions/literature along with all the possible adverse reactions/side effects for all the Medicines you take and that have been prescribed to you. Take any new Medicines after you have completely understood and accpet all the possible adverse reactions/side effects.   Do not drive and provide baby sitting services if your were admitted for  syncope or siezures until you have seen by Primary MD or a Neurologist and advised to do so again.  Do not drive when taking Pain medications.    Do not take more than prescribed Pain, Sleep and Anxiety Medications  Special Instructions: If you have smoked or chewed Tobacco  in the last 2 yrs please stop smoking, stop any regular Alcohol  and or any Recreational drug use.  Wear Seat belts while driving.  Follow-up Information    Follow up with SHAW,KIMBERLEE, MD. Schedule an appointment as soon as possible for a visit in 3 days.   Contact information:   [address] [place] [address] [phone]       Follow up with Donnie Aho JR,W SPENCER, MD. Schedule an appointment as soon as possible for a visit in 1 week.   Contact information:   [address] [place] [address] [phone]            Discharge Medications     Medication List     As  of 05/25/2012 10:02 AM    START taking these medications         ciprofloxacin 500 MG tablet   Commonly known as: CIPRO   Take 1 tablet (500 mg total) by mouth 2 (two) times daily.      CHANGE how you take these medications         metoprolol 50 MG tablet   Commonly known as: LOPRESSOR   Take 1 tablet (50 mg total) by mouth 2 (two) times daily.   What changed: - medication strength - dose      CONTINUE taking these medications         clopidogrel 75 MG tablet   Commonly known as: PLAVIX      donepezil 10 MG tablet   Commonly known as: ARICEPT      ezetimibe 10 MG tablet   Commonly known as: ZETIA      furosemide 20 MG tablet   Commonly known as: LASIX      gabapentin 300 MG capsule   Commonly known as: NEURONTIN      hydrALAZINE 50 MG tablet   Commonly known as: APRESOLINE      isosorbide mononitrate 30 MG 24 hr tablet   Commonly known as: IMDUR      losartan 50 MG tablet   Commonly known as: COZAAR      simvastatin 40 MG tablet   Commonly known as: ZOCOR       traMADol 50 MG tablet   Commonly known as: ULTRAM   Take 1 tablet (50 mg total) by mouth every 6 (six) hours as needed for pain.      STOP taking these medications         glimepiride 2 MG tablet   Commonly known as: AMARYL          Where to get your medications    These are the prescriptions that you need to pick up. We sent them to a specific pharmacy, so you will need to go there to get them.   [id number] - [place], [place] - [address]    [address] [place] [address]    Phone: [phone]        ciprofloxacin 500 MG tablet   metoprolol 50 MG tablet               Total Time in preparing paper work, data evaluation and todays exam - 35 minutes  Leroy Sea M.D on 05/25/2012 at 10:02 AM  [facility] Office  [phone]

## 2012-05-27 NOTE — Progress Notes (Signed)
Utilization Review Completed.   Shwanda Soltis, RN, BSN Nurse Case Manager  [phone]  

## 2012-05-28 NOTE — Progress Notes (Signed)
Patient referred to CSW for SNF but was d/c'd home on 05/26/11 (weekend). Was not seen by CSW prior to d/c.  RNCM was aware of patient but no d/c needs appeared to be indicated.  Lorri Frederick. [place]  [phone]

## 2012-05-29 DIAGNOSIS — E1159 Type 2 diabetes mellitus with other circulatory complications: Secondary | ICD-10-CM | POA: Diagnosis not present

## 2012-05-29 DIAGNOSIS — N390 Urinary tract infection, site not specified: Secondary | ICD-10-CM | POA: Diagnosis not present

## 2012-05-29 DIAGNOSIS — I2510 Atherosclerotic heart disease of native coronary artery without angina pectoris: Secondary | ICD-10-CM | POA: Diagnosis not present

## 2012-05-29 DIAGNOSIS — I798 Other disorders of arteries, arterioles and capillaries in diseases classified elsewhere: Secondary | ICD-10-CM | POA: Diagnosis not present

## 2012-05-29 DIAGNOSIS — F0390 Unspecified dementia without behavioral disturbance: Secondary | ICD-10-CM | POA: Diagnosis not present

## 2012-05-29 DIAGNOSIS — I509 Heart failure, unspecified: Secondary | ICD-10-CM | POA: Diagnosis not present

## 2012-05-29 LAB — CULTURE, BLOOD (ROUTINE X 2): Culture: NO GROWTH

## 2012-05-30 DIAGNOSIS — I2510 Atherosclerotic heart disease of native coronary artery without angina pectoris: Secondary | ICD-10-CM | POA: Diagnosis not present

## 2012-05-30 DIAGNOSIS — F0390 Unspecified dementia without behavioral disturbance: Secondary | ICD-10-CM | POA: Diagnosis not present

## 2012-05-30 DIAGNOSIS — I798 Other disorders of arteries, arterioles and capillaries in diseases classified elsewhere: Secondary | ICD-10-CM | POA: Diagnosis not present

## 2012-05-30 DIAGNOSIS — N390 Urinary tract infection, site not specified: Secondary | ICD-10-CM | POA: Diagnosis not present

## 2012-05-30 DIAGNOSIS — I509 Heart failure, unspecified: Secondary | ICD-10-CM | POA: Diagnosis not present

## 2012-05-30 DIAGNOSIS — E1159 Type 2 diabetes mellitus with other circulatory complications: Secondary | ICD-10-CM | POA: Diagnosis not present

## 2012-05-31 DIAGNOSIS — Z9181 History of falling: Secondary | ICD-10-CM | POA: Diagnosis not present

## 2012-05-31 DIAGNOSIS — R9431 Abnormal electrocardiogram [ECG] [EKG]: Secondary | ICD-10-CM | POA: Diagnosis not present

## 2012-05-31 DIAGNOSIS — K921 Melena: Secondary | ICD-10-CM | POA: Diagnosis not present

## 2012-05-31 DIAGNOSIS — R109 Unspecified abdominal pain: Secondary | ICD-10-CM | POA: Diagnosis not present

## 2012-05-31 DIAGNOSIS — I10 Essential (primary) hypertension: Secondary | ICD-10-CM | POA: Diagnosis not present

## 2012-05-31 DIAGNOSIS — E1129 Type 2 diabetes mellitus with other diabetic kidney complication: Secondary | ICD-10-CM | POA: Diagnosis not present

## 2012-05-31 DIAGNOSIS — R195 Other fecal abnormalities: Secondary | ICD-10-CM | POA: Diagnosis not present

## 2012-05-31 DIAGNOSIS — N390 Urinary tract infection, site not specified: Secondary | ICD-10-CM | POA: Diagnosis not present

## 2012-05-31 DIAGNOSIS — F0390 Unspecified dementia without behavioral disturbance: Secondary | ICD-10-CM | POA: Diagnosis not present

## 2012-06-03 DIAGNOSIS — I798 Other disorders of arteries, arterioles and capillaries in diseases classified elsewhere: Secondary | ICD-10-CM | POA: Diagnosis not present

## 2012-06-03 DIAGNOSIS — I509 Heart failure, unspecified: Secondary | ICD-10-CM | POA: Diagnosis not present

## 2012-06-03 DIAGNOSIS — I2510 Atherosclerotic heart disease of native coronary artery without angina pectoris: Secondary | ICD-10-CM | POA: Diagnosis not present

## 2012-06-03 DIAGNOSIS — F0390 Unspecified dementia without behavioral disturbance: Secondary | ICD-10-CM | POA: Diagnosis not present

## 2012-06-03 DIAGNOSIS — E1159 Type 2 diabetes mellitus with other circulatory complications: Secondary | ICD-10-CM | POA: Diagnosis not present

## 2012-06-03 DIAGNOSIS — N390 Urinary tract infection, site not specified: Secondary | ICD-10-CM | POA: Diagnosis not present

## 2012-06-04 DIAGNOSIS — I798 Other disorders of arteries, arterioles and capillaries in diseases classified elsewhere: Secondary | ICD-10-CM | POA: Diagnosis not present

## 2012-06-04 DIAGNOSIS — F0390 Unspecified dementia without behavioral disturbance: Secondary | ICD-10-CM | POA: Diagnosis not present

## 2012-06-04 DIAGNOSIS — E1159 Type 2 diabetes mellitus with other circulatory complications: Secondary | ICD-10-CM | POA: Diagnosis not present

## 2012-06-04 DIAGNOSIS — I509 Heart failure, unspecified: Secondary | ICD-10-CM | POA: Diagnosis not present

## 2012-06-04 DIAGNOSIS — I2510 Atherosclerotic heart disease of native coronary artery without angina pectoris: Secondary | ICD-10-CM | POA: Diagnosis not present

## 2012-06-04 DIAGNOSIS — N390 Urinary tract infection, site not specified: Secondary | ICD-10-CM | POA: Diagnosis not present

## 2012-06-06 DIAGNOSIS — E1159 Type 2 diabetes mellitus with other circulatory complications: Secondary | ICD-10-CM | POA: Diagnosis not present

## 2012-06-06 DIAGNOSIS — I509 Heart failure, unspecified: Secondary | ICD-10-CM | POA: Diagnosis not present

## 2012-06-06 DIAGNOSIS — F0390 Unspecified dementia without behavioral disturbance: Secondary | ICD-10-CM | POA: Diagnosis not present

## 2012-06-06 DIAGNOSIS — I798 Other disorders of arteries, arterioles and capillaries in diseases classified elsewhere: Secondary | ICD-10-CM | POA: Diagnosis not present

## 2012-06-06 DIAGNOSIS — N390 Urinary tract infection, site not specified: Secondary | ICD-10-CM | POA: Diagnosis not present

## 2012-06-06 DIAGNOSIS — I2510 Atherosclerotic heart disease of native coronary artery without angina pectoris: Secondary | ICD-10-CM | POA: Diagnosis not present

## 2012-06-07 DIAGNOSIS — E1159 Type 2 diabetes mellitus with other circulatory complications: Secondary | ICD-10-CM | POA: Diagnosis not present

## 2012-06-07 DIAGNOSIS — F0390 Unspecified dementia without behavioral disturbance: Secondary | ICD-10-CM | POA: Diagnosis not present

## 2012-06-07 DIAGNOSIS — I509 Heart failure, unspecified: Secondary | ICD-10-CM | POA: Diagnosis not present

## 2012-06-07 DIAGNOSIS — N390 Urinary tract infection, site not specified: Secondary | ICD-10-CM | POA: Diagnosis not present

## 2012-06-07 DIAGNOSIS — I798 Other disorders of arteries, arterioles and capillaries in diseases classified elsewhere: Secondary | ICD-10-CM | POA: Diagnosis not present

## 2012-06-07 DIAGNOSIS — I2510 Atherosclerotic heart disease of native coronary artery without angina pectoris: Secondary | ICD-10-CM | POA: Diagnosis not present

## 2012-06-10 DIAGNOSIS — F0390 Unspecified dementia without behavioral disturbance: Secondary | ICD-10-CM | POA: Diagnosis not present

## 2012-06-10 DIAGNOSIS — E1159 Type 2 diabetes mellitus with other circulatory complications: Secondary | ICD-10-CM | POA: Diagnosis not present

## 2012-06-10 DIAGNOSIS — I798 Other disorders of arteries, arterioles and capillaries in diseases classified elsewhere: Secondary | ICD-10-CM | POA: Diagnosis not present

## 2012-06-10 DIAGNOSIS — N390 Urinary tract infection, site not specified: Secondary | ICD-10-CM | POA: Diagnosis not present

## 2012-06-10 DIAGNOSIS — I509 Heart failure, unspecified: Secondary | ICD-10-CM | POA: Diagnosis not present

## 2012-06-10 DIAGNOSIS — I2510 Atherosclerotic heart disease of native coronary artery without angina pectoris: Secondary | ICD-10-CM | POA: Diagnosis not present

## 2012-06-12 DIAGNOSIS — I509 Heart failure, unspecified: Secondary | ICD-10-CM | POA: Diagnosis not present

## 2012-06-12 DIAGNOSIS — I798 Other disorders of arteries, arterioles and capillaries in diseases classified elsewhere: Secondary | ICD-10-CM | POA: Diagnosis not present

## 2012-06-12 DIAGNOSIS — F0390 Unspecified dementia without behavioral disturbance: Secondary | ICD-10-CM | POA: Diagnosis not present

## 2012-06-12 DIAGNOSIS — N390 Urinary tract infection, site not specified: Secondary | ICD-10-CM | POA: Diagnosis not present

## 2012-06-12 DIAGNOSIS — I2510 Atherosclerotic heart disease of native coronary artery without angina pectoris: Secondary | ICD-10-CM | POA: Diagnosis not present

## 2012-06-12 DIAGNOSIS — E1159 Type 2 diabetes mellitus with other circulatory complications: Secondary | ICD-10-CM | POA: Diagnosis not present

## 2012-06-13 DIAGNOSIS — I2510 Atherosclerotic heart disease of native coronary artery without angina pectoris: Secondary | ICD-10-CM | POA: Diagnosis not present

## 2012-06-13 DIAGNOSIS — I509 Heart failure, unspecified: Secondary | ICD-10-CM | POA: Diagnosis not present

## 2012-06-13 DIAGNOSIS — E1159 Type 2 diabetes mellitus with other circulatory complications: Secondary | ICD-10-CM | POA: Diagnosis not present

## 2012-06-13 DIAGNOSIS — I798 Other disorders of arteries, arterioles and capillaries in diseases classified elsewhere: Secondary | ICD-10-CM | POA: Diagnosis not present

## 2012-06-13 DIAGNOSIS — F0390 Unspecified dementia without behavioral disturbance: Secondary | ICD-10-CM | POA: Diagnosis not present

## 2012-06-13 DIAGNOSIS — N390 Urinary tract infection, site not specified: Secondary | ICD-10-CM | POA: Diagnosis not present

## 2012-06-14 DIAGNOSIS — Z0000 Encounter for general adult medical examination without abnormal findings: Secondary | ICD-10-CM | POA: Diagnosis not present

## 2012-06-14 DIAGNOSIS — F0390 Unspecified dementia without behavioral disturbance: Secondary | ICD-10-CM | POA: Diagnosis not present

## 2012-06-14 DIAGNOSIS — E782 Mixed hyperlipidemia: Secondary | ICD-10-CM | POA: Diagnosis not present

## 2012-06-14 DIAGNOSIS — I10 Essential (primary) hypertension: Secondary | ICD-10-CM | POA: Diagnosis not present

## 2012-06-14 DIAGNOSIS — E1129 Type 2 diabetes mellitus with other diabetic kidney complication: Secondary | ICD-10-CM | POA: Diagnosis not present

## 2012-06-17 DIAGNOSIS — E1159 Type 2 diabetes mellitus with other circulatory complications: Secondary | ICD-10-CM | POA: Diagnosis not present

## 2012-06-17 DIAGNOSIS — I2510 Atherosclerotic heart disease of native coronary artery without angina pectoris: Secondary | ICD-10-CM | POA: Diagnosis not present

## 2012-06-17 DIAGNOSIS — I509 Heart failure, unspecified: Secondary | ICD-10-CM | POA: Diagnosis not present

## 2012-06-17 DIAGNOSIS — N390 Urinary tract infection, site not specified: Secondary | ICD-10-CM | POA: Diagnosis not present

## 2012-06-17 DIAGNOSIS — I798 Other disorders of arteries, arterioles and capillaries in diseases classified elsewhere: Secondary | ICD-10-CM | POA: Diagnosis not present

## 2012-06-17 DIAGNOSIS — F0390 Unspecified dementia without behavioral disturbance: Secondary | ICD-10-CM | POA: Diagnosis not present

## 2012-06-20 DIAGNOSIS — I509 Heart failure, unspecified: Secondary | ICD-10-CM | POA: Diagnosis not present

## 2012-06-20 DIAGNOSIS — N390 Urinary tract infection, site not specified: Secondary | ICD-10-CM | POA: Diagnosis not present

## 2012-06-20 DIAGNOSIS — E1159 Type 2 diabetes mellitus with other circulatory complications: Secondary | ICD-10-CM | POA: Diagnosis not present

## 2012-06-20 DIAGNOSIS — F0390 Unspecified dementia without behavioral disturbance: Secondary | ICD-10-CM | POA: Diagnosis not present

## 2012-06-20 DIAGNOSIS — I798 Other disorders of arteries, arterioles and capillaries in diseases classified elsewhere: Secondary | ICD-10-CM | POA: Diagnosis not present

## 2012-06-20 DIAGNOSIS — I2510 Atherosclerotic heart disease of native coronary artery without angina pectoris: Secondary | ICD-10-CM | POA: Diagnosis not present

## 2012-06-21 DIAGNOSIS — I798 Other disorders of arteries, arterioles and capillaries in diseases classified elsewhere: Secondary | ICD-10-CM | POA: Diagnosis not present

## 2012-06-21 DIAGNOSIS — N390 Urinary tract infection, site not specified: Secondary | ICD-10-CM | POA: Diagnosis not present

## 2012-06-21 DIAGNOSIS — I2510 Atherosclerotic heart disease of native coronary artery without angina pectoris: Secondary | ICD-10-CM | POA: Diagnosis not present

## 2012-06-21 DIAGNOSIS — F0390 Unspecified dementia without behavioral disturbance: Secondary | ICD-10-CM | POA: Diagnosis not present

## 2012-06-21 DIAGNOSIS — I509 Heart failure, unspecified: Secondary | ICD-10-CM | POA: Diagnosis not present

## 2012-06-21 DIAGNOSIS — E1159 Type 2 diabetes mellitus with other circulatory complications: Secondary | ICD-10-CM | POA: Diagnosis not present

## 2012-06-24 DIAGNOSIS — N390 Urinary tract infection, site not specified: Secondary | ICD-10-CM | POA: Diagnosis not present

## 2012-06-24 DIAGNOSIS — F0390 Unspecified dementia without behavioral disturbance: Secondary | ICD-10-CM | POA: Diagnosis not present

## 2012-06-24 DIAGNOSIS — I2510 Atherosclerotic heart disease of native coronary artery without angina pectoris: Secondary | ICD-10-CM | POA: Diagnosis not present

## 2012-06-24 DIAGNOSIS — E1159 Type 2 diabetes mellitus with other circulatory complications: Secondary | ICD-10-CM | POA: Diagnosis not present

## 2012-06-24 DIAGNOSIS — I798 Other disorders of arteries, arterioles and capillaries in diseases classified elsewhere: Secondary | ICD-10-CM | POA: Diagnosis not present

## 2012-06-24 DIAGNOSIS — I509 Heart failure, unspecified: Secondary | ICD-10-CM | POA: Diagnosis not present

## 2012-06-26 DIAGNOSIS — E1159 Type 2 diabetes mellitus with other circulatory complications: Secondary | ICD-10-CM | POA: Diagnosis not present

## 2012-06-26 DIAGNOSIS — I2510 Atherosclerotic heart disease of native coronary artery without angina pectoris: Secondary | ICD-10-CM | POA: Diagnosis not present

## 2012-06-26 DIAGNOSIS — I509 Heart failure, unspecified: Secondary | ICD-10-CM | POA: Diagnosis not present

## 2012-06-26 DIAGNOSIS — F0390 Unspecified dementia without behavioral disturbance: Secondary | ICD-10-CM | POA: Diagnosis not present

## 2012-06-26 DIAGNOSIS — I798 Other disorders of arteries, arterioles and capillaries in diseases classified elsewhere: Secondary | ICD-10-CM | POA: Diagnosis not present

## 2012-06-26 DIAGNOSIS — N390 Urinary tract infection, site not specified: Secondary | ICD-10-CM | POA: Diagnosis not present

## 2012-07-01 DIAGNOSIS — I509 Heart failure, unspecified: Secondary | ICD-10-CM | POA: Diagnosis not present

## 2012-07-01 DIAGNOSIS — F0390 Unspecified dementia without behavioral disturbance: Secondary | ICD-10-CM | POA: Diagnosis not present

## 2012-07-01 DIAGNOSIS — N390 Urinary tract infection, site not specified: Secondary | ICD-10-CM | POA: Diagnosis not present

## 2012-07-01 DIAGNOSIS — E1159 Type 2 diabetes mellitus with other circulatory complications: Secondary | ICD-10-CM | POA: Diagnosis not present

## 2012-07-01 DIAGNOSIS — I798 Other disorders of arteries, arterioles and capillaries in diseases classified elsewhere: Secondary | ICD-10-CM | POA: Diagnosis not present

## 2012-07-01 DIAGNOSIS — I2510 Atherosclerotic heart disease of native coronary artery without angina pectoris: Secondary | ICD-10-CM | POA: Diagnosis not present

## 2012-07-02 DIAGNOSIS — N390 Urinary tract infection, site not specified: Secondary | ICD-10-CM | POA: Diagnosis not present

## 2012-07-02 DIAGNOSIS — F0390 Unspecified dementia without behavioral disturbance: Secondary | ICD-10-CM | POA: Diagnosis not present

## 2012-07-02 DIAGNOSIS — E1159 Type 2 diabetes mellitus with other circulatory complications: Secondary | ICD-10-CM | POA: Diagnosis not present

## 2012-07-02 DIAGNOSIS — I798 Other disorders of arteries, arterioles and capillaries in diseases classified elsewhere: Secondary | ICD-10-CM | POA: Diagnosis not present

## 2012-07-02 DIAGNOSIS — I509 Heart failure, unspecified: Secondary | ICD-10-CM | POA: Diagnosis not present

## 2012-07-02 DIAGNOSIS — I2510 Atherosclerotic heart disease of native coronary artery without angina pectoris: Secondary | ICD-10-CM | POA: Diagnosis not present

## 2012-07-08 DIAGNOSIS — I798 Other disorders of arteries, arterioles and capillaries in diseases classified elsewhere: Secondary | ICD-10-CM | POA: Diagnosis not present

## 2012-07-08 DIAGNOSIS — N390 Urinary tract infection, site not specified: Secondary | ICD-10-CM | POA: Diagnosis not present

## 2012-07-08 DIAGNOSIS — F0390 Unspecified dementia without behavioral disturbance: Secondary | ICD-10-CM | POA: Diagnosis not present

## 2012-07-08 DIAGNOSIS — E1159 Type 2 diabetes mellitus with other circulatory complications: Secondary | ICD-10-CM | POA: Diagnosis not present

## 2012-07-08 DIAGNOSIS — I509 Heart failure, unspecified: Secondary | ICD-10-CM | POA: Diagnosis not present

## 2012-07-08 DIAGNOSIS — I2510 Atherosclerotic heart disease of native coronary artery without angina pectoris: Secondary | ICD-10-CM | POA: Diagnosis not present

## 2012-07-10 DIAGNOSIS — N390 Urinary tract infection, site not specified: Secondary | ICD-10-CM | POA: Diagnosis not present

## 2012-07-10 DIAGNOSIS — I509 Heart failure, unspecified: Secondary | ICD-10-CM | POA: Diagnosis not present

## 2012-07-10 DIAGNOSIS — F0390 Unspecified dementia without behavioral disturbance: Secondary | ICD-10-CM | POA: Diagnosis not present

## 2012-07-10 DIAGNOSIS — E1159 Type 2 diabetes mellitus with other circulatory complications: Secondary | ICD-10-CM | POA: Diagnosis not present

## 2012-07-10 DIAGNOSIS — I798 Other disorders of arteries, arterioles and capillaries in diseases classified elsewhere: Secondary | ICD-10-CM | POA: Diagnosis not present

## 2012-07-10 DIAGNOSIS — I2510 Atherosclerotic heart disease of native coronary artery without angina pectoris: Secondary | ICD-10-CM | POA: Diagnosis not present

## 2012-07-12 DIAGNOSIS — E1159 Type 2 diabetes mellitus with other circulatory complications: Secondary | ICD-10-CM | POA: Diagnosis not present

## 2012-07-12 DIAGNOSIS — I2510 Atherosclerotic heart disease of native coronary artery without angina pectoris: Secondary | ICD-10-CM | POA: Diagnosis not present

## 2012-07-12 DIAGNOSIS — I798 Other disorders of arteries, arterioles and capillaries in diseases classified elsewhere: Secondary | ICD-10-CM | POA: Diagnosis not present

## 2012-07-12 DIAGNOSIS — I509 Heart failure, unspecified: Secondary | ICD-10-CM | POA: Diagnosis not present

## 2012-07-12 DIAGNOSIS — F0390 Unspecified dementia without behavioral disturbance: Secondary | ICD-10-CM | POA: Diagnosis not present

## 2012-07-12 DIAGNOSIS — N390 Urinary tract infection, site not specified: Secondary | ICD-10-CM | POA: Diagnosis not present

## 2012-07-24 DIAGNOSIS — I509 Heart failure, unspecified: Secondary | ICD-10-CM | POA: Diagnosis not present

## 2012-07-24 DIAGNOSIS — E1159 Type 2 diabetes mellitus with other circulatory complications: Secondary | ICD-10-CM | POA: Diagnosis not present

## 2012-07-24 DIAGNOSIS — F0390 Unspecified dementia without behavioral disturbance: Secondary | ICD-10-CM | POA: Diagnosis not present

## 2012-07-24 DIAGNOSIS — N390 Urinary tract infection, site not specified: Secondary | ICD-10-CM | POA: Diagnosis not present

## 2012-07-24 DIAGNOSIS — I798 Other disorders of arteries, arterioles and capillaries in diseases classified elsewhere: Secondary | ICD-10-CM | POA: Diagnosis not present

## 2012-07-24 DIAGNOSIS — I2510 Atherosclerotic heart disease of native coronary artery without angina pectoris: Secondary | ICD-10-CM | POA: Diagnosis not present

## 2012-10-10 DIAGNOSIS — I1310 Hypertensive heart and chronic kidney disease without heart failure, with stage 1 through stage 4 chronic kidney disease, or unspecified chronic kidney disease: Secondary | ICD-10-CM | POA: Diagnosis not present

## 2012-10-10 DIAGNOSIS — F0390 Unspecified dementia without behavioral disturbance: Secondary | ICD-10-CM | POA: Diagnosis not present

## 2012-10-10 DIAGNOSIS — E46 Unspecified protein-calorie malnutrition: Secondary | ICD-10-CM | POA: Diagnosis not present

## 2012-10-10 DIAGNOSIS — F329 Major depressive disorder, single episode, unspecified: Secondary | ICD-10-CM | POA: Diagnosis not present

## 2012-10-10 DIAGNOSIS — I2510 Atherosclerotic heart disease of native coronary artery without angina pectoris: Secondary | ICD-10-CM | POA: Diagnosis not present

## 2012-10-10 DIAGNOSIS — E1129 Type 2 diabetes mellitus with other diabetic kidney complication: Secondary | ICD-10-CM | POA: Diagnosis not present

## 2012-10-10 DIAGNOSIS — E782 Mixed hyperlipidemia: Secondary | ICD-10-CM | POA: Diagnosis not present

## 2012-12-04 DIAGNOSIS — G609 Hereditary and idiopathic neuropathy, unspecified: Secondary | ICD-10-CM | POA: Diagnosis not present

## 2012-12-04 DIAGNOSIS — M1990 Unspecified osteoarthritis, unspecified site: Secondary | ICD-10-CM | POA: Diagnosis not present

## 2013-06-17 DIAGNOSIS — N183 Chronic kidney disease, stage 3 unspecified: Secondary | ICD-10-CM | POA: Diagnosis not present

## 2013-06-17 DIAGNOSIS — E1129 Type 2 diabetes mellitus with other diabetic kidney complication: Secondary | ICD-10-CM | POA: Diagnosis not present

## 2013-06-17 DIAGNOSIS — E1149 Type 2 diabetes mellitus with other diabetic neurological complication: Secondary | ICD-10-CM | POA: Diagnosis not present

## 2013-06-17 DIAGNOSIS — E782 Mixed hyperlipidemia: Secondary | ICD-10-CM | POA: Diagnosis not present

## 2013-06-17 DIAGNOSIS — Z23 Encounter for immunization: Secondary | ICD-10-CM | POA: Diagnosis not present

## 2013-06-17 DIAGNOSIS — I1310 Hypertensive heart and chronic kidney disease without heart failure, with stage 1 through stage 4 chronic kidney disease, or unspecified chronic kidney disease: Secondary | ICD-10-CM | POA: Diagnosis not present

## 2013-06-17 DIAGNOSIS — E46 Unspecified protein-calorie malnutrition: Secondary | ICD-10-CM | POA: Diagnosis not present

## 2013-07-24 DIAGNOSIS — E119 Type 2 diabetes mellitus without complications: Secondary | ICD-10-CM | POA: Diagnosis not present

## 2013-08-19 DIAGNOSIS — I10 Essential (primary) hypertension: Secondary | ICD-10-CM | POA: Diagnosis not present

## 2013-08-19 DIAGNOSIS — M549 Dorsalgia, unspecified: Secondary | ICD-10-CM | POA: Diagnosis not present

## 2013-09-08 DIAGNOSIS — I119 Hypertensive heart disease without heart failure: Secondary | ICD-10-CM | POA: Diagnosis not present

## 2013-09-08 DIAGNOSIS — I2510 Atherosclerotic heart disease of native coronary artery without angina pectoris: Secondary | ICD-10-CM | POA: Diagnosis not present

## 2013-09-08 DIAGNOSIS — E785 Hyperlipidemia, unspecified: Secondary | ICD-10-CM | POA: Diagnosis not present

## 2013-09-08 DIAGNOSIS — I359 Nonrheumatic aortic valve disorder, unspecified: Secondary | ICD-10-CM | POA: Diagnosis not present

## 2013-09-08 DIAGNOSIS — E1159 Type 2 diabetes mellitus with other circulatory complications: Secondary | ICD-10-CM | POA: Diagnosis not present

## 2013-09-08 DIAGNOSIS — I5032 Chronic diastolic (congestive) heart failure: Secondary | ICD-10-CM | POA: Diagnosis not present

## 2013-09-08 DIAGNOSIS — R0789 Other chest pain: Secondary | ICD-10-CM | POA: Diagnosis not present

## 2013-09-08 DIAGNOSIS — I739 Peripheral vascular disease, unspecified: Secondary | ICD-10-CM | POA: Diagnosis not present

## 2013-10-17 ENCOUNTER — Other Ambulatory Visit: Payer: Self-pay | Admitting: Family Medicine

## 2013-10-17 DIAGNOSIS — R141 Gas pain: Secondary | ICD-10-CM | POA: Diagnosis not present

## 2013-10-17 DIAGNOSIS — R143 Flatulence: Principal | ICD-10-CM

## 2013-10-17 DIAGNOSIS — R142 Eructation: Principal | ICD-10-CM

## 2013-10-20 ENCOUNTER — Other Ambulatory Visit: Payer: Medicare Other

## 2013-10-23 ENCOUNTER — Ambulatory Visit
Admission: RE | Admit: 2013-10-23 | Discharge: 2013-10-23 | Disposition: A | Discharge status: Home / Self Care | Payer: Medicare Other | Source: Ambulatory Visit | Attending: Family Medicine | Admitting: Family Medicine

## 2013-10-23 DIAGNOSIS — R141 Gas pain: Secondary | ICD-10-CM | POA: Diagnosis not present

## 2013-10-23 DIAGNOSIS — R143 Flatulence: Principal | ICD-10-CM

## 2013-10-23 DIAGNOSIS — R142 Eructation: Principal | ICD-10-CM

## 2013-10-28 IMAGING — CR DG HIP COMPLETE 2+V*R*
2 series · 2 of 2 positions shown · non-contrast
Comparison: Right hip radiographs performed 05/17/2012

CLINICAL DATA: Status post fall; right hip pain.

RIGHT HIP - COMPLETE 2+ VIEW

[t hip ap right]
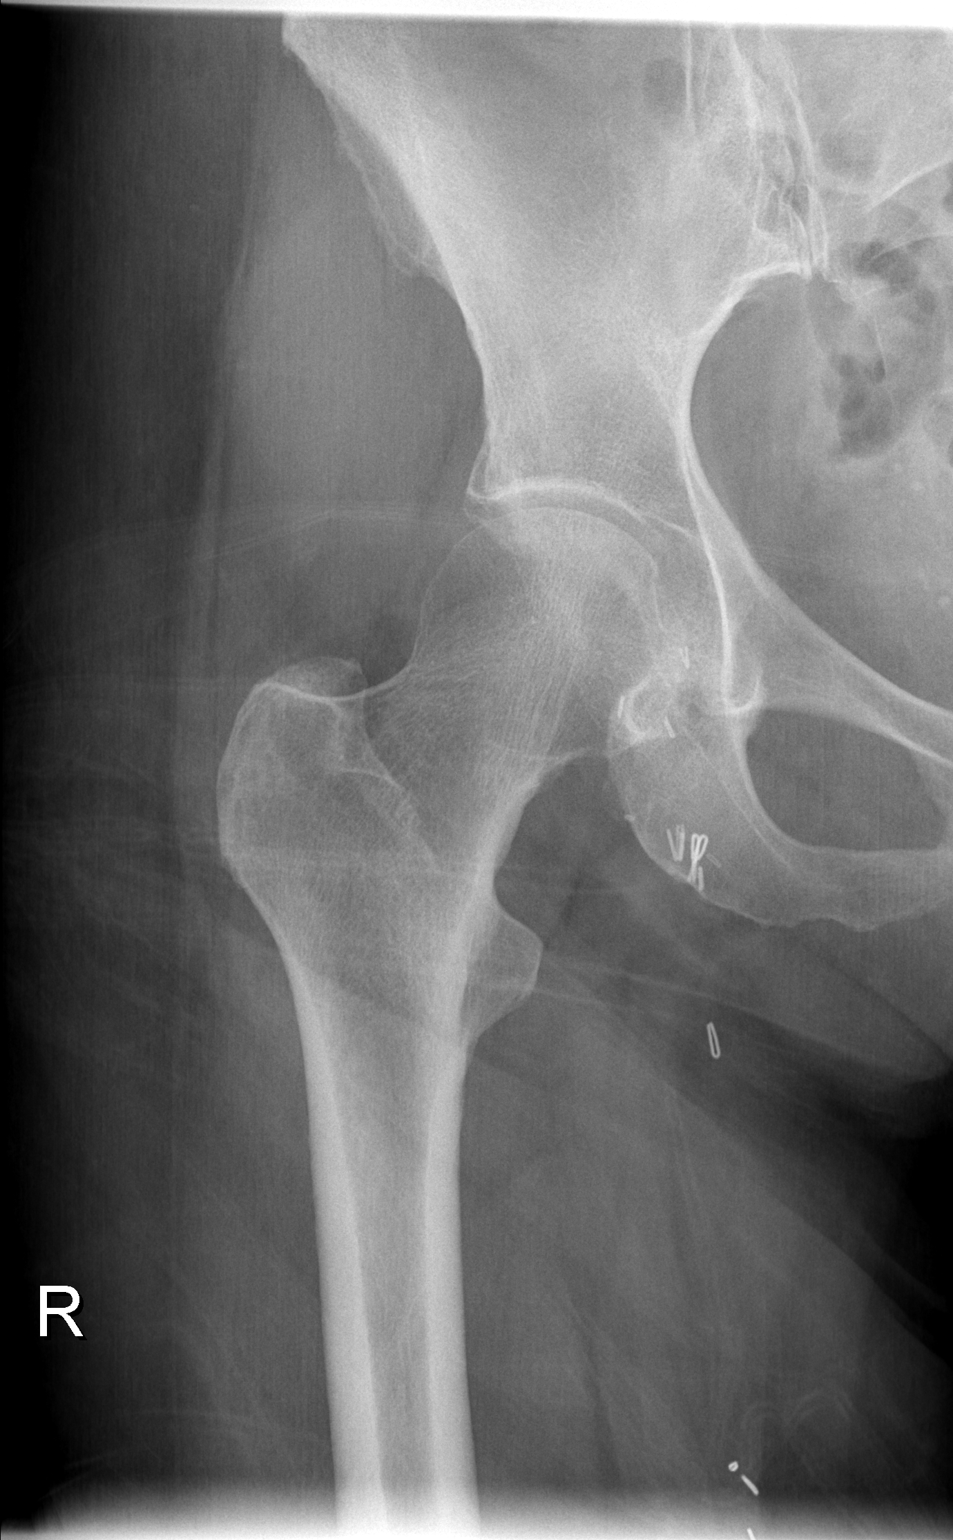

[t hip frog leg right]
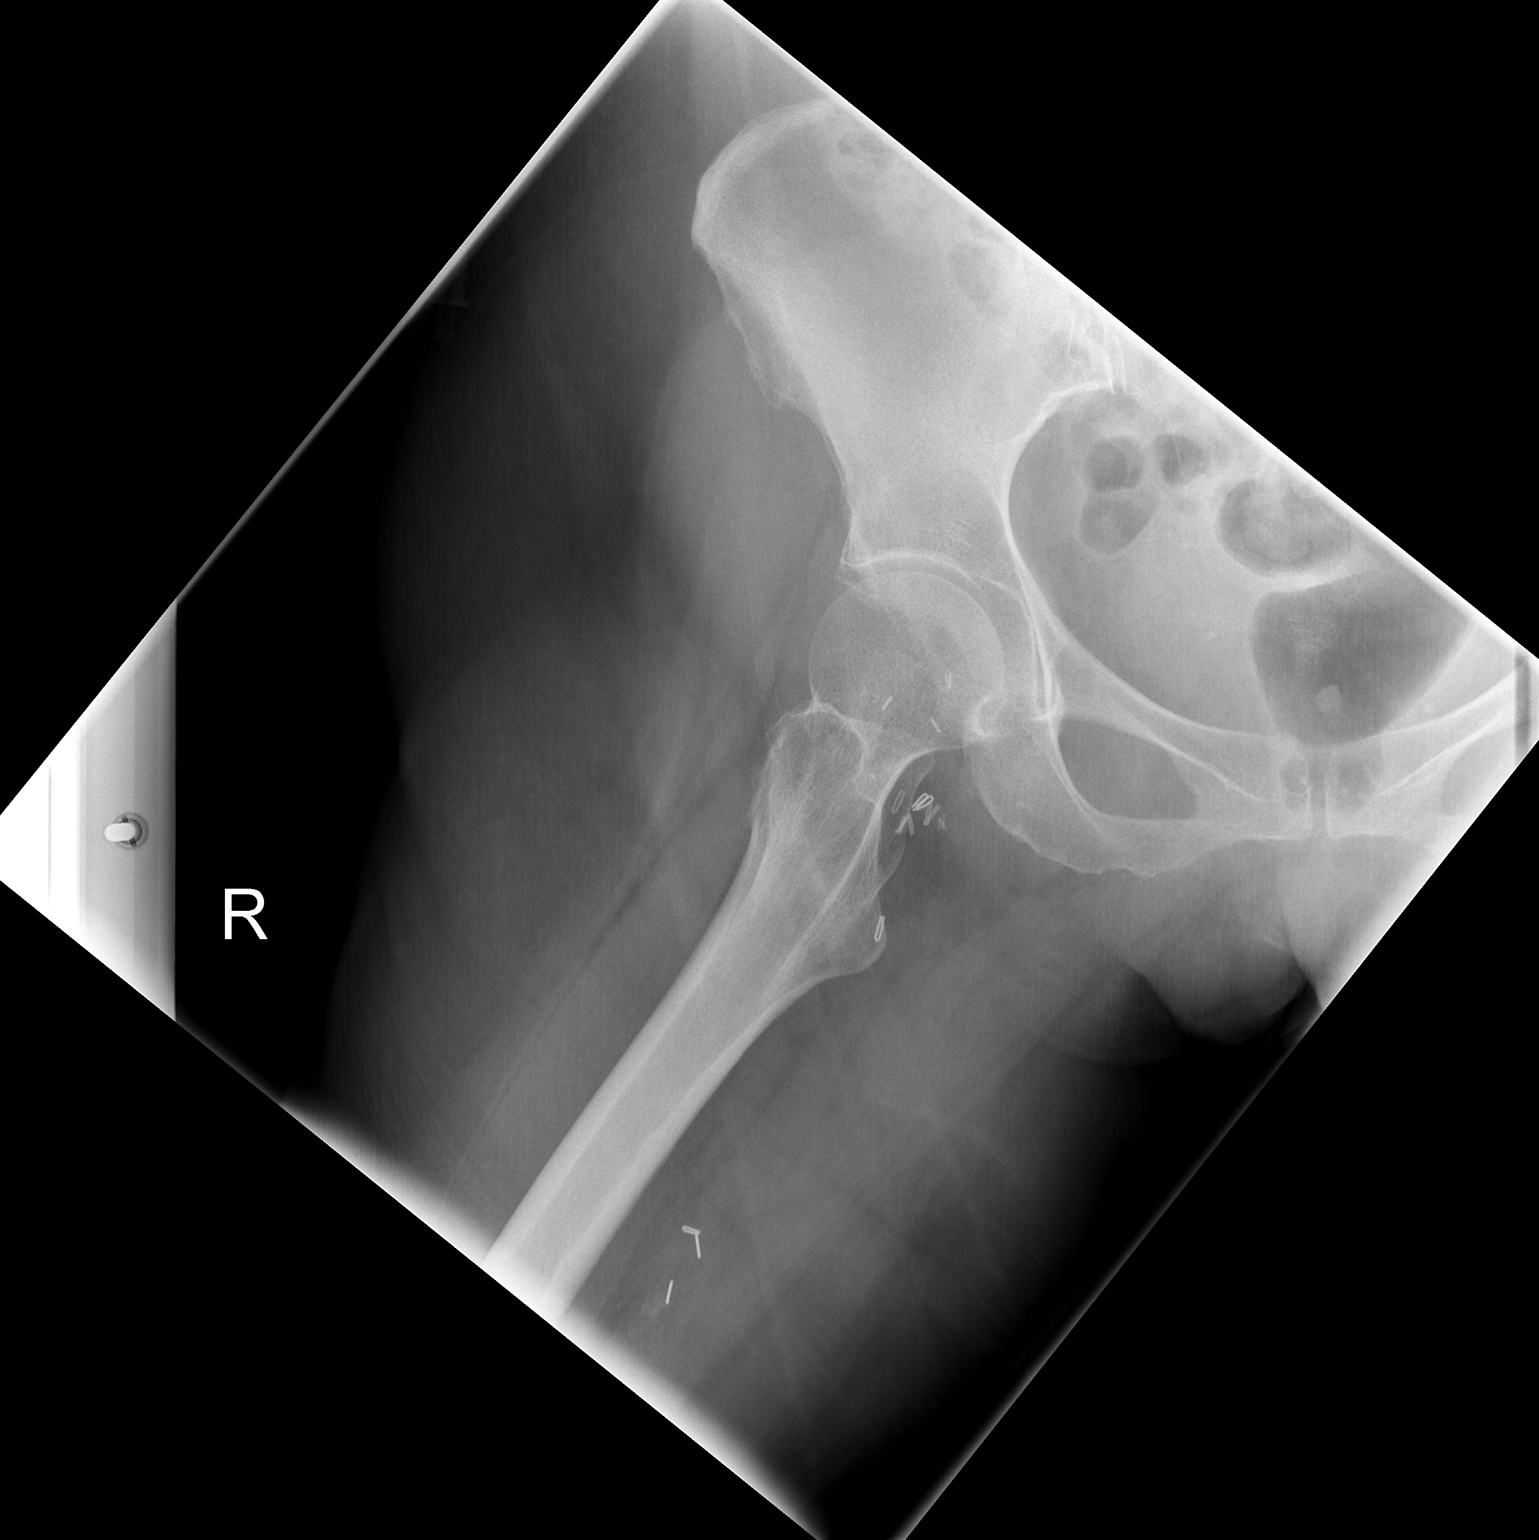

[2 of 2 positions shown; findings below may reference images not displayed]

FINDINGS: There is no evidence of fracture or dislocation.  Both
femoral heads are seated normally within their respective
acetabula.  The proximal right femur appears intact.  No
significant degenerative change is appreciated.  Mild sclerotic
change is seen at the sacroiliac joints.

The visualized bowel gas pattern is grossly unremarkable in
appearance.  Scattered phleboliths are noted within the pelvis.
Postoperative change is noted at both inguinal regions.
IMPRESSION: No evidence of fracture or dislocation.

## 2013-10-28 IMAGING — CR DG CHEST 2V
1 series · 1 of 1 positions shown · non-contrast
Comparison: Chest radiograph performed 05/16/2012

CLINICAL DATA: Status post fall; chest pain.  Weakness and fever.

CHEST - 2 VIEW

[w chest lat]
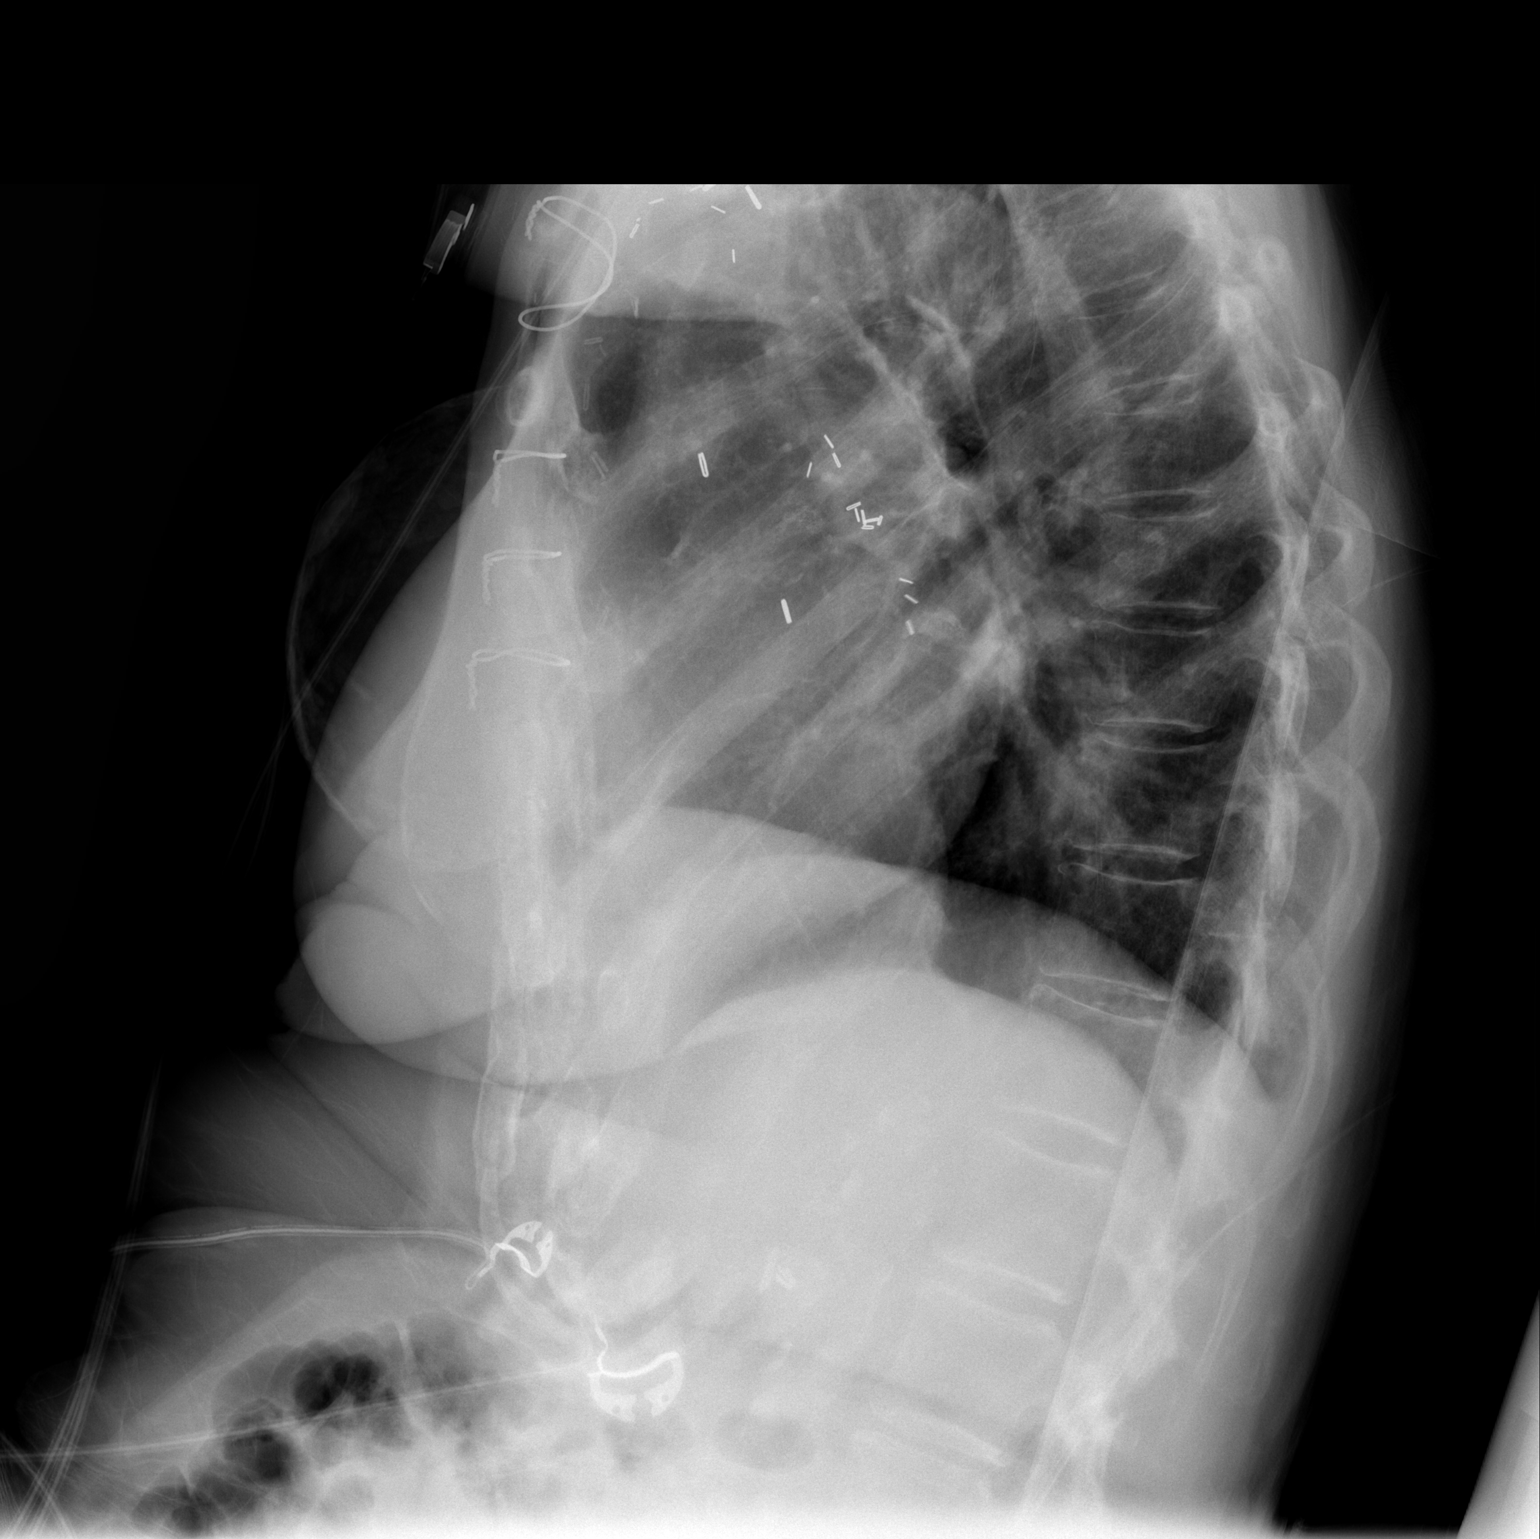

[1 of 1 positions shown; findings below may reference images not displayed]

FINDINGS: The lungs are well-aerated and clear.  There is no
evidence of focal opacification, pleural effusion or pneumothorax.
Mildly increased density at the lung bases is thought to reflect
overlying soft tissues.

The heart is normal in size; the patient is status post median
sternotomy.  Calcification is noted at the aortic arch.  No acute
osseous abnormalities are seen.  Bilateral breast implants are
noted.  Clips are noted within the right upper quadrant, reflecting
prior cholecystectomy.
IMPRESSION: No acute cardiopulmonary process seen.

## 2013-12-18 DIAGNOSIS — E1149 Type 2 diabetes mellitus with other diabetic neurological complication: Secondary | ICD-10-CM | POA: Diagnosis not present

## 2013-12-18 DIAGNOSIS — N183 Chronic kidney disease, stage 3 unspecified: Secondary | ICD-10-CM | POA: Diagnosis not present

## 2013-12-18 DIAGNOSIS — E1129 Type 2 diabetes mellitus with other diabetic kidney complication: Secondary | ICD-10-CM | POA: Diagnosis not present

## 2013-12-18 DIAGNOSIS — F329 Major depressive disorder, single episode, unspecified: Secondary | ICD-10-CM | POA: Diagnosis not present

## 2013-12-18 DIAGNOSIS — E46 Unspecified protein-calorie malnutrition: Secondary | ICD-10-CM | POA: Diagnosis not present

## 2013-12-18 DIAGNOSIS — I1310 Hypertensive heart and chronic kidney disease without heart failure, with stage 1 through stage 4 chronic kidney disease, or unspecified chronic kidney disease: Secondary | ICD-10-CM | POA: Diagnosis not present

## 2013-12-18 DIAGNOSIS — F0390 Unspecified dementia without behavioral disturbance: Secondary | ICD-10-CM | POA: Diagnosis not present

## 2013-12-18 DIAGNOSIS — I2510 Atherosclerotic heart disease of native coronary artery without angina pectoris: Secondary | ICD-10-CM | POA: Diagnosis not present

## 2014-03-04 DIAGNOSIS — Z23 Encounter for immunization: Secondary | ICD-10-CM | POA: Diagnosis not present

## 2014-07-14 DIAGNOSIS — E782 Mixed hyperlipidemia: Secondary | ICD-10-CM | POA: Diagnosis not present

## 2014-07-14 DIAGNOSIS — I2510 Atherosclerotic heart disease of native coronary artery without angina pectoris: Secondary | ICD-10-CM | POA: Diagnosis not present

## 2014-07-14 DIAGNOSIS — F0390 Unspecified dementia without behavioral disturbance: Secondary | ICD-10-CM | POA: Diagnosis not present

## 2014-07-14 DIAGNOSIS — E1122 Type 2 diabetes mellitus with diabetic chronic kidney disease: Secondary | ICD-10-CM | POA: Diagnosis not present

## 2014-07-14 DIAGNOSIS — K219 Gastro-esophageal reflux disease without esophagitis: Secondary | ICD-10-CM | POA: Diagnosis not present

## 2014-07-14 DIAGNOSIS — N183 Chronic kidney disease, stage 3 (moderate): Secondary | ICD-10-CM | POA: Diagnosis not present

## 2014-07-14 DIAGNOSIS — I1310 Hypertensive heart and chronic kidney disease without heart failure, with stage 1 through stage 4 chronic kidney disease, or unspecified chronic kidney disease: Secondary | ICD-10-CM | POA: Diagnosis not present

## 2014-07-14 DIAGNOSIS — F322 Major depressive disorder, single episode, severe without psychotic features: Secondary | ICD-10-CM | POA: Diagnosis not present

## 2014-07-14 DIAGNOSIS — G4700 Insomnia, unspecified: Secondary | ICD-10-CM | POA: Diagnosis not present

## 2014-07-14 DIAGNOSIS — E1142 Type 2 diabetes mellitus with diabetic polyneuropathy: Secondary | ICD-10-CM | POA: Diagnosis not present

## 2014-07-14 DIAGNOSIS — E46 Unspecified protein-calorie malnutrition: Secondary | ICD-10-CM | POA: Diagnosis not present

## 2014-07-21 DIAGNOSIS — E1165 Type 2 diabetes mellitus with hyperglycemia: Secondary | ICD-10-CM | POA: Diagnosis not present

## 2014-07-21 DIAGNOSIS — K219 Gastro-esophageal reflux disease without esophagitis: Secondary | ICD-10-CM | POA: Diagnosis not present

## 2014-07-21 DIAGNOSIS — E46 Unspecified protein-calorie malnutrition: Secondary | ICD-10-CM | POA: Diagnosis not present

## 2014-07-21 DIAGNOSIS — F0390 Unspecified dementia without behavioral disturbance: Secondary | ICD-10-CM | POA: Diagnosis not present

## 2014-07-21 DIAGNOSIS — I2510 Atherosclerotic heart disease of native coronary artery without angina pectoris: Secondary | ICD-10-CM | POA: Diagnosis not present

## 2014-07-21 DIAGNOSIS — F322 Major depressive disorder, single episode, severe without psychotic features: Secondary | ICD-10-CM | POA: Diagnosis not present

## 2014-07-21 DIAGNOSIS — N183 Chronic kidney disease, stage 3 (moderate): Secondary | ICD-10-CM | POA: Diagnosis not present

## 2014-07-21 DIAGNOSIS — E1122 Type 2 diabetes mellitus with diabetic chronic kidney disease: Secondary | ICD-10-CM | POA: Diagnosis not present

## 2014-07-22 DIAGNOSIS — E1165 Type 2 diabetes mellitus with hyperglycemia: Secondary | ICD-10-CM | POA: Diagnosis not present

## 2014-07-22 DIAGNOSIS — E46 Unspecified protein-calorie malnutrition: Secondary | ICD-10-CM | POA: Diagnosis not present

## 2014-07-22 DIAGNOSIS — E1122 Type 2 diabetes mellitus with diabetic chronic kidney disease: Secondary | ICD-10-CM | POA: Diagnosis not present

## 2014-07-22 DIAGNOSIS — N183 Chronic kidney disease, stage 3 (moderate): Secondary | ICD-10-CM | POA: Diagnosis not present

## 2014-07-22 DIAGNOSIS — F322 Major depressive disorder, single episode, severe without psychotic features: Secondary | ICD-10-CM | POA: Diagnosis not present

## 2014-07-22 DIAGNOSIS — I2510 Atherosclerotic heart disease of native coronary artery without angina pectoris: Secondary | ICD-10-CM | POA: Diagnosis not present

## 2014-07-23 DIAGNOSIS — E1122 Type 2 diabetes mellitus with diabetic chronic kidney disease: Secondary | ICD-10-CM | POA: Diagnosis not present

## 2014-07-23 DIAGNOSIS — E46 Unspecified protein-calorie malnutrition: Secondary | ICD-10-CM | POA: Diagnosis not present

## 2014-07-23 DIAGNOSIS — F322 Major depressive disorder, single episode, severe without psychotic features: Secondary | ICD-10-CM | POA: Diagnosis not present

## 2014-07-23 DIAGNOSIS — N183 Chronic kidney disease, stage 3 (moderate): Secondary | ICD-10-CM | POA: Diagnosis not present

## 2014-07-23 DIAGNOSIS — I2510 Atherosclerotic heart disease of native coronary artery without angina pectoris: Secondary | ICD-10-CM | POA: Diagnosis not present

## 2014-07-23 DIAGNOSIS — E1165 Type 2 diabetes mellitus with hyperglycemia: Secondary | ICD-10-CM | POA: Diagnosis not present

## 2014-07-24 DIAGNOSIS — N183 Chronic kidney disease, stage 3 (moderate): Secondary | ICD-10-CM | POA: Diagnosis not present

## 2014-07-24 DIAGNOSIS — I2510 Atherosclerotic heart disease of native coronary artery without angina pectoris: Secondary | ICD-10-CM | POA: Diagnosis not present

## 2014-07-24 DIAGNOSIS — E46 Unspecified protein-calorie malnutrition: Secondary | ICD-10-CM | POA: Diagnosis not present

## 2014-07-24 DIAGNOSIS — E1122 Type 2 diabetes mellitus with diabetic chronic kidney disease: Secondary | ICD-10-CM | POA: Diagnosis not present

## 2014-07-24 DIAGNOSIS — E1165 Type 2 diabetes mellitus with hyperglycemia: Secondary | ICD-10-CM | POA: Diagnosis not present

## 2014-07-24 DIAGNOSIS — F322 Major depressive disorder, single episode, severe without psychotic features: Secondary | ICD-10-CM | POA: Diagnosis not present

## 2014-07-25 DIAGNOSIS — E1122 Type 2 diabetes mellitus with diabetic chronic kidney disease: Secondary | ICD-10-CM | POA: Diagnosis not present

## 2014-07-25 DIAGNOSIS — F322 Major depressive disorder, single episode, severe without psychotic features: Secondary | ICD-10-CM | POA: Diagnosis not present

## 2014-07-25 DIAGNOSIS — I2510 Atherosclerotic heart disease of native coronary artery without angina pectoris: Secondary | ICD-10-CM | POA: Diagnosis not present

## 2014-07-25 DIAGNOSIS — N183 Chronic kidney disease, stage 3 (moderate): Secondary | ICD-10-CM | POA: Diagnosis not present

## 2014-07-25 DIAGNOSIS — E1165 Type 2 diabetes mellitus with hyperglycemia: Secondary | ICD-10-CM | POA: Diagnosis not present

## 2014-07-25 DIAGNOSIS — E46 Unspecified protein-calorie malnutrition: Secondary | ICD-10-CM | POA: Diagnosis not present

## 2014-07-27 DIAGNOSIS — E1165 Type 2 diabetes mellitus with hyperglycemia: Secondary | ICD-10-CM | POA: Diagnosis not present

## 2014-07-27 DIAGNOSIS — E46 Unspecified protein-calorie malnutrition: Secondary | ICD-10-CM | POA: Diagnosis not present

## 2014-07-27 DIAGNOSIS — F322 Major depressive disorder, single episode, severe without psychotic features: Secondary | ICD-10-CM | POA: Diagnosis not present

## 2014-07-27 DIAGNOSIS — E1122 Type 2 diabetes mellitus with diabetic chronic kidney disease: Secondary | ICD-10-CM | POA: Diagnosis not present

## 2014-07-27 DIAGNOSIS — N183 Chronic kidney disease, stage 3 (moderate): Secondary | ICD-10-CM | POA: Diagnosis not present

## 2014-07-27 DIAGNOSIS — I2510 Atherosclerotic heart disease of native coronary artery without angina pectoris: Secondary | ICD-10-CM | POA: Diagnosis not present

## 2014-07-28 DIAGNOSIS — E1165 Type 2 diabetes mellitus with hyperglycemia: Secondary | ICD-10-CM | POA: Diagnosis not present

## 2014-07-28 DIAGNOSIS — F322 Major depressive disorder, single episode, severe without psychotic features: Secondary | ICD-10-CM | POA: Diagnosis not present

## 2014-07-28 DIAGNOSIS — N183 Chronic kidney disease, stage 3 (moderate): Secondary | ICD-10-CM | POA: Diagnosis not present

## 2014-07-28 DIAGNOSIS — E46 Unspecified protein-calorie malnutrition: Secondary | ICD-10-CM | POA: Diagnosis not present

## 2014-07-28 DIAGNOSIS — I2510 Atherosclerotic heart disease of native coronary artery without angina pectoris: Secondary | ICD-10-CM | POA: Diagnosis not present

## 2014-07-28 DIAGNOSIS — E1122 Type 2 diabetes mellitus with diabetic chronic kidney disease: Secondary | ICD-10-CM | POA: Diagnosis not present

## 2014-07-31 DIAGNOSIS — N183 Chronic kidney disease, stage 3 (moderate): Secondary | ICD-10-CM | POA: Diagnosis not present

## 2014-07-31 DIAGNOSIS — E1122 Type 2 diabetes mellitus with diabetic chronic kidney disease: Secondary | ICD-10-CM | POA: Diagnosis not present

## 2014-07-31 DIAGNOSIS — F322 Major depressive disorder, single episode, severe without psychotic features: Secondary | ICD-10-CM | POA: Diagnosis not present

## 2014-07-31 DIAGNOSIS — E1165 Type 2 diabetes mellitus with hyperglycemia: Secondary | ICD-10-CM | POA: Diagnosis not present

## 2014-07-31 DIAGNOSIS — I2510 Atherosclerotic heart disease of native coronary artery without angina pectoris: Secondary | ICD-10-CM | POA: Diagnosis not present

## 2014-07-31 DIAGNOSIS — E46 Unspecified protein-calorie malnutrition: Secondary | ICD-10-CM | POA: Diagnosis not present

## 2014-08-03 DIAGNOSIS — E46 Unspecified protein-calorie malnutrition: Secondary | ICD-10-CM | POA: Diagnosis not present

## 2014-08-03 DIAGNOSIS — E1122 Type 2 diabetes mellitus with diabetic chronic kidney disease: Secondary | ICD-10-CM | POA: Diagnosis not present

## 2014-08-03 DIAGNOSIS — F322 Major depressive disorder, single episode, severe without psychotic features: Secondary | ICD-10-CM | POA: Diagnosis not present

## 2014-08-03 DIAGNOSIS — I2510 Atherosclerotic heart disease of native coronary artery without angina pectoris: Secondary | ICD-10-CM | POA: Diagnosis not present

## 2014-08-03 DIAGNOSIS — N183 Chronic kidney disease, stage 3 (moderate): Secondary | ICD-10-CM | POA: Diagnosis not present

## 2014-08-03 DIAGNOSIS — E1165 Type 2 diabetes mellitus with hyperglycemia: Secondary | ICD-10-CM | POA: Diagnosis not present

## 2014-08-05 DIAGNOSIS — I2510 Atherosclerotic heart disease of native coronary artery without angina pectoris: Secondary | ICD-10-CM | POA: Diagnosis not present

## 2014-08-05 DIAGNOSIS — E1122 Type 2 diabetes mellitus with diabetic chronic kidney disease: Secondary | ICD-10-CM | POA: Diagnosis not present

## 2014-08-05 DIAGNOSIS — F322 Major depressive disorder, single episode, severe without psychotic features: Secondary | ICD-10-CM | POA: Diagnosis not present

## 2014-08-05 DIAGNOSIS — E46 Unspecified protein-calorie malnutrition: Secondary | ICD-10-CM | POA: Diagnosis not present

## 2014-08-05 DIAGNOSIS — E1165 Type 2 diabetes mellitus with hyperglycemia: Secondary | ICD-10-CM | POA: Diagnosis not present

## 2014-08-05 DIAGNOSIS — N183 Chronic kidney disease, stage 3 (moderate): Secondary | ICD-10-CM | POA: Diagnosis not present

## 2014-08-06 DIAGNOSIS — E1165 Type 2 diabetes mellitus with hyperglycemia: Secondary | ICD-10-CM | POA: Diagnosis not present

## 2014-08-06 DIAGNOSIS — E46 Unspecified protein-calorie malnutrition: Secondary | ICD-10-CM | POA: Diagnosis not present

## 2014-08-06 DIAGNOSIS — E1122 Type 2 diabetes mellitus with diabetic chronic kidney disease: Secondary | ICD-10-CM | POA: Diagnosis not present

## 2014-08-06 DIAGNOSIS — I2510 Atherosclerotic heart disease of native coronary artery without angina pectoris: Secondary | ICD-10-CM | POA: Diagnosis not present

## 2014-08-06 DIAGNOSIS — N183 Chronic kidney disease, stage 3 (moderate): Secondary | ICD-10-CM | POA: Diagnosis not present

## 2014-08-06 DIAGNOSIS — F322 Major depressive disorder, single episode, severe without psychotic features: Secondary | ICD-10-CM | POA: Diagnosis not present

## 2014-08-07 DIAGNOSIS — E1165 Type 2 diabetes mellitus with hyperglycemia: Secondary | ICD-10-CM | POA: Diagnosis not present

## 2014-08-07 DIAGNOSIS — E1122 Type 2 diabetes mellitus with diabetic chronic kidney disease: Secondary | ICD-10-CM | POA: Diagnosis not present

## 2014-08-07 DIAGNOSIS — E46 Unspecified protein-calorie malnutrition: Secondary | ICD-10-CM | POA: Diagnosis not present

## 2014-08-07 DIAGNOSIS — F322 Major depressive disorder, single episode, severe without psychotic features: Secondary | ICD-10-CM | POA: Diagnosis not present

## 2014-08-07 DIAGNOSIS — N183 Chronic kidney disease, stage 3 (moderate): Secondary | ICD-10-CM | POA: Diagnosis not present

## 2014-08-07 DIAGNOSIS — I2510 Atherosclerotic heart disease of native coronary artery without angina pectoris: Secondary | ICD-10-CM | POA: Diagnosis not present

## 2014-08-10 DIAGNOSIS — I2510 Atherosclerotic heart disease of native coronary artery without angina pectoris: Secondary | ICD-10-CM | POA: Diagnosis not present

## 2014-08-10 DIAGNOSIS — F322 Major depressive disorder, single episode, severe without psychotic features: Secondary | ICD-10-CM | POA: Diagnosis not present

## 2014-08-10 DIAGNOSIS — E46 Unspecified protein-calorie malnutrition: Secondary | ICD-10-CM | POA: Diagnosis not present

## 2014-08-10 DIAGNOSIS — E1122 Type 2 diabetes mellitus with diabetic chronic kidney disease: Secondary | ICD-10-CM | POA: Diagnosis not present

## 2014-08-10 DIAGNOSIS — E1165 Type 2 diabetes mellitus with hyperglycemia: Secondary | ICD-10-CM | POA: Diagnosis not present

## 2014-08-10 DIAGNOSIS — N183 Chronic kidney disease, stage 3 (moderate): Secondary | ICD-10-CM | POA: Diagnosis not present

## 2014-08-12 DIAGNOSIS — E1165 Type 2 diabetes mellitus with hyperglycemia: Secondary | ICD-10-CM | POA: Diagnosis not present

## 2014-08-12 DIAGNOSIS — F322 Major depressive disorder, single episode, severe without psychotic features: Secondary | ICD-10-CM | POA: Diagnosis not present

## 2014-08-12 DIAGNOSIS — E1122 Type 2 diabetes mellitus with diabetic chronic kidney disease: Secondary | ICD-10-CM | POA: Diagnosis not present

## 2014-08-12 DIAGNOSIS — I2510 Atherosclerotic heart disease of native coronary artery without angina pectoris: Secondary | ICD-10-CM | POA: Diagnosis not present

## 2014-08-12 DIAGNOSIS — N183 Chronic kidney disease, stage 3 (moderate): Secondary | ICD-10-CM | POA: Diagnosis not present

## 2014-08-12 DIAGNOSIS — E46 Unspecified protein-calorie malnutrition: Secondary | ICD-10-CM | POA: Diagnosis not present

## 2014-08-13 DIAGNOSIS — E46 Unspecified protein-calorie malnutrition: Secondary | ICD-10-CM | POA: Diagnosis not present

## 2014-08-13 DIAGNOSIS — E1165 Type 2 diabetes mellitus with hyperglycemia: Secondary | ICD-10-CM | POA: Diagnosis not present

## 2014-08-13 DIAGNOSIS — E1122 Type 2 diabetes mellitus with diabetic chronic kidney disease: Secondary | ICD-10-CM | POA: Diagnosis not present

## 2014-08-13 DIAGNOSIS — I2510 Atherosclerotic heart disease of native coronary artery without angina pectoris: Secondary | ICD-10-CM | POA: Diagnosis not present

## 2014-08-13 DIAGNOSIS — N183 Chronic kidney disease, stage 3 (moderate): Secondary | ICD-10-CM | POA: Diagnosis not present

## 2014-08-13 DIAGNOSIS — F322 Major depressive disorder, single episode, severe without psychotic features: Secondary | ICD-10-CM | POA: Diagnosis not present

## 2014-08-17 DIAGNOSIS — N183 Chronic kidney disease, stage 3 (moderate): Secondary | ICD-10-CM | POA: Diagnosis not present

## 2014-08-17 DIAGNOSIS — E46 Unspecified protein-calorie malnutrition: Secondary | ICD-10-CM | POA: Diagnosis not present

## 2014-08-17 DIAGNOSIS — E1165 Type 2 diabetes mellitus with hyperglycemia: Secondary | ICD-10-CM | POA: Diagnosis not present

## 2014-08-17 DIAGNOSIS — I2510 Atherosclerotic heart disease of native coronary artery without angina pectoris: Secondary | ICD-10-CM | POA: Diagnosis not present

## 2014-08-17 DIAGNOSIS — F322 Major depressive disorder, single episode, severe without psychotic features: Secondary | ICD-10-CM | POA: Diagnosis not present

## 2014-08-17 DIAGNOSIS — E1122 Type 2 diabetes mellitus with diabetic chronic kidney disease: Secondary | ICD-10-CM | POA: Diagnosis not present

## 2014-08-18 DIAGNOSIS — N183 Chronic kidney disease, stage 3 (moderate): Secondary | ICD-10-CM | POA: Diagnosis not present

## 2014-08-18 DIAGNOSIS — E1122 Type 2 diabetes mellitus with diabetic chronic kidney disease: Secondary | ICD-10-CM | POA: Diagnosis not present

## 2014-08-18 DIAGNOSIS — I2510 Atherosclerotic heart disease of native coronary artery without angina pectoris: Secondary | ICD-10-CM | POA: Diagnosis not present

## 2014-08-18 DIAGNOSIS — F322 Major depressive disorder, single episode, severe without psychotic features: Secondary | ICD-10-CM | POA: Diagnosis not present

## 2014-08-18 DIAGNOSIS — E1165 Type 2 diabetes mellitus with hyperglycemia: Secondary | ICD-10-CM | POA: Diagnosis not present

## 2014-08-18 DIAGNOSIS — E46 Unspecified protein-calorie malnutrition: Secondary | ICD-10-CM | POA: Diagnosis not present

## 2014-08-20 DIAGNOSIS — E1122 Type 2 diabetes mellitus with diabetic chronic kidney disease: Secondary | ICD-10-CM | POA: Diagnosis not present

## 2014-08-20 DIAGNOSIS — E46 Unspecified protein-calorie malnutrition: Secondary | ICD-10-CM | POA: Diagnosis not present

## 2014-08-20 DIAGNOSIS — I2510 Atherosclerotic heart disease of native coronary artery without angina pectoris: Secondary | ICD-10-CM | POA: Diagnosis not present

## 2014-08-20 DIAGNOSIS — F322 Major depressive disorder, single episode, severe without psychotic features: Secondary | ICD-10-CM | POA: Diagnosis not present

## 2014-08-20 DIAGNOSIS — N183 Chronic kidney disease, stage 3 (moderate): Secondary | ICD-10-CM | POA: Diagnosis not present

## 2014-08-20 DIAGNOSIS — E1165 Type 2 diabetes mellitus with hyperglycemia: Secondary | ICD-10-CM | POA: Diagnosis not present

## 2014-08-24 DIAGNOSIS — E1122 Type 2 diabetes mellitus with diabetic chronic kidney disease: Secondary | ICD-10-CM | POA: Diagnosis not present

## 2014-08-24 DIAGNOSIS — N183 Chronic kidney disease, stage 3 (moderate): Secondary | ICD-10-CM | POA: Diagnosis not present

## 2014-08-24 DIAGNOSIS — I2510 Atherosclerotic heart disease of native coronary artery without angina pectoris: Secondary | ICD-10-CM | POA: Diagnosis not present

## 2014-08-24 DIAGNOSIS — F322 Major depressive disorder, single episode, severe without psychotic features: Secondary | ICD-10-CM | POA: Diagnosis not present

## 2014-08-24 DIAGNOSIS — E1165 Type 2 diabetes mellitus with hyperglycemia: Secondary | ICD-10-CM | POA: Diagnosis not present

## 2014-08-24 DIAGNOSIS — E46 Unspecified protein-calorie malnutrition: Secondary | ICD-10-CM | POA: Diagnosis not present

## 2014-08-26 DIAGNOSIS — E1122 Type 2 diabetes mellitus with diabetic chronic kidney disease: Secondary | ICD-10-CM | POA: Diagnosis not present

## 2014-08-26 DIAGNOSIS — F322 Major depressive disorder, single episode, severe without psychotic features: Secondary | ICD-10-CM | POA: Diagnosis not present

## 2014-08-26 DIAGNOSIS — E46 Unspecified protein-calorie malnutrition: Secondary | ICD-10-CM | POA: Diagnosis not present

## 2014-08-26 DIAGNOSIS — N183 Chronic kidney disease, stage 3 (moderate): Secondary | ICD-10-CM | POA: Diagnosis not present

## 2014-08-26 DIAGNOSIS — E1165 Type 2 diabetes mellitus with hyperglycemia: Secondary | ICD-10-CM | POA: Diagnosis not present

## 2014-08-26 DIAGNOSIS — I2510 Atherosclerotic heart disease of native coronary artery without angina pectoris: Secondary | ICD-10-CM | POA: Diagnosis not present

## 2014-09-07 DIAGNOSIS — E1159 Type 2 diabetes mellitus with other circulatory complications: Secondary | ICD-10-CM | POA: Diagnosis not present

## 2014-09-07 DIAGNOSIS — E785 Hyperlipidemia, unspecified: Secondary | ICD-10-CM | POA: Diagnosis not present

## 2014-09-07 DIAGNOSIS — F341 Dysthymic disorder: Secondary | ICD-10-CM | POA: Diagnosis not present

## 2014-09-07 DIAGNOSIS — F411 Generalized anxiety disorder: Secondary | ICD-10-CM | POA: Diagnosis not present

## 2014-09-07 DIAGNOSIS — I5032 Chronic diastolic (congestive) heart failure: Secondary | ICD-10-CM | POA: Diagnosis not present

## 2014-09-07 DIAGNOSIS — I359 Nonrheumatic aortic valve disorder, unspecified: Secondary | ICD-10-CM | POA: Diagnosis not present

## 2014-09-07 DIAGNOSIS — I25810 Atherosclerosis of coronary artery bypass graft(s) without angina pectoris: Secondary | ICD-10-CM | POA: Diagnosis not present

## 2014-09-07 DIAGNOSIS — I119 Hypertensive heart disease without heart failure: Secondary | ICD-10-CM | POA: Diagnosis not present

## 2014-09-07 DIAGNOSIS — Z951 Presence of aortocoronary bypass graft: Secondary | ICD-10-CM | POA: Diagnosis not present

## 2014-09-07 DIAGNOSIS — I2510 Atherosclerotic heart disease of native coronary artery without angina pectoris: Secondary | ICD-10-CM | POA: Diagnosis not present

## 2014-09-07 DIAGNOSIS — I739 Peripheral vascular disease, unspecified: Secondary | ICD-10-CM | POA: Diagnosis not present

## 2014-09-14 DIAGNOSIS — E46 Unspecified protein-calorie malnutrition: Secondary | ICD-10-CM | POA: Diagnosis not present

## 2014-09-14 DIAGNOSIS — F322 Major depressive disorder, single episode, severe without psychotic features: Secondary | ICD-10-CM | POA: Diagnosis not present

## 2014-09-14 DIAGNOSIS — E1165 Type 2 diabetes mellitus with hyperglycemia: Secondary | ICD-10-CM | POA: Diagnosis not present

## 2014-09-14 DIAGNOSIS — E1122 Type 2 diabetes mellitus with diabetic chronic kidney disease: Secondary | ICD-10-CM | POA: Diagnosis not present

## 2014-09-14 DIAGNOSIS — N183 Chronic kidney disease, stage 3 (moderate): Secondary | ICD-10-CM | POA: Diagnosis not present

## 2014-09-14 DIAGNOSIS — I2510 Atherosclerotic heart disease of native coronary artery without angina pectoris: Secondary | ICD-10-CM | POA: Diagnosis not present

## 2014-10-12 DIAGNOSIS — E46 Unspecified protein-calorie malnutrition: Secondary | ICD-10-CM | POA: Diagnosis not present

## 2014-10-12 DIAGNOSIS — G4700 Insomnia, unspecified: Secondary | ICD-10-CM | POA: Diagnosis not present

## 2014-10-12 DIAGNOSIS — E1142 Type 2 diabetes mellitus with diabetic polyneuropathy: Secondary | ICD-10-CM | POA: Diagnosis not present

## 2014-10-12 DIAGNOSIS — K219 Gastro-esophageal reflux disease without esophagitis: Secondary | ICD-10-CM | POA: Diagnosis not present

## 2014-10-12 DIAGNOSIS — F0390 Unspecified dementia without behavioral disturbance: Secondary | ICD-10-CM | POA: Diagnosis not present

## 2014-10-12 DIAGNOSIS — I1310 Hypertensive heart and chronic kidney disease without heart failure, with stage 1 through stage 4 chronic kidney disease, or unspecified chronic kidney disease: Secondary | ICD-10-CM | POA: Diagnosis not present

## 2014-10-12 DIAGNOSIS — E782 Mixed hyperlipidemia: Secondary | ICD-10-CM | POA: Diagnosis not present

## 2014-10-12 DIAGNOSIS — F322 Major depressive disorder, single episode, severe without psychotic features: Secondary | ICD-10-CM | POA: Diagnosis not present

## 2014-10-12 DIAGNOSIS — N183 Chronic kidney disease, stage 3 (moderate): Secondary | ICD-10-CM | POA: Diagnosis not present

## 2014-10-12 DIAGNOSIS — I2510 Atherosclerotic heart disease of native coronary artery without angina pectoris: Secondary | ICD-10-CM | POA: Diagnosis not present

## 2014-10-12 DIAGNOSIS — E1122 Type 2 diabetes mellitus with diabetic chronic kidney disease: Secondary | ICD-10-CM | POA: Diagnosis not present

## 2014-11-13 DIAGNOSIS — I1310 Hypertensive heart and chronic kidney disease without heart failure, with stage 1 through stage 4 chronic kidney disease, or unspecified chronic kidney disease: Secondary | ICD-10-CM | POA: Diagnosis not present

## 2014-11-13 DIAGNOSIS — N183 Chronic kidney disease, stage 3 (moderate): Secondary | ICD-10-CM | POA: Diagnosis not present

## 2014-11-13 DIAGNOSIS — F0390 Unspecified dementia without behavioral disturbance: Secondary | ICD-10-CM | POA: Diagnosis not present

## 2014-12-18 DIAGNOSIS — E46 Unspecified protein-calorie malnutrition: Secondary | ICD-10-CM | POA: Diagnosis not present

## 2014-12-18 DIAGNOSIS — F0390 Unspecified dementia without behavioral disturbance: Secondary | ICD-10-CM | POA: Diagnosis not present

## 2014-12-18 DIAGNOSIS — I1310 Hypertensive heart and chronic kidney disease without heart failure, with stage 1 through stage 4 chronic kidney disease, or unspecified chronic kidney disease: Secondary | ICD-10-CM | POA: Diagnosis not present

## 2015-04-07 DIAGNOSIS — E782 Mixed hyperlipidemia: Secondary | ICD-10-CM | POA: Diagnosis not present

## 2015-04-07 DIAGNOSIS — F0390 Unspecified dementia without behavioral disturbance: Secondary | ICD-10-CM | POA: Diagnosis not present

## 2015-04-07 DIAGNOSIS — Z23 Encounter for immunization: Secondary | ICD-10-CM | POA: Diagnosis not present

## 2015-04-07 DIAGNOSIS — E46 Unspecified protein-calorie malnutrition: Secondary | ICD-10-CM | POA: Diagnosis not present

## 2015-04-07 DIAGNOSIS — Z79899 Other long term (current) drug therapy: Secondary | ICD-10-CM | POA: Diagnosis not present

## 2015-04-07 DIAGNOSIS — I1310 Hypertensive heart and chronic kidney disease without heart failure, with stage 1 through stage 4 chronic kidney disease, or unspecified chronic kidney disease: Secondary | ICD-10-CM | POA: Diagnosis not present

## 2015-04-07 DIAGNOSIS — N183 Chronic kidney disease, stage 3 (moderate): Secondary | ICD-10-CM | POA: Diagnosis not present

## 2015-04-07 DIAGNOSIS — E1122 Type 2 diabetes mellitus with diabetic chronic kidney disease: Secondary | ICD-10-CM | POA: Diagnosis not present

## 2015-04-11 DIAGNOSIS — Z23 Encounter for immunization: Secondary | ICD-10-CM | POA: Diagnosis not present

## 2015-05-24 DIAGNOSIS — H66002 Acute suppurative otitis media without spontaneous rupture of ear drum, left ear: Secondary | ICD-10-CM | POA: Diagnosis not present

## 2015-05-24 DIAGNOSIS — H60392 Other infective otitis externa, left ear: Secondary | ICD-10-CM | POA: Diagnosis not present

## 2015-05-27 DIAGNOSIS — H6120 Impacted cerumen, unspecified ear: Secondary | ICD-10-CM | POA: Diagnosis not present

## 2015-05-27 DIAGNOSIS — H9209 Otalgia, unspecified ear: Secondary | ICD-10-CM | POA: Diagnosis not present

## 2015-07-14 DIAGNOSIS — E1122 Type 2 diabetes mellitus with diabetic chronic kidney disease: Secondary | ICD-10-CM | POA: Diagnosis not present

## 2015-07-14 DIAGNOSIS — I1310 Hypertensive heart and chronic kidney disease without heart failure, with stage 1 through stage 4 chronic kidney disease, or unspecified chronic kidney disease: Secondary | ICD-10-CM | POA: Diagnosis not present

## 2015-07-14 DIAGNOSIS — E1142 Type 2 diabetes mellitus with diabetic polyneuropathy: Secondary | ICD-10-CM | POA: Diagnosis not present

## 2015-07-14 DIAGNOSIS — N183 Chronic kidney disease, stage 3 (moderate): Secondary | ICD-10-CM | POA: Diagnosis not present

## 2015-07-14 DIAGNOSIS — E782 Mixed hyperlipidemia: Secondary | ICD-10-CM | POA: Diagnosis not present

## 2015-07-14 DIAGNOSIS — F322 Major depressive disorder, single episode, severe without psychotic features: Secondary | ICD-10-CM | POA: Diagnosis not present

## 2015-07-14 DIAGNOSIS — K219 Gastro-esophageal reflux disease without esophagitis: Secondary | ICD-10-CM | POA: Diagnosis not present

## 2015-07-14 DIAGNOSIS — I2510 Atherosclerotic heart disease of native coronary artery without angina pectoris: Secondary | ICD-10-CM | POA: Diagnosis not present

## 2015-07-14 DIAGNOSIS — G4700 Insomnia, unspecified: Secondary | ICD-10-CM | POA: Diagnosis not present

## 2015-07-14 DIAGNOSIS — F0390 Unspecified dementia without behavioral disturbance: Secondary | ICD-10-CM | POA: Diagnosis not present

## 2015-07-14 DIAGNOSIS — E46 Unspecified protein-calorie malnutrition: Secondary | ICD-10-CM | POA: Diagnosis not present

## 2015-07-23 DIAGNOSIS — N179 Acute kidney failure, unspecified: Secondary | ICD-10-CM | POA: Diagnosis not present

## 2015-07-30 DIAGNOSIS — N179 Acute kidney failure, unspecified: Secondary | ICD-10-CM | POA: Diagnosis not present

## 2015-07-30 DIAGNOSIS — N184 Chronic kidney disease, stage 4 (severe): Secondary | ICD-10-CM | POA: Diagnosis not present

## 2015-08-13 DIAGNOSIS — N179 Acute kidney failure, unspecified: Secondary | ICD-10-CM | POA: Diagnosis not present

## 2015-09-22 DIAGNOSIS — N179 Acute kidney failure, unspecified: Secondary | ICD-10-CM | POA: Diagnosis not present

## 2015-09-22 DIAGNOSIS — N183 Chronic kidney disease, stage 3 (moderate): Secondary | ICD-10-CM | POA: Diagnosis not present

## 2015-10-13 DIAGNOSIS — I1310 Hypertensive heart and chronic kidney disease without heart failure, with stage 1 through stage 4 chronic kidney disease, or unspecified chronic kidney disease: Secondary | ICD-10-CM | POA: Diagnosis not present

## 2015-10-13 DIAGNOSIS — E1122 Type 2 diabetes mellitus with diabetic chronic kidney disease: Secondary | ICD-10-CM | POA: Diagnosis not present

## 2015-10-13 DIAGNOSIS — E782 Mixed hyperlipidemia: Secondary | ICD-10-CM | POA: Diagnosis not present

## 2015-10-13 DIAGNOSIS — E1142 Type 2 diabetes mellitus with diabetic polyneuropathy: Secondary | ICD-10-CM | POA: Diagnosis not present

## 2015-10-13 DIAGNOSIS — I2510 Atherosclerotic heart disease of native coronary artery without angina pectoris: Secondary | ICD-10-CM | POA: Diagnosis not present

## 2015-10-13 DIAGNOSIS — K219 Gastro-esophageal reflux disease without esophagitis: Secondary | ICD-10-CM | POA: Diagnosis not present

## 2015-10-13 DIAGNOSIS — F0390 Unspecified dementia without behavioral disturbance: Secondary | ICD-10-CM | POA: Diagnosis not present

## 2015-10-13 DIAGNOSIS — E46 Unspecified protein-calorie malnutrition: Secondary | ICD-10-CM | POA: Diagnosis not present

## 2015-10-13 DIAGNOSIS — G4700 Insomnia, unspecified: Secondary | ICD-10-CM | POA: Diagnosis not present

## 2015-10-13 DIAGNOSIS — N183 Chronic kidney disease, stage 3 (moderate): Secondary | ICD-10-CM | POA: Diagnosis not present

## 2015-10-13 DIAGNOSIS — F322 Major depressive disorder, single episode, severe without psychotic features: Secondary | ICD-10-CM | POA: Diagnosis not present

## 2016-01-14 DIAGNOSIS — I1310 Hypertensive heart and chronic kidney disease without heart failure, with stage 1 through stage 4 chronic kidney disease, or unspecified chronic kidney disease: Secondary | ICD-10-CM | POA: Diagnosis not present

## 2016-01-14 DIAGNOSIS — F0390 Unspecified dementia without behavioral disturbance: Secondary | ICD-10-CM | POA: Diagnosis not present

## 2016-01-14 DIAGNOSIS — E782 Mixed hyperlipidemia: Secondary | ICD-10-CM | POA: Diagnosis not present

## 2016-01-14 DIAGNOSIS — N183 Chronic kidney disease, stage 3 (moderate): Secondary | ICD-10-CM | POA: Diagnosis not present

## 2016-01-14 DIAGNOSIS — I2510 Atherosclerotic heart disease of native coronary artery without angina pectoris: Secondary | ICD-10-CM | POA: Diagnosis not present

## 2016-01-14 DIAGNOSIS — E46 Unspecified protein-calorie malnutrition: Secondary | ICD-10-CM | POA: Diagnosis not present

## 2016-01-14 DIAGNOSIS — Z23 Encounter for immunization: Secondary | ICD-10-CM | POA: Diagnosis not present

## 2016-01-14 DIAGNOSIS — E1122 Type 2 diabetes mellitus with diabetic chronic kidney disease: Secondary | ICD-10-CM | POA: Diagnosis not present

## 2016-01-21 ENCOUNTER — Encounter (HOSPITAL_COMMUNITY): Payer: Self-pay | Admitting: *Deleted

## 2016-01-21 ENCOUNTER — Emergency Department (HOSPITAL_COMMUNITY): Payer: Medicare Other

## 2016-01-21 ENCOUNTER — Emergency Department (HOSPITAL_COMMUNITY)
Admission: EM | Admit: 2016-01-21 | Discharge: 2016-01-21 | Disposition: A | Discharge status: Home / Self Care | Payer: Medicare Other | Attending: Emergency Medicine | Admitting: Emergency Medicine

## 2016-01-21 DIAGNOSIS — E119 Type 2 diabetes mellitus without complications: Secondary | ICD-10-CM | POA: Diagnosis not present

## 2016-01-21 DIAGNOSIS — I2510 Atherosclerotic heart disease of native coronary artery without angina pectoris: Secondary | ICD-10-CM | POA: Diagnosis not present

## 2016-01-21 DIAGNOSIS — I5032 Chronic diastolic (congestive) heart failure: Secondary | ICD-10-CM | POA: Insufficient documentation

## 2016-01-21 DIAGNOSIS — K625 Hemorrhage of anus and rectum: Secondary | ICD-10-CM | POA: Diagnosis not present

## 2016-01-21 DIAGNOSIS — Z7982 Long term (current) use of aspirin: Secondary | ICD-10-CM | POA: Diagnosis not present

## 2016-01-21 DIAGNOSIS — K5900 Constipation, unspecified: Secondary | ICD-10-CM | POA: Diagnosis not present

## 2016-01-21 DIAGNOSIS — K648 Other hemorrhoids: Secondary | ICD-10-CM | POA: Insufficient documentation

## 2016-01-21 DIAGNOSIS — Z79899 Other long term (current) drug therapy: Secondary | ICD-10-CM | POA: Insufficient documentation

## 2016-01-21 DIAGNOSIS — Z87891 Personal history of nicotine dependence: Secondary | ICD-10-CM | POA: Insufficient documentation

## 2016-01-21 DIAGNOSIS — R030 Elevated blood-pressure reading, without diagnosis of hypertension: Secondary | ICD-10-CM | POA: Diagnosis not present

## 2016-01-21 DIAGNOSIS — K649 Unspecified hemorrhoids: Secondary | ICD-10-CM | POA: Diagnosis not present

## 2016-01-21 HISTORY — DX: Constipation, unspecified: K59.00

## 2016-01-21 LAB — CBC
HEMATOCRIT: 42.9 % (ref 36.0–46.0)
Hemoglobin: 14.3 g/dL (ref 12.0–15.0)
MCH: 29.3 pg (ref 26.0–34.0)
MCHC: 33.3 g/dL (ref 30.0–36.0)
MCV: 87.9 fL (ref 78.0–100.0)
PLATELETS: 265 10*3/uL (ref 150–400)
RBC: 4.88 MIL/uL (ref 3.87–5.11)
RDW: 14 % (ref 11.5–15.5)
WBC: 7.5 10*3/uL (ref 4.0–10.5)

## 2016-01-21 LAB — COMPREHENSIVE METABOLIC PANEL
ALT: 17 U/L (ref 14–54)
ANION GAP: 8 (ref 5–15)
AST: 20 U/L (ref 15–41)
Albumin: 4.1 g/dL (ref 3.5–5.0)
Alkaline Phosphatase: 64 U/L (ref 38–126)
BILIRUBIN TOTAL: 1 mg/dL (ref 0.3–1.2)
BUN: 20 mg/dL (ref 6–20)
CHLORIDE: 108 mmol/L (ref 101–111)
CO2: 23 mmol/L (ref 22–32)
Calcium: 9.6 mg/dL (ref 8.9–10.3)
Creatinine, Ser: 1.34 mg/dL — ABNORMAL HIGH (ref 0.44–1.00)
GFR, EST AFRICAN AMERICAN: 40 mL/min — AB (ref >60)
GFR, EST NON AFRICAN AMERICAN: 35 mL/min — AB (ref >60)
Glucose, Bld: 119 mg/dL — ABNORMAL HIGH (ref 65–99)
POTASSIUM: 4.1 mmol/L (ref 3.5–5.1)
Sodium: 139 mmol/L (ref 135–145)
TOTAL PROTEIN: 7.2 g/dL (ref 6.5–8.1)

## 2016-01-21 NOTE — Discharge Instructions (Signed)
Increase your fluids if you are constipated.  Do not use laxatives.  If you have diarrhea or pass a large stool you may sees a small amount of blood from your internal hemorrhoids.

## 2016-01-21 NOTE — ED Triage Notes (Signed)
Dr Jeneen Rinks in room, rectal exam, internal hemorrhoids felt, hemo neg.

## 2016-01-21 NOTE — ED Notes (Signed)
Bed: [id number] Expected date:  Expected time:  Means of arrival:  Comments: 82 yo rectal bleed

## 2016-01-21 NOTE — ED Triage Notes (Signed)
Updated pt and son on delay in care, waiting for repeat blood to result.

## 2016-01-21 NOTE — ED Triage Notes (Signed)
Pt called EMS, states she is having rectal bleeding for several days after taking multiple laxatives, light red bleeding, reports bloody stools. Pt poor historian

## 2016-01-21 NOTE — ED Provider Notes (Signed)
[facility] DEPT Provider Note   CSN: [id number] Arrival date & time: 01/21/16  0935     History   Chief Complaint Chief Complaint  Patient presents with  . Rectal Bleeding    HPI [name] is a 82 y.o. female.  She presents with complaint of blood from her rectum. She is a poor historian. She states that she passed some blood. Old labs available term this was 2 days ago with a bowel movement. She feels like "it stopped and went back inside of me". She cannot recall she had a bowel movement yesterday or today. She's not having pain. She is not lightheaded or dizzy or syncopal.  HPI  Past Medical History:  Diagnosis Date  . Aortic valve disorder   . Chronic diastolic heart failure ([place])   . Constipation   . Coronary artery disease   . Dementia   . Diabetes mellitus without complication ([place])   . GERD (gastroesophageal reflux disease)   . Hyperlipidemia   . Hypertensive heart disease without CHF   . Peripheral neuropathy ([place])   . Peripheral vascular disease [facility])     Patient Active Problem List   Diagnosis Date Noted  . UTI (lower urinary tract infection) 05/23/2012  . Type 2 diabetes mellitus with vascular disease ([place])   . Dementia   . MI (myocardial infarction) ([place])   . CAD (coronary artery disease)   . Hypertensive heart disease without CHF   . Hyperlipidemia   . Peripheral vascular disease ([place])   . Chronic diastolic heart failure ([place])   . Aortic valve disorder   . GERD (gastroesophageal reflux disease)     Past Surgical History:  Procedure Laterality Date  . BREAST ENHANCEMENT SURGERY    . CAROTID ENDARTERECTOMY  12/2001  . [place], [id number]   By Dr. Camillo Flaming  . LAPAROSCOPIC CHOLECYSTECTOMY  2001  . Repair of left iliac psuedoaneurysm following cath  2006  . Repair of lymphocoele    . TONSILLECTOMY      OB History    No data available       Home Medications    Prior to Admission medications     Medication Sig Start Date End Date Taking? Authorizing Provider  aspirin EC 81 MG tablet Take 81 mg by mouth daily.   Yes Historical Provider, MD  clopidogrel (PLAVIX) 75 MG tablet Take 75 mg by mouth daily.   Yes Historical Provider, MD  ezetimibe (ZETIA) 10 MG tablet Take 10 mg by mouth daily.   Yes Historical Provider, MD  hydrALAZINE (APRESOLINE) 50 MG tablet Take 50-100 mg by mouth 2 (two) times daily. Take 100 mg every morning and 50 mg every evening   Yes Historical Provider, MD  isosorbide mononitrate (IMDUR) 30 MG 24 hr tablet Take 60 mg by mouth daily.    Yes Historical Provider, MD  losartan (COZAAR) 50 MG tablet Take 100 mg by mouth daily.    Yes Historical Provider, MD  memantine (NAMENDA) 10 MG tablet Take 10 mg by mouth 2 (two) times daily.   Yes Historical Provider, MD  metoprolol tartrate (LOPRESSOR) 50 MG tablet Take 1 tablet (50 mg total) by mouth 2 (two) times daily. Patient taking differently: Take 25 mg by mouth 2 (two) times daily.  05/25/12  Yes Thurnell Lose, MD  simvastatin (ZOCOR) 40 MG tablet Take 40 mg by mouth every evening.   Yes Historical Provider, MD    Family History No family history on  file.  Social History Social History  Substance Use Topics  . Smoking status: Former Research scientist (life sciences)  . Smokeless tobacco: Never Used  . Alcohol use No     Allergies   Clindamycin/lincomycin   Review of Systems Review of Systems  Constitutional: Negative for appetite change, chills, diaphoresis, fatigue and fever.  HENT: Negative for mouth sores, sore throat and trouble swallowing.   Eyes: Negative for visual disturbance.  Respiratory: Negative for cough, chest tightness, shortness of breath and wheezing.   Cardiovascular: Negative for chest pain.  Gastrointestinal: Positive for blood in stool. Negative for abdominal distention, abdominal pain, diarrhea, nausea and vomiting.  Endocrine: Negative for polydipsia, polyphagia and polyuria.  Genitourinary: Negative for  dysuria, frequency and hematuria.  Musculoskeletal: Negative for gait problem.  Skin: Negative for color change, pallor and rash.  Neurological: Negative for dizziness, syncope, light-headedness and headaches.  Hematological: Does not bruise/bleed easily.  Psychiatric/Behavioral: Negative for behavioral problems and confusion.     Physical Exam Updated Vital Signs BP 197/86   Pulse 70   Temp 98.4 F (36.9 C) (Oral)   Resp 14   SpO2 99%   Physical Exam  Constitutional: She is oriented to person, place, and time. She appears well-developed and well-nourished. No distress.  HENT:  Head: Normocephalic.  Eyes: Conjunctivae are normal. Pupils are equal, round, and reactive to light. No scleral icterus.  Neck: Normal range of motion. Neck supple. No thyromegaly present.  Cardiovascular: Normal rate and regular rhythm.  Exam reveals no gallop and no friction rub.   No murmur heard. Pulmonary/Chest: Effort normal and breath sounds normal. No respiratory distress. She has no wheezes. She has no rales.  Abdominal: Soft. Bowel sounds are normal. She exhibits no distension. There is no tenderness. There is no rebound.  Musculoskeletal: Normal range of motion.  Neurological: She is alert and oriented to person, place, and time.  Skin: Skin is warm and dry. No rash noted.  Psychiatric: She has a normal mood and affect. Her behavior is normal.     ED Treatments / Results  Labs (all labs ordered are listed, but only abnormal results are displayed) Labs Reviewed  COMPREHENSIVE METABOLIC PANEL - Abnormal; Notable for the following:       Result Value   Glucose, Bld 119 (*)    Creatinine, Ser 1.34 (*)    GFR calc non Af Amer 35 (*)    GFR calc Af Amer 40 (*)    All other components within normal limits  CBC  POC OCCULT BLOOD, ED  TYPE AND SCREEN    EKG  EKG Interpretation None       Radiology Dg Abd 1 View  Result Date: 01/21/2016 CLINICAL DATA:  Rectal bleeding for several  days.  Constipation. EXAM: ABDOMEN - 1 VIEW COMPARISON:  Multiple exams, including 10/23/2013 and 05/24/2012 FINDINGS: Amount of gas and stool in the colon in the upper normal range. Small bowel gas pattern unremarkable. Right upper quadrant clips compatible with prior cholecystectomy. No significant abnormal calcifications are identified. IMPRESSION: 1. Generally unremarkable bowel gas pattern. Electronically Signed   By: Van Clines M.D.   On: 01/21/2016 10:20    Procedures Procedures (including critical care time)  Medications Ordered in ED Medications - No data to display   Initial Impression / Assessment and Plan / ED Course  I have reviewed the triage vital signs and the nursing notes.  Pertinent labs & imaging results that were available during my care of the patient were reviewed  by me and considered in my medical decision making (see chart for details).  Clinical Course    Rectal exam she has palpable palpable internal hemorrhoids. There is no blood on the glove and it is guaiac-negative. Her conjunctivae are not pale. Her nailbeds are not pale.  HEENT is normal. X-ray shows no obstruction or large stool burden.  This is been something that she has focused on in the past and makes multiple calls to her physician regarding this. Her son is here. No additional concerns today. She is appropriate for discharge home. Asked to avoid laxatives. Increase fluids and fiber if constipated  Final Clinical Impressions(s) / ED Diagnoses   Final diagnoses:  None    New Prescriptions New Prescriptions   No medications on file     Tanna Furry, MD 01/21/16 1150

## 2016-01-31 ENCOUNTER — Emergency Department (HOSPITAL_COMMUNITY): Payer: Medicare Other

## 2016-01-31 ENCOUNTER — Emergency Department (HOSPITAL_COMMUNITY)
Admission: EM | Admit: 2016-01-31 | Discharge: 2016-01-31 | Disposition: A | Discharge status: Home / Self Care | Payer: Medicare Other | Attending: Emergency Medicine | Admitting: Emergency Medicine

## 2016-01-31 ENCOUNTER — Encounter (HOSPITAL_COMMUNITY): Payer: Self-pay | Admitting: Emergency Medicine

## 2016-01-31 DIAGNOSIS — Z7982 Long term (current) use of aspirin: Secondary | ICD-10-CM | POA: Insufficient documentation

## 2016-01-31 DIAGNOSIS — I2510 Atherosclerotic heart disease of native coronary artery without angina pectoris: Secondary | ICD-10-CM | POA: Insufficient documentation

## 2016-01-31 DIAGNOSIS — Z79899 Other long term (current) drug therapy: Secondary | ICD-10-CM | POA: Diagnosis not present

## 2016-01-31 DIAGNOSIS — K5900 Constipation, unspecified: Secondary | ICD-10-CM | POA: Diagnosis not present

## 2016-01-31 DIAGNOSIS — I252 Old myocardial infarction: Secondary | ICD-10-CM | POA: Diagnosis not present

## 2016-01-31 DIAGNOSIS — Z87891 Personal history of nicotine dependence: Secondary | ICD-10-CM | POA: Diagnosis not present

## 2016-01-31 DIAGNOSIS — E119 Type 2 diabetes mellitus without complications: Secondary | ICD-10-CM | POA: Insufficient documentation

## 2016-01-31 DIAGNOSIS — I5032 Chronic diastolic (congestive) heart failure: Secondary | ICD-10-CM | POA: Diagnosis not present

## 2016-01-31 DIAGNOSIS — R110 Nausea: Secondary | ICD-10-CM | POA: Diagnosis present

## 2016-01-31 DIAGNOSIS — I110 Hypertensive heart disease with heart failure: Secondary | ICD-10-CM | POA: Insufficient documentation

## 2016-01-31 DIAGNOSIS — R030 Elevated blood-pressure reading, without diagnosis of hypertension: Secondary | ICD-10-CM | POA: Diagnosis not present

## 2016-01-31 LAB — LACTIC ACID, PLASMA: LACTIC ACID, VENOUS: 1 mmol/L (ref 0.5–1.9)

## 2016-01-31 LAB — CBC WITH DIFFERENTIAL/PLATELET
BASOS PCT: 1 %
Basophils Absolute: 0 10*3/uL (ref 0.0–0.1)
Eosinophils Absolute: 0.1 10*3/uL (ref 0.0–0.7)
Eosinophils Relative: 2 %
HEMATOCRIT: 36.4 % (ref 36.0–46.0)
HEMOGLOBIN: 12.1 g/dL (ref 12.0–15.0)
LYMPHS ABS: 1.2 10*3/uL (ref 0.7–4.0)
Lymphocytes Relative: 19 %
MCH: 29.7 pg (ref 26.0–34.0)
MCHC: 33.2 g/dL (ref 30.0–36.0)
MCV: 89.4 fL (ref 78.0–100.0)
MONOS PCT: 9 %
Monocytes Absolute: 0.6 10*3/uL (ref 0.1–1.0)
NEUTROS ABS: 4.5 10*3/uL (ref 1.7–7.7)
NEUTROS PCT: 69 %
Platelets: 178 10*3/uL (ref 150–400)
RBC: 4.07 MIL/uL (ref 3.87–5.11)
RDW: 13.6 % (ref 11.5–15.5)
WBC: 6.4 10*3/uL (ref 4.0–10.5)

## 2016-01-31 LAB — URINALYSIS, ROUTINE W REFLEX MICROSCOPIC
Bilirubin Urine: NEGATIVE
GLUCOSE, UA: NEGATIVE mg/dL
Hgb urine dipstick: NEGATIVE
KETONES UR: NEGATIVE mg/dL
LEUKOCYTES UA: NEGATIVE
NITRITE: NEGATIVE
PROTEIN: NEGATIVE mg/dL
Specific Gravity, Urine: 1.015 (ref 1.005–1.030)
pH: 6.5 (ref 5.0–8.0)

## 2016-01-31 LAB — COMPREHENSIVE METABOLIC PANEL
ALBUMIN: 3.7 g/dL (ref 3.5–5.0)
ALT: 12 U/L — ABNORMAL LOW (ref 14–54)
ANION GAP: 9 (ref 5–15)
AST: 18 U/L (ref 15–41)
Alkaline Phosphatase: 48 U/L (ref 38–126)
BILIRUBIN TOTAL: 1.2 mg/dL (ref 0.3–1.2)
BUN: 20 mg/dL (ref 6–20)
CHLORIDE: 110 mmol/L (ref 101–111)
CO2: 23 mmol/L (ref 22–32)
Calcium: 8.9 mg/dL (ref 8.9–10.3)
Creatinine, Ser: 1.27 mg/dL — ABNORMAL HIGH (ref 0.44–1.00)
GFR, EST AFRICAN AMERICAN: 43 mL/min — AB (ref >60)
GFR, EST NON AFRICAN AMERICAN: 37 mL/min — AB (ref >60)
Glucose, Bld: 103 mg/dL — ABNORMAL HIGH (ref 65–99)
POTASSIUM: 3.8 mmol/L (ref 3.5–5.1)
SODIUM: 142 mmol/L (ref 135–145)
TOTAL PROTEIN: 6.1 g/dL — AB (ref 6.5–8.1)

## 2016-01-31 LAB — LIPASE, BLOOD: LIPASE: 32 U/L (ref 11–51)

## 2016-01-31 MED ORDER — SODIUM CHLORIDE 0.9 % IV BOLUS (SEPSIS)
500.0000 mL | Freq: Once | INTRAVENOUS | Status: AC
  Administered 2016-01-31: 500 mL via INTRAVENOUS

## 2016-01-31 MED ORDER — IOPAMIDOL (ISOVUE-300) INJECTION 61%
100.0000 mL | Freq: Once | INTRAVENOUS | Status: AC | PRN
  Administered 2016-01-31: 80 mL via INTRAVENOUS

## 2016-01-31 NOTE — ED Notes (Signed)
Patient transported to CT 

## 2016-01-31 NOTE — ED Triage Notes (Signed)
Per EM patient from home c/o nausea and constipation with unknown last BM.when asked patient how long she has been nauseated and feeling bad, pat responds, "long time". Patient has 20g in Left AC given 8mg  Zofran in route. Patient  Is hypertensive 220/80 in route

## 2016-01-31 NOTE — ED Provider Notes (Signed)
[facility] DEPT Provider Note   CSN: [id number] Arrival date & time: 01/31/16  [id number]     History   Chief Complaint Chief Complaint  Patient presents with  . Nausea  . constipation    HPI [name] is a 82 y.o. female.  Patient is an 68 are old female who lives at home alone with a history of coronary artery disease, valve disorder, dementia, diabetes and chronic constipation presenting today complaining of inability to have a bowel movement and nausea. She is unclear how long it's been since she had her last normal bowel movement. She states she is still eating and denies any vomiting. She has no dysuria. No chest pain or shortness of breath. She does endorse some abdominal pain and denies any alcohol use. Patient was seen in the emergency room approximately 10 days ago for rectal bleeding and constipation. She does not recall seeing any blood in her stool in the last few days.   The history is provided by the patient.    Past Medical History:  Diagnosis Date  . Aortic valve disorder   . Chronic diastolic heart failure ([place])   . Constipation   . Coronary artery disease   . Dementia   . Diabetes mellitus without complication ([place])   . GERD (gastroesophageal reflux disease)   . Hyperlipidemia   . Hypertensive heart disease without CHF   . Peripheral neuropathy ([place])   . Peripheral vascular disease [facility])     Patient Active Problem List   Diagnosis Date Noted  . UTI (lower urinary tract infection) 05/23/2012  . Type 2 diabetes mellitus with vascular disease ([place])   . Dementia   . MI (myocardial infarction) ([place])   . CAD (coronary artery disease)   . Hypertensive heart disease without CHF   . Hyperlipidemia   . Peripheral vascular disease ([place])   . Chronic diastolic heart failure ([place])   . Aortic valve disorder   . GERD (gastroesophageal reflux disease)     Past Surgical History:  Procedure Laterality Date  . BREAST ENHANCEMENT SURGERY    . CAROTID  ENDARTERECTOMY  12/2001  . [place], [id number]   By Dr. Camillo Flaming  . LAPAROSCOPIC CHOLECYSTECTOMY  2001  . Repair of left iliac psuedoaneurysm following cath  2006  . Repair of lymphocoele    . TONSILLECTOMY      OB History    No data available       Home Medications    Prior to Admission medications   Medication Sig Start Date End Date Taking? Authorizing Provider  aspirin EC 81 MG tablet Take 81 mg by mouth daily.   Yes Historical Provider, MD  clopidogrel (PLAVIX) 75 MG tablet Take 75 mg by mouth daily.   Yes Historical Provider, MD  ezetimibe (ZETIA) 10 MG tablet Take 10 mg by mouth daily.   Yes Historical Provider, MD  hydrALAZINE (APRESOLINE) 50 MG tablet Take 50-100 mg by mouth 2 (two) times daily. Take 100 mg every morning and 50 mg every evening   Yes Historical Provider, MD  isosorbide mononitrate (IMDUR) 30 MG 24 hr tablet Take 60 mg by mouth daily.    Yes Historical Provider, MD  losartan (COZAAR) 50 MG tablet Take 100 mg by mouth daily.    Yes Historical Provider, MD  memantine (NAMENDA) 10 MG tablet Take 10 mg by mouth 2 (two) times daily.   Yes Historical Provider, MD  metoprolol tartrate (LOPRESSOR) 50 MG tablet Take 1  tablet (50 mg total) by mouth 2 (two) times daily. Patient taking differently: Take 25 mg by mouth 2 (two) times daily.  05/25/12  Yes Thurnell Lose, MD  simvastatin (ZOCOR) 40 MG tablet Take 40 mg by mouth every evening.   Yes Historical Provider, MD    Family History No family history on file.  Social History Social History  Substance Use Topics  . Smoking status: Former Research scientist (life sciences)  . Smokeless tobacco: Never Used  . Alcohol use No     Allergies   Clindamycin/lincomycin   Review of Systems Review of Systems  All other systems reviewed and are negative.    Physical Exam Updated Vital Signs BP (!) 220/77   Pulse 71   Temp 98.7 F (37.1 C) (Oral)   Resp 19   SpO2 97%   Physical Exam    Constitutional: She appears well-developed and well-nourished. No distress.  HENT:  Head: Normocephalic and atraumatic.  Mouth/Throat: Oropharynx is clear and moist.  Eyes: Conjunctivae and EOM are normal. Pupils are equal, round, and reactive to light.  Neck: Normal range of motion. Neck supple.  Cardiovascular: Normal rate, regular rhythm and intact distal pulses.   Murmur heard. Pulmonary/Chest: Effort normal and breath sounds normal. No respiratory distress. She has no wheezes. She has no rales.  Abdominal: Soft. She exhibits no distension. There is tenderness in the left upper quadrant. There is no rebound, no guarding and no CVA tenderness.  Genitourinary: Vagina normal. Rectal exam shows no external hemorrhoid.  Genitourinary Comments: No stool present in vault  Musculoskeletal: Normal range of motion. She exhibits no edema or tenderness.  Neurological: She is alert.  Patient is oriented to person and place however at times she appears confused. She has repeat questioning at times and is not sure what year it is  Skin: Skin is warm and dry. No rash noted. No erythema.  Psychiatric: She has a normal mood and affect. Her behavior is normal.  Nursing note and vitals reviewed.    ED Treatments / Results  Labs (all labs ordered are listed, but only abnormal results are displayed) Labs Reviewed  COMPREHENSIVE METABOLIC PANEL - Abnormal; Notable for the following:       Result Value   Glucose, Bld 103 (*)    Creatinine, Ser 1.27 (*)    Total Protein 6.1 (*)    ALT 12 (*)    GFR calc non Af Amer 37 (*)    GFR calc Af Amer 43 (*)    All other components within normal limits  CBC WITH DIFFERENTIAL/PLATELET  LIPASE, BLOOD  URINALYSIS, ROUTINE W REFLEX MICROSCOPIC (NOT AT [facility])  LACTIC ACID, PLASMA  LACTIC ACID, PLASMA    EKG  EKG Interpretation  Date/Time:  Monday January 31 2016 10:28:29 EDT Ventricular Rate:  63 PR Interval:    QRS Duration: 92 QT Interval:  455 QTC  Calculation: 466 R Axis:   35 Text Interpretation:  Sinus rhythm Consider left atrial enlargement Probable LVH with secondary repol abnrm No significant change since last tracing Confirmed by Maryan Rued  MD, Loree Fee ([id number]) on 01/31/2016 10:37:00 AM       Radiology No results found.  Procedures Procedures (including critical care time)  Medications Ordered in ED Medications  sodium chloride 0.9 % bolus 500 mL (500 mLs Intravenous New Bag/Given 01/31/16 1028)     Initial Impression / Assessment and Plan / ED Course  I have reviewed the triage vital signs and the nursing notes.  Pertinent labs &  imaging results that were available during my care of the patient were reviewed by me and considered in my medical decision making (see chart for details).  Clinical Course   Patient is an 82 y/o female with multiple medical problems presenting today with nausea and constipation. Based on prior records she has a long history of constipation and denies taking any medication for it. She is not clear when her last bowel movement was. Her abdomen is soft and nondistended on exam but does have some left upper quadrant tenderness. She has no back pain at this time that's reproducible but does state she has pain that shoots into her back. She denies any urinary symptoms. She has no signs of fecal impaction on exam. Patient does seem to be confused unclear what her baseline is but she does live independently. She has repetitive questioning frequently but is oriented to person and place.  CBC, CMP, UA pending. Patient's EKG is unchanged from prior.   Labs without acute findings.  UA neg for infection.  CT neg for acute pathology.  Pt tolerating po's here and discussed the findings with pt and her son.  Pt's son took the pt home and they had no further questions however there was some concern that pt may be abusing laxatives??  Final Clinical Impressions(s) / ED Diagnoses   Final diagnoses:  Constipation,  unspecified constipation type    New Prescriptions Discharge Medication List as of 01/31/2016  1:57 PM       Blanchie Dessert, MD 02/04/16 1954

## 2016-01-31 NOTE — ED Notes (Signed)
MD at bedside. 

## 2016-01-31 NOTE — ED Notes (Signed)
Pt cannot use restroom at this time, aware urine specimen is needed.  

## 2016-01-31 NOTE — ED Notes (Signed)
RN stuck patient for blood work.  Blood flowing very slow and clotting before can get enough for specimens.

## 2016-03-02 ENCOUNTER — Inpatient Hospital Stay (HOSPITAL_COMMUNITY)
Admission: EM | Admit: 2016-03-02 | Discharge: 2016-03-06 | DRG: 280 | Disposition: A | Discharge status: Skilled Nursing Facility | Payer: Medicare Other | Attending: Internal Medicine | Admitting: Internal Medicine

## 2016-03-02 ENCOUNTER — Observation Stay (HOSPITAL_COMMUNITY): Payer: Medicare Other

## 2016-03-02 ENCOUNTER — Encounter (HOSPITAL_COMMUNITY): Payer: Self-pay | Admitting: Emergency Medicine

## 2016-03-02 ENCOUNTER — Emergency Department (HOSPITAL_COMMUNITY): Payer: Medicare Other

## 2016-03-02 DIAGNOSIS — N189 Chronic kidney disease, unspecified: Secondary | ICD-10-CM | POA: Diagnosis present

## 2016-03-02 DIAGNOSIS — R079 Chest pain, unspecified: Secondary | ICD-10-CM | POA: Diagnosis present

## 2016-03-02 DIAGNOSIS — I130 Hypertensive heart and chronic kidney disease with heart failure and stage 1 through stage 4 chronic kidney disease, or unspecified chronic kidney disease: Secondary | ICD-10-CM | POA: Diagnosis not present

## 2016-03-02 DIAGNOSIS — Z79899 Other long term (current) drug therapy: Secondary | ICD-10-CM

## 2016-03-02 DIAGNOSIS — E785 Hyperlipidemia, unspecified: Secondary | ICD-10-CM | POA: Diagnosis present

## 2016-03-02 DIAGNOSIS — I4891 Unspecified atrial fibrillation: Secondary | ICD-10-CM

## 2016-03-02 DIAGNOSIS — K219 Gastro-esophageal reflux disease without esophagitis: Secondary | ICD-10-CM | POA: Diagnosis present

## 2016-03-02 DIAGNOSIS — I214 Non-ST elevation (NSTEMI) myocardial infarction: Secondary | ICD-10-CM | POA: Diagnosis not present

## 2016-03-02 DIAGNOSIS — Z888 Allergy status to other drugs, medicaments and biological substances status: Secondary | ICD-10-CM

## 2016-03-02 DIAGNOSIS — E1151 Type 2 diabetes mellitus with diabetic peripheral angiopathy without gangrene: Secondary | ICD-10-CM | POA: Diagnosis present

## 2016-03-02 DIAGNOSIS — I252 Old myocardial infarction: Secondary | ICD-10-CM

## 2016-03-02 DIAGNOSIS — J449 Chronic obstructive pulmonary disease, unspecified: Secondary | ICD-10-CM | POA: Diagnosis present

## 2016-03-02 DIAGNOSIS — I480 Paroxysmal atrial fibrillation: Secondary | ICD-10-CM | POA: Diagnosis present

## 2016-03-02 DIAGNOSIS — R296 Repeated falls: Secondary | ICD-10-CM | POA: Diagnosis not present

## 2016-03-02 DIAGNOSIS — F0390 Unspecified dementia without behavioral disturbance: Secondary | ICD-10-CM | POA: Diagnosis present

## 2016-03-02 DIAGNOSIS — R000 Tachycardia, unspecified: Secondary | ICD-10-CM | POA: Diagnosis not present

## 2016-03-02 DIAGNOSIS — E1122 Type 2 diabetes mellitus with diabetic chronic kidney disease: Secondary | ICD-10-CM | POA: Diagnosis not present

## 2016-03-02 DIAGNOSIS — E1142 Type 2 diabetes mellitus with diabetic polyneuropathy: Secondary | ICD-10-CM | POA: Diagnosis present

## 2016-03-02 DIAGNOSIS — I160 Hypertensive urgency: Secondary | ICD-10-CM | POA: Diagnosis present

## 2016-03-02 DIAGNOSIS — I25118 Atherosclerotic heart disease of native coronary artery with other forms of angina pectoris: Secondary | ICD-10-CM | POA: Diagnosis present

## 2016-03-02 DIAGNOSIS — Z87891 Personal history of nicotine dependence: Secondary | ICD-10-CM

## 2016-03-02 DIAGNOSIS — Z7902 Long term (current) use of antithrombotics/antiplatelets: Secondary | ICD-10-CM

## 2016-03-02 DIAGNOSIS — R0789 Other chest pain: Secondary | ICD-10-CM | POA: Diagnosis not present

## 2016-03-02 DIAGNOSIS — Z7982 Long term (current) use of aspirin: Secondary | ICD-10-CM

## 2016-03-02 DIAGNOSIS — I359 Nonrheumatic aortic valve disorder, unspecified: Secondary | ICD-10-CM | POA: Diagnosis present

## 2016-03-02 DIAGNOSIS — Z951 Presence of aortocoronary bypass graft: Secondary | ICD-10-CM

## 2016-03-02 DIAGNOSIS — I119 Hypertensive heart disease without heart failure: Secondary | ICD-10-CM | POA: Diagnosis present

## 2016-03-02 DIAGNOSIS — W19XXXA Unspecified fall, initial encounter: Secondary | ICD-10-CM | POA: Diagnosis present

## 2016-03-02 DIAGNOSIS — E1159 Type 2 diabetes mellitus with other circulatory complications: Secondary | ICD-10-CM | POA: Diagnosis present

## 2016-03-02 DIAGNOSIS — I5043 Acute on chronic combined systolic (congestive) and diastolic (congestive) heart failure: Secondary | ICD-10-CM | POA: Diagnosis not present

## 2016-03-02 DIAGNOSIS — I2510 Atherosclerotic heart disease of native coronary artery without angina pectoris: Secondary | ICD-10-CM | POA: Diagnosis present

## 2016-03-02 DIAGNOSIS — Z66 Do not resuscitate: Secondary | ICD-10-CM | POA: Diagnosis present

## 2016-03-02 DIAGNOSIS — I5032 Chronic diastolic (congestive) heart failure: Secondary | ICD-10-CM | POA: Diagnosis present

## 2016-03-02 LAB — I-STAT CHEM 8, ED
BUN: 17 mg/dL (ref 6–20)
CHLORIDE: 107 mmol/L (ref 101–111)
Calcium, Ion: 1.07 mmol/L — ABNORMAL LOW (ref 1.15–1.40)
Creatinine, Ser: 1.2 mg/dL — ABNORMAL HIGH (ref 0.44–1.00)
Glucose, Bld: 124 mg/dL — ABNORMAL HIGH (ref 65–99)
HEMATOCRIT: 38 % (ref 36.0–46.0)
Hemoglobin: 12.9 g/dL (ref 12.0–15.0)
Potassium: 3.4 mmol/L — ABNORMAL LOW (ref 3.5–5.1)
SODIUM: 141 mmol/L (ref 135–145)
TCO2: 22 mmol/L (ref 0–100)

## 2016-03-02 LAB — BASIC METABOLIC PANEL
ANION GAP: 8 (ref 5–15)
BUN: 13 mg/dL (ref 6–20)
CALCIUM: 9.5 mg/dL (ref 8.9–10.3)
CO2: 23 mmol/L (ref 22–32)
Chloride: 108 mmol/L (ref 101–111)
Creatinine, Ser: 1.31 mg/dL — ABNORMAL HIGH (ref 0.44–1.00)
GFR calc non Af Amer: 36 mL/min — ABNORMAL LOW (ref >60)
GFR, EST AFRICAN AMERICAN: 41 mL/min — AB (ref >60)
Glucose, Bld: 125 mg/dL — ABNORMAL HIGH (ref 65–99)
Potassium: 3.4 mmol/L — ABNORMAL LOW (ref 3.5–5.1)
Sodium: 139 mmol/L (ref 135–145)

## 2016-03-02 LAB — HEPARIN LEVEL (UNFRACTIONATED): Heparin Unfractionated: 0.57 IU/mL (ref 0.30–0.70)

## 2016-03-02 LAB — CBC WITH DIFFERENTIAL/PLATELET
Basophils Absolute: 0 10*3/uL (ref 0.0–0.1)
Basophils Relative: 0 %
Eosinophils Absolute: 0.1 10*3/uL (ref 0.0–0.7)
Eosinophils Relative: 1 %
HEMATOCRIT: 37.6 % (ref 36.0–46.0)
HEMOGLOBIN: 12.3 g/dL (ref 12.0–15.0)
LYMPHS ABS: 1 10*3/uL (ref 0.7–4.0)
LYMPHS PCT: 15 %
MCH: 29.4 pg (ref 26.0–34.0)
MCHC: 32.7 g/dL (ref 30.0–36.0)
MCV: 90 fL (ref 78.0–100.0)
Monocytes Absolute: 0.4 10*3/uL (ref 0.1–1.0)
Monocytes Relative: 6 %
NEUTROS PCT: 78 %
Neutro Abs: 5.4 10*3/uL (ref 1.7–7.7)
Platelets: 194 10*3/uL (ref 150–400)
RBC: 4.18 MIL/uL (ref 3.87–5.11)
RDW: 13.8 % (ref 11.5–15.5)
WBC: 7 10*3/uL (ref 4.0–10.5)

## 2016-03-02 LAB — TROPONIN I: TROPONIN I: 9.41 ng/mL — AB (ref <0.03)

## 2016-03-02 LAB — I-STAT TROPONIN, ED: Troponin i, poc: 0.45 ng/mL (ref 0.00–0.08)

## 2016-03-02 LAB — TSH: TSH: 2.162 u[IU]/mL (ref 0.350–4.500)

## 2016-03-02 LAB — MRSA PCR SCREENING: MRSA BY PCR: NEGATIVE

## 2016-03-02 LAB — CK: CK TOTAL: 304 U/L — AB (ref 38–234)

## 2016-03-02 MED ORDER — MEMANTINE HCL 5 MG PO TABS
10.0000 mg | ORAL_TABLET | Freq: Two times a day (BID) | ORAL | Status: DC
  Administered 2016-03-02 – 2016-03-06 (×8): 10 mg via ORAL
  Filled 2016-03-02 (×8): qty 2

## 2016-03-02 MED ORDER — EZETIMIBE 10 MG PO TABS
10.0000 mg | ORAL_TABLET | Freq: Every day | ORAL | Status: DC
  Administered 2016-03-03 – 2016-03-06 (×4): 10 mg via ORAL
  Filled 2016-03-02 (×4): qty 1

## 2016-03-02 MED ORDER — ACETAMINOPHEN 325 MG PO TABS
650.0000 mg | ORAL_TABLET | ORAL | Status: DC | PRN
  Administered 2016-03-03 – 2016-03-06 (×6): 650 mg via ORAL
  Filled 2016-03-02 (×6): qty 2

## 2016-03-02 MED ORDER — SIMVASTATIN 40 MG PO TABS
40.0000 mg | ORAL_TABLET | Freq: Every evening | ORAL | Status: DC
  Administered 2016-03-02: 40 mg via ORAL
  Filled 2016-03-02: qty 1

## 2016-03-02 MED ORDER — HYDRALAZINE HCL 50 MG PO TABS
50.0000 mg | ORAL_TABLET | Freq: Three times a day (TID) | ORAL | Status: DC
  Administered 2016-03-02 – 2016-03-06 (×11): 50 mg via ORAL
  Filled 2016-03-02 (×9): qty 1
  Filled 2016-03-02: qty 2
  Filled 2016-03-02 (×2): qty 1

## 2016-03-02 MED ORDER — METOPROLOL TARTRATE 25 MG PO TABS
25.0000 mg | ORAL_TABLET | Freq: Two times a day (BID) | ORAL | Status: DC
  Administered 2016-03-02 – 2016-03-06 (×8): 25 mg via ORAL
  Filled 2016-03-02 (×9): qty 1

## 2016-03-02 MED ORDER — ENSURE ENLIVE PO LIQD
237.0000 mL | Freq: Two times a day (BID) | ORAL | Status: DC
  Administered 2016-03-03 – 2016-03-05 (×3): 237 mL via ORAL

## 2016-03-02 MED ORDER — ASPIRIN 81 MG PO CHEW
324.0000 mg | CHEWABLE_TABLET | Freq: Once | ORAL | Status: AC
  Administered 2016-03-02: 324 mg via ORAL
  Filled 2016-03-02: qty 4

## 2016-03-02 MED ORDER — LOSARTAN POTASSIUM 50 MG PO TABS: 50.0000 mg | ORAL_TABLET | Freq: Two times a day (BID) | ORAL | Status: DC

## 2016-03-02 MED ORDER — AMLODIPINE BESYLATE 5 MG PO TABS
5.0000 mg | ORAL_TABLET | Freq: Every day | ORAL | Status: DC
  Administered 2016-03-03: 5 mg via ORAL
  Filled 2016-03-02: qty 1

## 2016-03-02 MED ORDER — ONDANSETRON HCL 4 MG/2ML IJ SOLN
4.0000 mg | Freq: Four times a day (QID) | INTRAMUSCULAR | Status: DC | PRN
  Administered 2016-03-06: 4 mg via INTRAVENOUS
  Filled 2016-03-02: qty 2

## 2016-03-02 MED ORDER — ZOLPIDEM TARTRATE 5 MG PO TABS
5.0000 mg | ORAL_TABLET | Freq: Every evening | ORAL | Status: DC | PRN
  Administered 2016-03-02 – 2016-03-05 (×4): 5 mg via ORAL
  Filled 2016-03-02 (×4): qty 1

## 2016-03-02 MED ORDER — HEPARIN BOLUS VIA INFUSION
3500.0000 [IU] | Freq: Once | INTRAVENOUS | Status: AC
  Administered 2016-03-02: 3500 [IU] via INTRAVENOUS
  Filled 2016-03-02: qty 3500

## 2016-03-02 MED ORDER — HEPARIN (PORCINE) IN NACL 100-0.45 UNIT/ML-% IJ SOLN
700.0000 [IU]/h | INTRAMUSCULAR | Status: DC
  Administered 2016-03-02: 750 [IU]/h via INTRAVENOUS
  Administered 2016-03-03: 700 [IU]/h via INTRAVENOUS
  Filled 2016-03-02 (×2): qty 250

## 2016-03-02 MED ORDER — CLOPIDOGREL BISULFATE 75 MG PO TABS
75.0000 mg | ORAL_TABLET | Freq: Every day | ORAL | Status: DC
  Administered 2016-03-02 – 2016-03-06 (×5): 75 mg via ORAL
  Filled 2016-03-02 (×5): qty 1

## 2016-03-02 MED ORDER — HYDRALAZINE HCL 25 MG PO TABS: 50.0000 mg | ORAL_TABLET | Freq: Two times a day (BID) | ORAL | Status: DC

## 2016-03-02 MED ORDER — ASPIRIN EC 325 MG PO TBEC
325.0000 mg | DELAYED_RELEASE_TABLET | Freq: Every day | ORAL | Status: DC
  Administered 2016-03-03 – 2016-03-06 (×4): 325 mg via ORAL
  Filled 2016-03-02 (×4): qty 1

## 2016-03-02 MED ORDER — NITROGLYCERIN 2 % TD OINT
0.5000 [in_us] | TOPICAL_OINTMENT | Freq: Four times a day (QID) | TRANSDERMAL | Status: DC
  Administered 2016-03-02 – 2016-03-04 (×4): 0.5 [in_us] via TOPICAL
  Filled 2016-03-02: qty 30
  Filled 2016-03-02: qty 1

## 2016-03-02 MED ORDER — METOPROLOL TARTRATE 25 MG PO TABS: 25.0000 mg | ORAL_TABLET | Freq: Two times a day (BID) | ORAL | Status: DC

## 2016-03-02 MED ORDER — HYDRALAZINE HCL 20 MG/ML IJ SOLN
10.0000 mg | INTRAMUSCULAR | Status: DC | PRN
  Administered 2016-03-02: 10 mg via INTRAVENOUS
  Filled 2016-03-02: qty 1

## 2016-03-02 MED ORDER — ISOSORBIDE MONONITRATE ER 60 MG PO TB24
60.0000 mg | ORAL_TABLET | Freq: Every day | ORAL | Status: DC
  Administered 2016-03-02 – 2016-03-04 (×3): 60 mg via ORAL
  Filled 2016-03-02 (×3): qty 1

## 2016-03-02 NOTE — ED Notes (Signed)
Paged MD regarding Troponin, await call back

## 2016-03-02 NOTE — ED Triage Notes (Signed)
Pt here from home with c/o chest pain , pt was in afib on arrival 120's ems gave 10mg  of cardizem , pt went to a SB at rate of 45,

## 2016-03-02 NOTE — H&P (Addendum)
[facility] History and Physical   Patient: [name] [id number]   PCP: Mayra Neer, MD DOB: 09/21/1926   DOA: 03/02/2016   DOS: 03/02/2016   DOS: the patient was seen and examined on 03/02/2016  Patient coming from: The patient is coming from home. Lives alone. Do not want to go to any SNF/ALF  Chief Complaint: fall/ back pain  HPI: [name] is a 82 y.o. female with Past medical history of COPD S/P CABG, HTN, HLD, PVD, profound dementia, CKD. Patient presented to Hospital with EMS with complaints of a fall. Patient also complained about back pain which she mentions is consistent with heart pain for her. She denies any nausea or vomiting no diarrhea. I discussed with patient's son on the phone mentions that patient has been having multiple ER visits regarding fall, constipation, bleeding without any organic disease process. He attributes this ER visits to patient's dementia. No recent change in patient's medication has been reported. Patient apparently lives alone and do her medications herself. At the time of my evaluation other than complaining of right scapular pain patient denies any other complaints of nausea vomiting diarrhea constipation burning urination or leg swelling like pain.  ED Course: Patient was found to have elevated troponin as well as abnormal EKG. Cardiology was consulted for admission. Dr. Wynonia Lawman recommended the patient to be admitted to the hospitalist service instead and recommended making management with IV heparin.  At her baseline ambulates without support And is independent for most of her ADL; manages her medication on her own.  Review of Systems: as mentioned in the history of present illness.  All other systems reviewed and are negative.  Past Medical History:  Diagnosis Date  . Aortic valve disorder   . Chronic diastolic heart failure ([place])   . Constipation   . Coronary artery disease   . Dementia   . Diabetes  mellitus without complication ([place])   . GERD (gastroesophageal reflux disease)   . Hyperlipidemia   . Hypertensive heart disease without CHF   . Peripheral neuropathy ([place])   . Peripheral vascular disease [facility])    Past Surgical History:  Procedure Laterality Date  . BREAST ENHANCEMENT SURGERY    . CAROTID ENDARTERECTOMY  12/2001  . [place], [id number]   By Dr. Camillo Flaming  . LAPAROSCOPIC CHOLECYSTECTOMY  2001  . Repair of left iliac psuedoaneurysm following cath  2006  . Repair of lymphocoele    . TONSILLECTOMY     Social History:  reports that she has quit smoking. She has never used smokeless tobacco. She reports that she does not drink alcohol or use drugs.  Allergies  Allergen Reactions  . Clindamycin/Lincomycin Rash and Other (See Comments)    Heart problems     History reviewed. No pertinent family history.   Prior to Admission medications   Medication Sig Start Date End Date Taking? Authorizing Provider  aspirin EC 81 MG tablet Take 81 mg by mouth daily.   Yes Historical Provider, MD  clopidogrel (PLAVIX) 75 MG tablet Take 75 mg by mouth daily.   Yes Historical Provider, MD  ezetimibe (ZETIA) 10 MG tablet Take 10 mg by mouth daily.   Yes Historical Provider, MD  hydrALAZINE (APRESOLINE) 50 MG tablet Take 50-100 mg by mouth 2 (two) times daily. Take 100 mg every morning and 50 mg every evening   Yes Historical Provider, MD  isosorbide mononitrate (IMDUR) 30 MG 24 hr tablet Take 60 mg by mouth daily.  Yes Historical Provider, MD  losartan (COZAAR) 50 MG tablet Take 50 mg by mouth 2 (two) times daily.    Yes Historical Provider, MD  memantine (NAMENDA) 10 MG tablet Take 10 mg by mouth 2 (two) times daily.   Yes Historical Provider, MD  metoprolol tartrate (LOPRESSOR) 50 MG tablet Take 1 tablet (50 mg total) by mouth 2 (two) times daily. Patient taking differently: Take 25 mg by mouth 2 (two) times daily.  05/25/12  Yes Thurnell Lose, MD    simvastatin (ZOCOR) 40 MG tablet Take 40 mg by mouth every evening.   Yes Historical Provider, MD    Physical Exam: Vitals:   03/02/16 1530 03/02/16 1600 03/02/16 1630 03/02/16 1753  BP: 155/72 165/67  180/80  Pulse: 67 63 70 70  Resp: 12 14 12 12   Temp:    98.2 F (36.8 C)  TempSrc:    Oral  SpO2: 97% 96% 97% 97%  Weight:      Height:        General: Alert, Awake and Oriented to Time, Place and Person. Appear in mild distress, affect appropriate Eyes: PERRL, Conjunctiva normal ENT: Oral Mucosa clear moist. Neck: no JVD, no Abnormal Mass Or lumps Cardiovascular: S1 and S2 Present, no Murmur, Peripheral Pulses Present Respiratory: Bilateral Air entry equal and Decreased, no use of accessory muscle, Clear to Auscultation, no Crackles, no wheezes Abdomen: Bowel Sound present, Soft and no tenderness Skin: no redness, no Rash, no induration Extremities: no Pedal edema, no calf tenderness Neurologic: Grossly no focal neuro deficit. Bilaterally Equal motor strength  Labs on Admission:  CBC:  Recent Labs Lab 03/02/16 1000 03/02/16 1005  WBC 7.0  --   NEUTROABS 5.4  --   HGB 12.3 12.9  HCT 37.6 38.0  MCV 90.0  --   PLT 194  --    Basic Metabolic Panel:  Recent Labs Lab 03/02/16 1000 03/02/16 1005  NA 139 141  K 3.4* 3.4*  CL 108 107  CO2 23  --   GLUCOSE 125* 124*  BUN 13 17  CREATININE 1.31* 1.20*  CALCIUM 9.5  --    GFR: Estimated Creatinine Clearance: 27.9 mL/min (by C-G formula based on SCr of 1.2 mg/dL (H)). Liver Function Tests: No results for input(s): AST, ALT, ALKPHOS, BILITOT, PROT, ALBUMIN in the last 168 hours. No results for input(s): LIPASE, AMYLASE in the last 168 hours. No results for input(s): AMMONIA in the last 168 hours. Coagulation Profile: No results for input(s): INR, PROTIME in the last 168 hours. Cardiac Enzymes: No results for input(s): CKTOTAL, CKMB, CKMBINDEX, TROPONINI in the last 168 hours. BNP (last 3 results) No results for  input(s): PROBNP in the last 8760 hours. HbA1C: No results for input(s): HGBA1C in the last 72 hours. CBG: No results for input(s): GLUCAP in the last 168 hours. Lipid Profile: No results for input(s): CHOL, HDL, LDLCALC, TRIG, CHOLHDL, LDLDIRECT in the last 72 hours. Thyroid Function Tests:  Recent Labs  03/02/16 1000  TSH 2.162   Anemia Panel: No results for input(s): VITAMINB12, FOLATE, FERRITIN, TIBC, IRON, RETICCTPCT in the last 72 hours. Urine analysis:    Component Value Date/Time   COLORURINE YELLOW 01/31/2016 [place] 01/31/2016 1233   LABSPEC 1.015 01/31/2016 1233   PHURINE 6.5 01/31/2016 1233   GLUCOSEU NEGATIVE 01/31/2016 1233   HGBUR NEGATIVE 01/31/2016 1233   BILIRUBINUR NEGATIVE 01/31/2016 1233   KETONESUR NEGATIVE 01/31/2016 [place] 01/31/2016 1233  UROBILINOGEN 1.0 05/23/2012 0257   NITRITE NEGATIVE 01/31/2016 [place] 01/31/2016 1233    Radiological Exams on Admission: Dg Chest Portable 1 View  Result Date: 03/02/2016 CLINICAL DATA:  Acute chest pain and tachycardia. EXAM: PORTABLE CHEST 1 VIEW COMPARISON:  05/23/2012 and prior radiographs FINDINGS: Cardiomegaly, CABG changes and breast prostheses again noted. There is no evidence of focal airspace disease, pulmonary edema, suspicious pulmonary nodule/mass, pleural effusion, or pneumothorax. No acute bony abnormalities are identified. IMPRESSION: Cardiomegaly without acute cardiopulmonary disease Electronically Signed   By: Margarette Canada M.D.   On: 03/02/2016 09:54   EKG: Independently reviewed. normal sinus rhythm, ST depression in diffuse leads.  Assessment/Plan 1. Chest pain The patient presents with complains of back pain which she mentions is equivalent for her to have 'heart pain' her initial EKG and troponin does not show any signs of acute ischemia, but it does show diffuse ST depression consistent with any localized area. her chest x-ray is  showing cardiomegaly. At present I will admit the patient to the hospital for observation. I will obtain serial troponin every 6 hours, monitor on telemetry, get 2D echocardiogram in the morning to rule out ACS.  Continue with aspirin and Plavix, Zetia, Imdur, Lopressor. Cardiology was consulted, Dr. Wynonia Lawman recommends medical management, to start the patient on IV heparin and recommends patient admitted to hospitalist and he will consult in morning.   2. Fall. We'll get CT of the head. Get PT evaluation. Check CPK  3. Dementia. Continue Namenda.  4. Sinus bradycardia. A. fib. Continue beta blocker at present. Monitor on telemetry.  5. Hypertensive urgency. Blood pressure significantly elevated. Continue Imdur 60 mg, change hydralazine frequency to 3 times a day, continue Lopressor. Add amlodipine 5 mg. IV when necessary hydralazine.  Nutrition: cardiac diet DVT Prophylaxis on therapeutic anticoagulation.  Advance goals of care discussion: DNR DNI as per my discussion with SON who is her POA   Consults: cardiology DR [place] Communication: Discuss with her Son who her POA, Opportunity was given to ask question and all questions were answered satisfactorily.  Disposition: Admitted as observation, step-down unit. Likely to be discharged home, in 2 days.  Author: Berle Mull, MD [facility] Pager: [phone] 03/02/2016  If 7PM-7AM, please contact night-coverage [email or web address] Password [email or web address]

## 2016-03-02 NOTE — ED Provider Notes (Addendum)
[place] DEPT Provider Note   CSN: [id number] Arrival date & time: 03/02/16  0909     History   Chief Complaint Chief Complaint  Patient presents with  . Chest Pain   Level V caveat due to dementia. HPI [name] is a 82 y.o. female.  HPI Reportedly had an episode of chest pain this morning. Found to have atrial fibrillation with RVR. Given 10 mg IV bolus of Cardizem by EMS and then became sinus bradycardia in the 40s. Still has some chest pain but states it improved. Somewhat difficult to get history from due to dementia.   Past Medical History:  Diagnosis Date  . Aortic valve disorder   . Chronic diastolic heart failure ([place])   . Constipation   . Coronary artery disease   . Dementia   . Diabetes mellitus without complication ([place])   . GERD (gastroesophageal reflux disease)   . Hyperlipidemia   . Hypertensive heart disease without CHF   . Peripheral neuropathy ([place])   . Peripheral vascular disease [facility])     Patient Active Problem List   Diagnosis Date Noted  . UTI (lower urinary tract infection) 05/23/2012  . Type 2 diabetes mellitus with vascular disease ([place])   . Dementia   . MI (myocardial infarction)   . CAD (coronary artery disease)   . Hypertensive heart disease without CHF   . Hyperlipidemia   . Peripheral vascular disease ([place])   . Chronic diastolic heart failure ([place])   . Aortic valve disorder   . GERD (gastroesophageal reflux disease)     Past Surgical History:  Procedure Laterality Date  . BREAST ENHANCEMENT SURGERY    . CAROTID ENDARTERECTOMY  12/2001  . [place], [id number]   By Dr. Camillo Flaming  . LAPAROSCOPIC CHOLECYSTECTOMY  2001  . Repair of left iliac psuedoaneurysm following cath  2006  . Repair of lymphocoele    . TONSILLECTOMY      OB History    No data available       Home Medications    Prior to Admission medications   Medication Sig Start Date End Date Taking? Authorizing  Provider  aspirin EC 81 MG tablet Take 81 mg by mouth daily.    Historical Provider, MD  clopidogrel (PLAVIX) 75 MG tablet Take 75 mg by mouth daily.    Historical Provider, MD  ezetimibe (ZETIA) 10 MG tablet Take 10 mg by mouth daily.    Historical Provider, MD  hydrALAZINE (APRESOLINE) 50 MG tablet Take 50-100 mg by mouth 2 (two) times daily. Take 100 mg every morning and 50 mg every evening    Historical Provider, MD  isosorbide mononitrate (IMDUR) 30 MG 24 hr tablet Take 60 mg by mouth daily.     Historical Provider, MD  losartan (COZAAR) 50 MG tablet Take 100 mg by mouth daily.     Historical Provider, MD  memantine (NAMENDA) 10 MG tablet Take 10 mg by mouth 2 (two) times daily.    Historical Provider, MD  metoprolol tartrate (LOPRESSOR) 50 MG tablet Take 1 tablet (50 mg total) by mouth 2 (two) times daily. Patient taking differently: Take 25 mg by mouth 2 (two) times daily.  05/25/12   Thurnell Lose, MD  simvastatin (ZOCOR) 40 MG tablet Take 40 mg by mouth every evening.    Historical Provider, MD    Family History History reviewed. No pertinent family history.  Social History Social History  Substance Use Topics  . Smoking  status: Former Research scientist (life sciences)  . Smokeless tobacco: Never Used  . Alcohol use No     Allergies   Clindamycin/lincomycin   Review of Systems Review of Systems  Unable to perform ROS: Dementia     Physical Exam Updated Vital Signs BP 187/77 (BP Location: Right Arm)   Pulse (!) 57   Temp 97.6 F (36.4 C) (Oral)   Resp 14   Ht 5\' 1"  (1.549 m)   Wt 132 lb (59.9 kg)   SpO2 99%   BMI 24.94 kg/m   Physical Exam  Constitutional: She appears well-developed.  HENT:  Head: Atraumatic.  Neck: Neck supple. No tracheal deviation present.  Cardiovascular:  Bradycardia  Abdominal: Soft. There is no tenderness.  Musculoskeletal:  Mild bilateral lower extremity edema.  Neurological:  Awake and pleasant but mildly confused.  Skin: Skin is warm. Capillary  refill takes less than 2 seconds.  Psychiatric: She has a normal mood and affect.     ED Treatments / Results  Labs (all labs ordered are listed, but only abnormal results are displayed) Labs Reviewed  BASIC METABOLIC PANEL - Abnormal; Notable for the following:       Result Value   Potassium 3.4 (*)    Glucose, Bld 125 (*)    Creatinine, Ser 1.31 (*)    GFR calc non Af Amer 36 (*)    GFR calc Af Amer 41 (*)    All other components within normal limits  I-STAT TROPOININ, ED - Abnormal; Notable for the following:    Troponin i, poc 0.45 (*)    All other components within normal limits  I-STAT CHEM 8, ED - Abnormal; Notable for the following:    Potassium 3.4 (*)    Creatinine, Ser 1.20 (*)    Glucose, Bld 124 (*)    Calcium, Ion 1.07 (*)    All other components within normal limits  CBC WITH DIFFERENTIAL/PLATELET  TSH    EKG  EKG Interpretation  Date/Time:  Thursday March 02 2016 09:13:41 EDT Ventricular Rate:  56 PR Interval:    QRS Duration: 100 QT Interval:  465 QTC Calculation: 449 R Axis:   21 Text Interpretation:  Sinus rhythm Atrial premature complex LVH with secondary repolarization abnormality Confirmed by Alvino Chapel  MD, Mckinzee Spirito [phone]) on 03/02/2016 9:25:01 AM       Radiology Dg Chest Portable 1 View  Result Date: 03/02/2016 CLINICAL DATA:  Acute chest pain and tachycardia. EXAM: PORTABLE CHEST 1 VIEW COMPARISON:  05/23/2012 and prior radiographs FINDINGS: Cardiomegaly, CABG changes and breast prostheses again noted. There is no evidence of focal airspace disease, pulmonary edema, suspicious pulmonary nodule/mass, pleural effusion, or pneumothorax. No acute bony abnormalities are identified. IMPRESSION: Cardiomegaly without acute cardiopulmonary disease Electronically Signed   By: Margarette Canada M.D.   On: 03/02/2016 09:54    Procedures Procedures (including critical care time)  Medications Ordered in ED Medications - No data to display   Initial  Impression / Assessment and Plan / ED Course  I have reviewed the triage vital signs and the nursing notes.  Pertinent labs & imaging results that were available during my care of the patient were reviewed by me and considered in my medical decision making (see chart for details).  Clinical Course  Patient with episode of chest pain and A. fib with RVR. Reportedly pain-free now and back in sinus rhythm. Chads fast score of 6 with 4 for age 89 for female 1 for CHF 1 for hypertension and one for  vascular disease. Difficult to get IV access but her bone is mildly elevated. Will start heparin due to the high risk. Will discuss with cardiology about possible admission to them.  Final Clinical Impressions(s) / ED Diagnoses   Final diagnoses:  Atrial fibrillation with rapid ventricular response [facility])    New Prescriptions New Prescriptions   No medications on file     Davonna Belling, MD 03/02/16 1039  Cardiology will see patient and admit.    Davonna Belling, MD 03/02/16 1101

## 2016-03-02 NOTE — ED Notes (Signed)
Patient transported to CT 

## 2016-03-02 NOTE — ED Notes (Signed)
Pt given a meal tray 

## 2016-03-02 NOTE — Progress Notes (Signed)
ANTICOAGULATION CONSULT NOTE - Initial Consult  Pharmacy Consult for heparin Indication: atrial fibrillation  Allergies  Allergen Reactions  . Clindamycin/Lincomycin Rash and Other (See Comments)    Heart problems     Patient Measurements: Height: 5\' 1"  (154.9 cm) Weight: 132 lb (59.9 kg) IBW/kg (Calculated) : 47.8 Heparin Dosing Weight: 59kg  Vital Signs: Temp: 97.6 F (36.4 C) (10/12 0917) Temp Source: Oral (10/12 0917) BP: 187/77 (10/12 0917) Pulse Rate: 57 (10/12 0917)  Labs:  Recent Labs  03/02/16 1000 03/02/16 1005  HGB 12.3 12.9  HCT 37.6 38.0  PLT 194  --   CREATININE 1.31* 1.20*    Estimated Creatinine Clearance: 27.9 mL/min (by C-G formula based on SCr of 1.2 mg/dL (H)).   Medical History: Past Medical History:  Diagnosis Date  . Aortic valve disorder   . Chronic diastolic heart failure ([place])   . Constipation   . Coronary artery disease   . Dementia   . Diabetes mellitus without complication ([place])   . GERD (gastroesophageal reflux disease)   . Hyperlipidemia   . Hypertensive heart disease without CHF   . Peripheral neuropathy ([place])   . Peripheral vascular disease ([facility])     Medications:  Infusions:  . heparin      Assessment: 76 yof presented to the ED wit CP. To start IV heparin for afib. Troponin also elevated. Baseline CBC is WNL. She is not on anticoagulation PTA. Heparin briefly delayed due to lack of IV access.   Goal of Therapy:  Heparin level 0.3-0.7 units/ml Monitor platelets by anticoagulation protocol: Yes   Plan:  - Heparin bolus 3500 units IV x 1 - Heparin gtt 750 units/hr - Check an 8 hr heparin level - Daily heparin level and CBC  Kimoni Pickerill, Rande Lawman 03/02/2016,10:52 AM

## 2016-03-02 NOTE — ED Notes (Signed)
Unable to obtain IV access after 4 sticks from 2 different RN's

## 2016-03-02 NOTE — Consult Note (Signed)
Cardiology Consult Note  Admit date: 03/02/2016 Name: [name] 82 y.o.  female DOB:  31-Mar-1927 MRN:  [id number]  Today's date:  03/02/2016  Referring Physician:   [facility]   Primary Physician:    Mayra Neer  Reason for Consultation:   Back pain which is an anginal equivalent, abnormal troponin and atrial fibrillation.    IMPRESSIONS: 1.  Paroxysmal atrial fibrillation-CHA2DS2VASC score 6 currently resolved 2.  Non-STEMI 3.  Coronary artery disease with multiple bypass grafting and stenting 4.  Severe dementia 5.  History of chronic diastolic heart failure 6.  Peripheral vascular disease 7.  Hyperlipidemia 8.  Diabetes  RECOMMENDATION: 1.  Medical treatment of her coronary disease.  The patient has severe dementia and did not even know who I was nor can identify who her cardiologist was when I came in the room.  She is not a candidate for interventional therapy for coronary artery disease.  For the time being I would heparinize her and continue aspirin and Plavix, increase therapy for angina, reasonable to look at an echo in light of the JVD. 2.  In light of her dementia and current social status I do not think she would be a safe candidate for anticoagulation for atrial fibrillation 3.  Control of hypertension 4.  The big issue and consideration will be to determine competence and evaluate her living situation at home.  HISTORY: This 82 year old female female is a long-standing patient of mine.  She has had 4 previous coronary artery bypass operations.  She has also had stenting of her left main and has a known patent left internal mammary to the circumflex and a right internal mammary to the LAD she has had previous right coronary artery disease.  I last saw her a year and a half ago.  She has anginal equivalent which is her mid back pain and has always been extraordinarily difficult to evaluate and has been treated medically.  She  currently still lives alone in her own home.  She has severe dementia.  She no longer drives and her son [name] who is currently at the beach evidently takes her to church and shopping.  She evidently still manages run medicines.  She had 2 emergency room visits and September for bowel related problems.  She was brought by EMS to the hospital and found to be in atrial fibrillation, given a diltiazem bolus and converted to sinus rhythm by the time she was here.  She was complaining of mid back pain possibly a recent fall but history was difficult because of the dementia.  She came in earlier this morning and I was called around 6:00 tonight for possible admission.  I felt it best in light of her dementia for her to go on the hospitalist service and agreed to see her in consult.  When I walked into the room she did not recognize me or know who I was.  I then asked her who her cardiologist was and she could not tell me.  After I walked up and introduced myself she then seemed to have some memory of who I was.  She was not complaining of any active chest pain or shortness of breath at the time I was seeing her.  She did complain that her stomach was a little sore and that she had some bowel problems.    Past Medical History:  Diagnosis Date  . Aortic valve disorder   . Chronic diastolic heart failure (  HCC)   . Constipation   . Coronary artery disease   . Dementia   . Diabetes mellitus without complication ([place])   . GERD (gastroesophageal reflux disease)   . Hyperlipidemia   . Hypertensive heart disease without CHF   . Peripheral neuropathy ([place])   . Peripheral vascular disease [facility])      Past Surgical History:  Procedure Laterality Date  . BREAST ENHANCEMENT SURGERY    . CAROTID ENDARTERECTOMY  12/2001  . [place], [id number]   By Dr. Camillo Flaming  . LAPAROSCOPIC CHOLECYSTECTOMY  2001  . Repair of left iliac psuedoaneurysm following cath  2006  . Repair of lymphocoele     . TONSILLECTOMY      Allergies:  is allergic to clindamycin/lincomycin.   Medications: Prior to Admission medications   Medication Sig Start Date End Date Taking? Authorizing Provider  aspirin EC 81 MG tablet Take 81 mg by mouth daily.   Yes Historical Provider, MD  clopidogrel (PLAVIX) 75 MG tablet Take 75 mg by mouth daily.   Yes Historical Provider, MD  ezetimibe (ZETIA) 10 MG tablet Take 10 mg by mouth daily.   Yes Historical Provider, MD  hydrALAZINE (APRESOLINE) 50 MG tablet Take 50-100 mg by mouth 2 (two) times daily. Take 100 mg every morning and 50 mg every evening   Yes Historical Provider, MD  isosorbide mononitrate (IMDUR) 30 MG 24 hr tablet Take 60 mg by mouth daily.    Yes Historical Provider, MD  losartan (COZAAR) 50 MG tablet Take 50 mg by mouth 2 (two) times daily.    Yes Historical Provider, MD  memantine (NAMENDA) 10 MG tablet Take 10 mg by mouth 2 (two) times daily.   Yes Historical Provider, MD  metoprolol tartrate (LOPRESSOR) 50 MG tablet Take 1 tablet (50 mg total) by mouth 2 (two) times daily. Patient taking differently: Take 25 mg by mouth 2 (two) times daily.  05/25/12  Yes Thurnell Lose, MD  simvastatin (ZOCOR) 40 MG tablet Take 40 mg by mouth every evening.   Yes Historical Provider, MD   Family History: Family Status  Relation Status  . Father Deceased   pneumonia  . Mother Deceased at age 43   died following surgery  . Brother Deceased   died of CAD  . Brother Deceased   died of CAD  . Brother Deceased   died of CAD  . Sister Deceased   died of blood clots    Social History:   reports that she has quit smoking. She has never used smokeless tobacco. She reports that she does not drink alcohol or use drugs.   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  Physical  Exam: BP (!) 167/79 (BP Location: Right Arm)   Pulse 71   Temp 98 F (36.7 C) (Oral)   Resp 14   Ht 5\' 2"  (1.575 m)   Wt 51 kg (112 lb 8 oz)   SpO2 97%   BMI 20.58 kg/m   General appearance: She is a thin white female with dyed hair, she is not oriented to date and did not recognize me when I came in Head: Normocephalic, without obvious abnormality, atraumatic  Mouth: Several missing teeth Eyes: conjunctivae/corneas clear. PERRL, EOM's intact. Fundi not examined  Neck:  no adenopathy, no carotid bruit, supple, symmetrical, trachea midline and JVD appears to be somewhat full questionable V waves noted Lungs: clear to auscultation bilaterally Heart: Normal S1 and S2, no S3, regular rhythm, 2/6 murmur of aortic stenosis at the aortic valve and left sternal border, no diastolic murmur  Abdomen: Regular rhythm, normal S1-S2 no S3, 2/6 systolic murmur over the aortic valve with some radiation Pelvic: deferred Extremities: extremities normal, atraumatic, no cyanosis or edema and Healed saphenous vein harvesting scars noted Pulses: Femoral pulses 1+ with some scars, bilateral bruits, peripheral pulses diminished Skin: Skin color, texture, turgor normal. No rashes or lesions Neurologic: Not oriented to date, confused and did not know me, knows she is in the hospital, moves all extremities   Labs: CBC  Recent Labs  03/02/16 1000 03/02/16 1005  WBC 7.0  --   RBC 4.18  --   HGB 12.3 12.9  HCT 37.6 38.0  PLT 194  --   MCV 90.0  --   MCH 29.4  --   MCHC 32.7  --   RDW 13.8  --   LYMPHSABS 1.0  --   MONOABS 0.4  --   EOSABS 0.1  --   BASOSABS 0.0  --    CMP   Recent Labs  03/02/16 1000 03/02/16 1005  NA 139 141  K 3.4* 3.4*  CL 108 107  CO2 23  --   GLUCOSE 125* 124*  BUN 13 17  CREATININE 1.31* 1.20*  CALCIUM 9.5  --   GFRNONAA 36*  --   GFRAA 41*  --    Cardiac Panel (last 3 results) Troponin (Point of Care Test)  Recent Labs  03/02/16 1003  TROPIPOC 0.45*    Cardiac Panel (last 3 results)  Recent Labs  03/02/16 1750  TROPONINI 9.41*     Radiology:  No chest x-ray available for review   EKG: Sinus bradycardia, ST depression in the lateral leads, T wave inversion V2, no ST elevation   Signed:  W. Doristine Church MD [facility]   Cardiology Consultant  03/02/2016, 9:14 PM

## 2016-03-02 NOTE — ED Provider Notes (Signed)
I discussed the patient's case with Dr. Wynonia Lawman.  Patient will be admitted to the hospitalist service in stepdown.   Carmin Muskrat, MD 03/02/16 1800

## 2016-03-02 NOTE — ED Notes (Signed)
Nurse drawing labs. 

## 2016-03-02 NOTE — Progress Notes (Signed)
ANTICOAGULATION CONSULT NOTE - Follow-up  Pharmacy Consult for heparin Indication: atrial fibrillation  Allergies  Allergen Reactions  . Clindamycin/Lincomycin Rash and Other (See Comments)    Heart problems     Patient Measurements: Height: 5\' 2"  (157.5 cm) Weight: 112 lb 8 oz (51 kg) IBW/kg (Calculated) : 50.1 Heparin Dosing Weight: 59kg  Vital Signs: Temp: 98 F (36.7 C) (10/12 2029) Temp Source: Oral (10/12 2029) BP: 167/79 (10/12 2029) Pulse Rate: 71 (10/12 2029)  Labs:  Recent Labs  03/02/16 1000 03/02/16 1005 03/02/16 1750 03/02/16 2144  HGB 12.3 12.9  --   --   HCT 37.6 38.0  --   --   PLT 194  --   --   --   HEPARINUNFRC  --   --   --  0.57  CREATININE 1.31* 1.20*  --   --   TROPONINI  --   --  9.41*  --     Estimated Creatinine Clearance: 26.6 mL/min (by C-G formula based on SCr of 1.2 mg/dL (H)).   Medical History: Past Medical History:  Diagnosis Date  . Aortic valve disorder   . Chronic diastolic heart failure ([place])   . Constipation   . Coronary artery disease   . Dementia   . Diabetes mellitus without complication ([place])   . GERD (gastroesophageal reflux disease)   . Hyperlipidemia   . Hypertensive heart disease without CHF   . Peripheral neuropathy ([place])   . Peripheral vascular disease (HCC)     Medications:  Infusions:  . heparin 750 Units/hr (03/02/16 1122)    Assessment: 50 yof presented to the ED wit CP. To start IV heparin for afib. Troponin also elevated. Baseline CBC is WNL. She is not on anticoagulation PTA.   Heparin level therapeutic at 0.57.  Goal of Therapy:  Heparin level 0.3-0.7 units/ml Monitor platelets by anticoagulation protocol: Yes   Plan:  - Continue heparin gtt 750 units/hr - Daily heparin level and CBC  Aarian Griffie L Maura Braaten 03/02/2016,10:14 PM

## 2016-03-02 NOTE — Progress Notes (Signed)
Shift event: Towards beginning of shift, RN, Dorian Pod, paged this NP with abnormal troponin of 9.41. At that time no "chest pain", but "flank/back" pain per pt. BP high over 200. NP reveiwed meds. Started NTG ointment to help with ? Heart pain and high BP.  Per admit doc's note, pt stated earlier that the back pain was similar to her old heart pain. [facility] doc spoke to cardio on phone when pt seen earlier and Dr. Wynonia Lawman recommended medical therapy including Heparin drip and stated cards would see pt in am. However, pt's troponin jumped significantly to over 9, so NP paged Dr. Wynonia Lawman to let him know. Dr. Wynonia Lawman saw pt, see his note. Pt is NPO after MN tonight in for stress test in am. BP improved.  KJKG, NP [facility]

## 2016-03-02 NOTE — ED Notes (Signed)
Report to 3W, will transport pt up

## 2016-03-02 NOTE — ED Notes (Signed)
Notified MD about BP and pt report of pain.  New orders in place

## 2016-03-02 NOTE — ED Notes (Signed)
Iv team at bedside  

## 2016-03-02 NOTE — ED Notes (Signed)
MD at bedside. 

## 2016-03-03 ENCOUNTER — Ambulatory Visit (HOSPITAL_COMMUNITY): Payer: Medicare Other

## 2016-03-03 DIAGNOSIS — Z9181 History of falling: Secondary | ICD-10-CM | POA: Diagnosis not present

## 2016-03-03 DIAGNOSIS — I5043 Acute on chronic combined systolic (congestive) and diastolic (congestive) heart failure: Secondary | ICD-10-CM | POA: Diagnosis not present

## 2016-03-03 DIAGNOSIS — I160 Hypertensive urgency: Secondary | ICD-10-CM | POA: Diagnosis not present

## 2016-03-03 DIAGNOSIS — I480 Paroxysmal atrial fibrillation: Secondary | ICD-10-CM | POA: Diagnosis not present

## 2016-03-03 DIAGNOSIS — N189 Chronic kidney disease, unspecified: Secondary | ICD-10-CM | POA: Diagnosis not present

## 2016-03-03 DIAGNOSIS — F0390 Unspecified dementia without behavioral disturbance: Secondary | ICD-10-CM

## 2016-03-03 DIAGNOSIS — I130 Hypertensive heart and chronic kidney disease with heart failure and stage 1 through stage 4 chronic kidney disease, or unspecified chronic kidney disease: Secondary | ICD-10-CM | POA: Diagnosis not present

## 2016-03-03 DIAGNOSIS — Z888 Allergy status to other drugs, medicaments and biological substances status: Secondary | ICD-10-CM | POA: Diagnosis not present

## 2016-03-03 DIAGNOSIS — I5032 Chronic diastolic (congestive) heart failure: Secondary | ICD-10-CM | POA: Diagnosis not present

## 2016-03-03 DIAGNOSIS — Z7902 Long term (current) use of antithrombotics/antiplatelets: Secondary | ICD-10-CM | POA: Diagnosis not present

## 2016-03-03 DIAGNOSIS — E1142 Type 2 diabetes mellitus with diabetic polyneuropathy: Secondary | ICD-10-CM | POA: Diagnosis not present

## 2016-03-03 DIAGNOSIS — I119 Hypertensive heart disease without heart failure: Secondary | ICD-10-CM | POA: Diagnosis not present

## 2016-03-03 DIAGNOSIS — E1122 Type 2 diabetes mellitus with diabetic chronic kidney disease: Secondary | ICD-10-CM | POA: Diagnosis not present

## 2016-03-03 DIAGNOSIS — I4891 Unspecified atrial fibrillation: Secondary | ICD-10-CM | POA: Diagnosis not present

## 2016-03-03 DIAGNOSIS — Z951 Presence of aortocoronary bypass graft: Secondary | ICD-10-CM | POA: Diagnosis not present

## 2016-03-03 DIAGNOSIS — K219 Gastro-esophageal reflux disease without esophagitis: Secondary | ICD-10-CM | POA: Diagnosis present

## 2016-03-03 DIAGNOSIS — E1151 Type 2 diabetes mellitus with diabetic peripheral angiopathy without gangrene: Secondary | ICD-10-CM | POA: Diagnosis not present

## 2016-03-03 DIAGNOSIS — Z79899 Other long term (current) drug therapy: Secondary | ICD-10-CM | POA: Diagnosis not present

## 2016-03-03 DIAGNOSIS — I214 Non-ST elevation (NSTEMI) myocardial infarction: Secondary | ICD-10-CM | POA: Diagnosis not present

## 2016-03-03 DIAGNOSIS — M6281 Muscle weakness (generalized): Secondary | ICD-10-CM | POA: Diagnosis not present

## 2016-03-03 DIAGNOSIS — J449 Chronic obstructive pulmonary disease, unspecified: Secondary | ICD-10-CM | POA: Diagnosis not present

## 2016-03-03 DIAGNOSIS — I25118 Atherosclerotic heart disease of native coronary artery with other forms of angina pectoris: Secondary | ICD-10-CM | POA: Diagnosis present

## 2016-03-03 DIAGNOSIS — R0789 Other chest pain: Secondary | ICD-10-CM | POA: Diagnosis not present

## 2016-03-03 DIAGNOSIS — I359 Nonrheumatic aortic valve disorder, unspecified: Secondary | ICD-10-CM | POA: Diagnosis present

## 2016-03-03 DIAGNOSIS — F0150 Vascular dementia without behavioral disturbance: Secondary | ICD-10-CM | POA: Diagnosis not present

## 2016-03-03 DIAGNOSIS — I2510 Atherosclerotic heart disease of native coronary artery without angina pectoris: Secondary | ICD-10-CM | POA: Diagnosis not present

## 2016-03-03 DIAGNOSIS — I25110 Atherosclerotic heart disease of native coronary artery with unstable angina pectoris: Secondary | ICD-10-CM | POA: Diagnosis not present

## 2016-03-03 DIAGNOSIS — E785 Hyperlipidemia, unspecified: Secondary | ICD-10-CM | POA: Diagnosis present

## 2016-03-03 DIAGNOSIS — I739 Peripheral vascular disease, unspecified: Secondary | ICD-10-CM | POA: Diagnosis not present

## 2016-03-03 DIAGNOSIS — Z87891 Personal history of nicotine dependence: Secondary | ICD-10-CM | POA: Diagnosis not present

## 2016-03-03 DIAGNOSIS — Z7982 Long term (current) use of aspirin: Secondary | ICD-10-CM | POA: Diagnosis not present

## 2016-03-03 DIAGNOSIS — Z66 Do not resuscitate: Secondary | ICD-10-CM | POA: Diagnosis not present

## 2016-03-03 DIAGNOSIS — I252 Old myocardial infarction: Secondary | ICD-10-CM | POA: Diagnosis not present

## 2016-03-03 DIAGNOSIS — R079 Chest pain, unspecified: Secondary | ICD-10-CM | POA: Diagnosis not present

## 2016-03-03 DIAGNOSIS — I200 Unstable angina: Secondary | ICD-10-CM | POA: Diagnosis not present

## 2016-03-03 DIAGNOSIS — W19XXXA Unspecified fall, initial encounter: Secondary | ICD-10-CM | POA: Diagnosis present

## 2016-03-03 DIAGNOSIS — I209 Angina pectoris, unspecified: Secondary | ICD-10-CM | POA: Diagnosis not present

## 2016-03-03 LAB — COMPREHENSIVE METABOLIC PANEL
ALBUMIN: 3.6 g/dL (ref 3.5–5.0)
ALK PHOS: 57 U/L (ref 38–126)
ALT: 62 U/L — AB (ref 14–54)
AST: 85 U/L — AB (ref 15–41)
Anion gap: 8 (ref 5–15)
BILIRUBIN TOTAL: 0.7 mg/dL (ref 0.3–1.2)
BUN: 13 mg/dL (ref 6–20)
CALCIUM: 9.3 mg/dL (ref 8.9–10.3)
CO2: 26 mmol/L (ref 22–32)
CREATININE: 1.33 mg/dL — AB (ref 0.44–1.00)
Chloride: 107 mmol/L (ref 101–111)
GFR calc Af Amer: 41 mL/min — ABNORMAL LOW (ref >60)
GFR calc non Af Amer: 35 mL/min — ABNORMAL LOW (ref >60)
GLUCOSE: 113 mg/dL — AB (ref 65–99)
Potassium: 4.5 mmol/L (ref 3.5–5.1)
SODIUM: 141 mmol/L (ref 135–145)
Total Protein: 5.9 g/dL — ABNORMAL LOW (ref 6.5–8.1)

## 2016-03-03 LAB — CBC WITH DIFFERENTIAL/PLATELET
BASOS PCT: 0 %
Basophils Absolute: 0 10*3/uL (ref 0.0–0.1)
EOS PCT: 3 %
Eosinophils Absolute: 0.2 10*3/uL (ref 0.0–0.7)
HEMATOCRIT: 35.5 % — AB (ref 36.0–46.0)
Hemoglobin: 11.5 g/dL — ABNORMAL LOW (ref 12.0–15.0)
Lymphocytes Relative: 27 %
Lymphs Abs: 1.8 10*3/uL (ref 0.7–4.0)
MCH: 29.4 pg (ref 26.0–34.0)
MCHC: 32.4 g/dL (ref 30.0–36.0)
MCV: 90.8 fL (ref 78.0–100.0)
MONO ABS: 0.6 10*3/uL (ref 0.1–1.0)
MONOS PCT: 9 %
NEUTROS ABS: 4.1 10*3/uL (ref 1.7–7.7)
Neutrophils Relative %: 61 %
PLATELETS: 218 10*3/uL (ref 150–400)
RBC: 3.91 MIL/uL (ref 3.87–5.11)
RDW: 13.9 % (ref 11.5–15.5)
WBC: 6.6 10*3/uL (ref 4.0–10.5)

## 2016-03-03 LAB — TROPONIN I
Troponin I: 6.11 ng/mL (ref <0.03)
Troponin I: 5.36 ng/mL (ref <0.03)
Troponin I: 5.24 ng/mL (ref <0.03)

## 2016-03-03 LAB — HEPARIN LEVEL (UNFRACTIONATED): HEPARIN UNFRACTIONATED: 0.77 [IU]/mL — AB (ref 0.30–0.70)

## 2016-03-03 LAB — ECHOCARDIOGRAM COMPLETE
Height: 62 in
WEIGHTICAEL: 1808 [oz_av]

## 2016-03-03 MED ORDER — AMLODIPINE BESYLATE 5 MG PO TABS
5.0000 mg | ORAL_TABLET | Freq: Two times a day (BID) | ORAL | Status: DC
  Administered 2016-03-03 – 2016-03-06 (×7): 5 mg via ORAL
  Filled 2016-03-03 (×7): qty 1

## 2016-03-03 MED ORDER — ATORVASTATIN CALCIUM 20 MG PO TABS
20.0000 mg | ORAL_TABLET | Freq: Every day | ORAL | Status: DC
  Administered 2016-03-03 – 2016-03-06 (×4): 20 mg via ORAL
  Filled 2016-03-03 (×4): qty 1

## 2016-03-03 NOTE — Progress Notes (Signed)
PROGRESS NOTE    [name]  [id number] DOB: 1927/04/15 DOA: 03/02/2016 PCP: Mayra Neer, MD   Brief Narrative: The patient is an 82 yo Female with a PMH of CAD s/p CABG, HTN, HLD, PVD, Severe Dementia, and CKD who presented to EMS with complaints of a Fall. She complained of "back pain" which was concerning for her because it was consistent with chest pain. She was worked up and admitted and found to have an NSTEMI and Cardiology was consulted for further recommendations.   Assessment & Plan:   Principal Problem:   Chest pain Active Problems:   Type 2 diabetes mellitus with vascular disease (HCC)   Dementia   CAD (coronary artery disease)   Hypertensive heart disease without CHF   Chronic diastolic heart failure (HCC)   Paroxysmal atrial fibrillation ([place])  1. NSTEMI possibly to Atrial Fibrillation with RVR -The patient presented with complains of back pain which she mentions is equivalent for her to have 'heart pain' her initial EKG and troponin does not show any signs of acute ischemia, but it does show diffuse ST depression consistent with any localized area. -Troponins peaked around 9.41 and Trending down.  -Cardiology Dr. Lin Landsman and recommended Medical Management only given patients Dementia -C/w Heparin gtt, ASA 325 mg, Atorvastatin 20 mg, Plavix 75 mg, Metoprolol 25 mg po BID, and Nitroglycerin SL -ECHO pending read -Repeat EKG in AM -Appreciate Additional Cardiac Recc's  2. Recent Fall -CT scan showed No skull fracture or intracranial hemorrhage. Mild atrophy most notable temporal lobes.Congenital fusion occiput and C1 incidentally noted with narrowing of the foramen magnum unchanged. -Get PT evaluation and Treat. -CK total was 304.  3. Advanced Dementia. Continue Namenda 10 mg  4. Atrial Fibrillation with Resultant Sinus Loletha Grayer after administration of Cardizem -CHADS2VASC was 6 -Continue beta blocker at present. -Monitor on  telemetry. -Not a good Candidate for Anticoagulation per Cardiology because of Dementia and Social Living Situation.   5.Hypertension -Continue Hydralazine 50 mg po q8H, Metoprolol 25 mg po BID. Amlodipine changed to 5 mg BID by Cardiology  6. Hx of CAD with CABG x4 and Left Main Stent -As in #1  7. Hyperlipidemia -Continue  Atorvastatin 20 mg po qHS and Ezetimibe 10 mg po Daily  8. Hx of Chronic Diastolic Heart Failure -Obtain Echo  DVT prophylaxis: Heparin gtt Code Status: DNR Family Communication: No Family Bedside Disposition Plan: Depending on what PT recommends  Consultants:   Cardiology Dr. Wynonia Lawman  Procedures: ECHO  Antimicrobials: None  Subjective: Patient was seen and examined and stated she was having back pain but no real chest pain. Denied SOB, N/V or Lightheadedness or dizziness. No other complaints or concerns except she was cold.   Objective: Vitals:   03/02/16 2029 03/03/16 0533 03/03/16 0749 03/03/16 1313  BP: (!) 167/79 (!) 169/61 (!) 169/68 (!) 157/66  Pulse: 71 (!) 57 62 (!) 54  Resp: 14 18 15 15   Temp: 98 F (36.7 C)  98.1 F (36.7 C) 98.1 F (36.7 C)  TempSrc: Oral  Oral Oral  SpO2: 97% 100% 98% 98%  Weight: 51 kg (112 lb 8 oz) 51.3 kg (113 lb)    Height: 5\' 2"  (1.575 m)       Intake/Output Summary (Last 24 hours) at 03/03/16 1411 Last data filed at 03/03/16 1100  Gross per 24 hour  Intake           379.38 ml  Output  250 ml  Net           129.38 ml   Filed Weights   03/02/16 0912 03/02/16 2029 03/03/16 0533  Weight: 59.9 kg (132 lb) 51 kg (112 lb 8 oz) 51.3 kg (113 lb)    Examination: Physical Exam:  Constitutional: WN/WD, NAD and appears calm and comfortable Eyes: Lids and conjunctivae normal, sclerae anicteric  ENMT: External Ears, Nose appear normal. Grossly normal hearing. Missing teeth and poor dentition. Neck: Appears normal, supple, no cervical masses, normal ROM, no appreciable thyromegaly, mild  JVD. Respiratory: Clear to auscultation bilaterally, no wheezing, rales, rhonchi or crackles. Normal respiratory effort and patient is not tachypenic. No accessory muscle use.  Cardiovascular: RRR, 2/6 Systolic Murmur. No rubs / gallops. S1 and S2 auscultated. No extremity edema. 2+ pedal pulses. No carotid bruits.  Abdomen: Soft, non-tender, non-distended. No masses palpated. No appreciable hepatosplenomegaly. Bowel sounds positive.  GU: Deferred. Musculoskeletal: No clubbing / cyanosis of digits/nails. No joint deformity upper and lower extremities. No contractures. Normal strength and muscle tone.  Skin: No rashes, lesions, ulcers. No induration; Warm and dry.  Neurologic: CN 2-12 grossly intact with no focal deficits. Sensation intact in all 4 Extremities. Strength 5/5 in all 4. Romberg sign cerebellar reflexes not assessed.  Psychiatric: Normal judgment and insight. Alert and awake. Normal mood and appropriate affect.   Data Reviewed: I have personally reviewed following labs and imaging studies  CBC:  Recent Labs Lab 03/02/16 1000 03/02/16 1005 03/03/16 0621  WBC 7.0  --  6.6  NEUTROABS 5.4  --  4.1  HGB 12.3 12.9 11.5*  HCT 37.6 38.0 35.5*  MCV 90.0  --  90.8  PLT 194  --  99991111   Basic Metabolic Panel:  Recent Labs Lab 03/02/16 1000 03/02/16 1005 03/03/16 0621  NA 139 141 141  K 3.4* 3.4* 4.5  CL 108 107 107  CO2 23  --  26  GLUCOSE 125* 124* 113*  BUN 13 17 13   CREATININE 1.31* 1.20* 1.33*  CALCIUM 9.5  --  9.3   GFR: Estimated Creatinine Clearance: 24 mL/min (by C-G formula based on SCr of 1.33 mg/dL (H)). Liver Function Tests:  Recent Labs Lab 03/03/16 0621  AST 85*  ALT 62*  ALKPHOS 57  BILITOT 0.7  PROT 5.9*  ALBUMIN 3.6   No results for input(s): LIPASE, AMYLASE in the last 168 hours. No results for input(s): AMMONIA in the last 168 hours. Coagulation Profile: No results for input(s): INR, PROTIME in the last 168 hours. Cardiac  Enzymes:  Recent Labs Lab 03/02/16 1750 03/02/16 2145 03/03/16 0120 03/03/16 0621 03/03/16 1229  CKTOTAL  --  304*  --   --   --   TROPONINI 9.41*  --  6.11* 5.36* 5.24*   BNP (last 3 results) No results for input(s): PROBNP in the last 8760 hours. HbA1C: No results for input(s): HGBA1C in the last 72 hours. CBG: No results for input(s): GLUCAP in the last 168 hours. Lipid Profile: No results for input(s): CHOL, HDL, LDLCALC, TRIG, CHOLHDL, LDLDIRECT in the last 72 hours. Thyroid Function Tests:  Recent Labs  03/02/16 1000  TSH 2.162   Anemia Panel: No results for input(s): VITAMINB12, FOLATE, FERRITIN, TIBC, IRON, RETICCTPCT in the last 72 hours. Sepsis Labs: No results for input(s): PROCALCITON, LATICACIDVEN in the last 168 hours.  Recent Results (from the past 240 hour(s))  MRSA PCR Screening     Status: None   Collection Time: 03/02/16  8:35  PM  Result Value Ref Range Status   MRSA by PCR NEGATIVE NEGATIVE Final    Comment:        The GeneXpert MRSA Assay (FDA approved for NASAL specimens only), is one component of a comprehensive MRSA colonization surveillance program. It is not intended to diagnose MRSA infection nor to guide or monitor treatment for MRSA infections.     Radiology Studies: Ct Head Wo Contrast  Result Date: 03/02/2016 CLINICAL DATA:  82 year old female post recurrent falls at home. Dementia. Initial encounter. EXAM: CT HEAD WITHOUT CONTRAST TECHNIQUE: Contiguous axial images were obtained from the base of the skull through the vertex without intravenous contrast. COMPARISON:  05/23/2012 head CT. FINDINGS: Brain: No intracranial hemorrhage or CT evidence of large acute infarct Mild atrophy most notable temporal lobes. Mild chronic microvascular changes. No intracranial mass lesion noted on this unenhanced exam. Partially empty expanded sella without change and felt to be an incidental finding. Vascular: Vascular calcifications Skull: No skull  fracture.  Congenital fusion C1/ occiput. Sinuses/Orbits: No acute orbital abnormality. Post right lens replacement. Mild polypoid opacification lateral aspect left maxillary sinus. Other: Negative IMPRESSION: No skull fracture or intracranial hemorrhage. Mild atrophy most notable temporal lobes. Congenital fusion occiput and C1 incidentally noted with narrowing of the foramen magnum unchanged. Electronically Signed   By: Genia Del M.D.   On: 03/02/2016 20:18   Dg Chest Portable 1 View  Result Date: 03/02/2016 CLINICAL DATA:  Acute chest pain and tachycardia. EXAM: PORTABLE CHEST 1 VIEW COMPARISON:  05/23/2012 and prior radiographs FINDINGS: Cardiomegaly, CABG changes and breast prostheses again noted. There is no evidence of focal airspace disease, pulmonary edema, suspicious pulmonary nodule/mass, pleural effusion, or pneumothorax. No acute bony abnormalities are identified. IMPRESSION: Cardiomegaly without acute cardiopulmonary disease Electronically Signed   By: Margarette Canada M.D.   On: 03/02/2016 09:54   Scheduled Meds: . amLODipine  5 mg Oral BID PC  . aspirin EC  325 mg Oral Daily  . atorvastatin  20 mg Oral q1800  . clopidogrel  75 mg Oral Daily  . ezetimibe  10 mg Oral Daily  . feeding supplement (ENSURE ENLIVE)  237 mL Oral BID BM  . hydrALAZINE  50 mg Oral Q8H  . isosorbide mononitrate  60 mg Oral Daily  . memantine  10 mg Oral BID  . metoprolol  25 mg Oral BID  . nitroGLYCERIN  0.5 inch Topical Q6H   Continuous Infusions: . heparin 700 Units/hr (03/03/16 0846)     LOS: 1 day   Kerney Elbe, DO [facility] Pager [phone]  If 7PM-7AM, please contact night-coverage [email or web address] Password [email or web address] 03/03/2016, 2:11 PM

## 2016-03-03 NOTE — Progress Notes (Signed)
ANTICOAGULATION CONSULT NOTE - Follow-up  Pharmacy Consult for heparin Indication: atrial fibrillation  Allergies  Allergen Reactions  . Clindamycin/Lincomycin Rash and Other (See Comments)    Heart problems     Patient Measurements: Height: 5\' 2"  (157.5 cm) Weight: 113 lb (51.3 kg) IBW/kg (Calculated) : 50.1 Heparin Dosing Weight: 59kg  Vital Signs: Temp: 98.1 F (36.7 C) (10/13 0749) Temp Source: Oral (10/13 0749) BP: 169/68 (10/13 0749) Pulse Rate: 62 (10/13 0749)  Labs:  Recent Labs  03/02/16 1000 03/02/16 1005 03/02/16 1750 03/02/16 2144 03/02/16 2145 03/03/16 0120 03/03/16 0621  HGB 12.3 12.9  --   --   --   --  11.5*  HCT 37.6 38.0  --   --   --   --  35.5*  PLT 194  --   --   --   --   --  218  HEPARINUNFRC  --   --   --  0.57  --   --  0.77*  CREATININE 1.31* 1.20*  --   --   --   --   --   CKTOTAL  --   --   --   --  304*  --   --   TROPONINI  --   --  9.41*  --   --  6.11*  --     Estimated Creatinine Clearance: 26.6 mL/min (by C-G formula based on SCr of 1.2 mg/dL (H)).   Medical History: Past Medical History:  Diagnosis Date  . Aortic valve disorder   . Chronic diastolic heart failure ([place])   . Constipation   . Coronary artery disease   . Dementia   . Diabetes mellitus without complication ([place])   . GERD (gastroesophageal reflux disease)   . Hyperlipidemia   . Hypertensive heart disease without CHF   . Peripheral neuropathy ([place])   . Peripheral vascular disease (HCC)     Medications:  Infusions:  . heparin 750 Units/hr (03/02/16 1122)    Assessment: 40 yof presented to the ED wit CP on heparin for NSTEMI/afib. Heparin level is 0.77 and above goal. Per notes- no plans for cath and not a candidate for oral anticoagulation.   Goal of Therapy:  Heparin level 0.3-0.7 units/ml Monitor platelets by anticoagulation protocol: Yes   Plan:  -Decrease heparin to 700units/hr - Daily heparin level and CBC  Hildred Laser, Pharm D 03/03/2016  8:38 AM

## 2016-03-03 NOTE — Progress Notes (Signed)
Initial Nutrition Assessment  DOCUMENTATION CODES:   Not applicable  INTERVENTION:    Continue Ensure Enlive po BID, each supplement provides 350 kcal and 20 grams of protein  NUTRITION DIAGNOSIS:   Inadequate oral intake related to poor appetite as evidenced by meal completion < 50%.  GOAL:   Patient will meet greater than or equal to 90% of their needs  MONITOR:   PO intake, Supplement acceptance, Labs, Weight trends, Skin  REASON FOR ASSESSMENT:   Malnutrition Screening Tool    ASSESSMENT:   82 y.o. female with Past medical history of COPD S/P CABG, HTN, HLD, PVD, profound dementia, CKD. Presented to the ED by EMS after fall.  Cardiac work-up ongoing. Plans for stress test today. Unable to speak with patient at this time. Unable to complete Nutrition-Focused physical exam.  Possible weight loss reported on malnutrition screening tool. Unsure of usual weight. With hx of dementia, poor intake is highly likely; will continue PO supplements as ordered.  Diet Order:  Diet Heart Room service appropriate? Yes; Fluid consistency: Thin Diet NPO time specified  Skin:  Reviewed, no issues  Last BM:  10/10  Height:   Ht Readings from Last 1 Encounters:  03/02/16 5\' 2"  (1.575 m)    Weight:   Wt Readings from Last 1 Encounters:  03/03/16 113 lb (51.3 kg)    Ideal Body Weight:  50 kg  BMI:  Body mass index is 20.67 kg/m.  Estimated Nutritional Needs:   Kcal:  1300-1500  Protein:  70-80 gm  Fluid:  1.5 L  EDUCATION NEEDS:   No education needs identified at this time  Molli Barrows, [place], [place], [place] Pager [phone] After Hours Pager [phone]

## 2016-03-03 NOTE — Care Management Note (Addendum)
Case Management Note  Patient Details  Name: [name] MRN: [id number] Date of Birth: 1926/06/17  Subjective/Objective:    Pt presented for Chest Pain. Pt is from home alone.  Pt has a hx of Dementia- pt has support of Son [name].  Per son pt has DME RW and Cane at home. Son states they visit her twice a day. Son takes her to MD appointments, grocery store and to church on Sundays.  Per son patient still cooks and they provide food at times.            Action/Plan: CM discussed disposition needs in regards to if recommendations were made for SNF- pt is observation status and that would mean an out of pocket cost for pt if SNF would be the plan. Son stated they have discussed the idea of ALF and pt was not agreeable in the past. CM discussed the option of if recommendations were for 24 hour supervision in the home and if they could provide. Son was not able to answer that question. Pt has used [facility] in the past for [place]. CM did make son aware that CM or CSW would give him a call in regards to d/c plan. CM spoke to patient and she feels like she will not need any HH Services at d/c. Consult placed for [facility] to speak with pt as an additional resource. CM will continue to monitor for additional needs.   Expected Discharge Date:                  Expected Discharge Plan:  [place]  In-House Referral:  Clinical Social Work  Discharge planning Services  CM Consult  Post Acute Care Choice:    Choice offered to:     DME Arranged:    DME Agency:     HH Arranged:    [place] Agency:     Status of Service:  In process, will continue to follow  If discussed at Long Length of Stay Meetings, dates discussed:    Additional Comments:  Bethena Roys, RN 03/03/2016, 3:06 PM

## 2016-03-03 NOTE — Progress Notes (Signed)
  Echocardiogram 2D Echocardiogram has been performed.  Tresa Res 03/03/2016, 11:44 AM

## 2016-03-03 NOTE — Care Management Obs Status (Signed)
[place] NOTIFICATION   Patient Details  Name: [name] MRN: [id number] Date of Birth: [birth year]   Medicare Observation Status Notification Given:  Yes    Bethena Roys, RN 03/03/2016, 3:05 PM

## 2016-03-03 NOTE — Consult Note (Signed)
   [facility] CM Inpatient Consult   03/03/2016  [name] [birth year] [id number]    [facility] Care Management referral received. Went to bedside to discuss [name] Management program. Patient confused. Telephone call made to son, [name]. No answer. Left HIPPA compliant voicemail message to request call back. Will leave [facility] Care Management packet at patient's bedside and will await return call. If [name] is here on Monday will follow up again at bedside. Otherwise, will attempt to reach son by phone.    Marthenia Rolling, MSN-Ed, RN,BSN [facility] Liaison [phone]

## 2016-03-03 NOTE — Progress Notes (Signed)
PT Cancellation Note  Patient Details Name: [name] MRN: [id number] DOB: [birth year]   Cancelled Treatment:    Reason Eval/Treat Not Completed: Patient at procedure or test/unavailable;Medical issues which prohibited therapy.  Will try again tomorrow.   Ramond Dial 03/03/2016, 12:01 PM    Mee Hives, PT MS Acute Rehab Dept. Number: [place] and [place]

## 2016-03-03 NOTE — Progress Notes (Signed)
PT Cancellation Note  Patient Details Name: [name] MRN: [id number] DOB: Oct 01, 1926   Cancelled Treatment:    Reason Eval/Treat Not Completed: Patient not medically ready (stress test planned with critical labs, wait and ck later).   Ramond Dial 03/03/2016, 8:45 AM    Mee Hives, PT MS Acute Rehab Dept. Number: [place] and [place]

## 2016-03-03 NOTE — Progress Notes (Signed)
Subjective:  She is more alert at lunchtime today.  She still could not identify me by name but did tell me that I was her heart doctor.  Complained of some vague left-sided chest discomfort.  No recurrent back pain.  Troponin elevated but coming down now.  Objective:  Vital Signs in the last 24 hours: BP (!) 157/66 (BP Location: Right Arm)   Pulse (!) 54   Temp 98.1 F (36.7 C) (Oral)   Resp 15   Ht 5\' 2"  (1.575 m)   Wt 51.3 kg (113 lb)   SpO2 98%   BMI 20.67 kg/m   Physical Exam: Pleasant female in no acute distress knows she is in the hospital and could not identify me by name Lungs:  Clear Cardiac:  Regular rhythm, normal S1 and S2, no S3, 2/6 systolic murmur of aortic stenosis Extremities:  No edema present reduced pedal pulses  Intake/Output from previous day: 10/12 0701 - 10/13 0700 In: 139.4 [I.V.:139.4] Out: 250 [Urine:250]  Weight Filed Weights   03/02/16 0912 03/02/16 2029 03/03/16 0533  Weight: 59.9 kg (132 lb) 51 kg (112 lb 8 oz) 51.3 kg (113 lb)    Lab Results: Basic Metabolic Panel:  Recent Labs  03/02/16 1000 03/02/16 1005 03/03/16 0621  NA 139 141 141  K 3.4* 3.4* 4.5  CL 108 107 107  CO2 23  --  26  GLUCOSE 125* 124* 113*  BUN 13 17 13   CREATININE 1.31* 1.20* 1.33*   CBC:  Recent Labs  03/02/16 1000 03/02/16 1005 03/03/16 0621  WBC 7.0  --  6.6  NEUTROABS 5.4  --  4.1  HGB 12.3 12.9 11.5*  HCT 37.6 38.0 35.5*  MCV 90.0  --  90.8  PLT 194  --  218   Cardiac Enzymes: Troponin (Point of Care Test)  Recent Labs  03/02/16 1003  TROPIPOC 0.45*   Cardiac Panel (last 3 results)  Recent Labs  03/02/16 2145 03/03/16 0120 03/03/16 0621 03/03/16 1229  CKTOTAL 304*  --   --   --   TROPONINI  --  6.11* 5.36* 5.24*    Telemetry: Sinus bradycardia, no recurrent atrial fibrillation  Assessment/Plan:  1.  Non-STEMI may be due to recent atrial fibrillation with rapid response 2.  Severe underlying dementia 3.  CAD with previous  coronary bypass grafting on 4 separate occasions as well as previous left main stent 4.  Hypertensive heart disease above goal  Recommendations:  I would not do any stress testing on her.  I don't consider her a candidate for further cardiac interventions due to her significant dementia.  I would intensify medical therapy including good blood pressure control.  The dementia makes the history quite difficult.  The major issue is that  need to be addressed is the safety of her being able to stay in in her own home alone.    Increase amlodipine to 5 mg twice daily to try to get better blood pressure control may increase nitrates if needed.  Repeat EKG.  I would not consider to be a good candidate for anticoagulation for atrial fibrillation in light of her dementia and social situation     W. Tollie Eth, Brooke Bonito.  MD [facility]  03/03/2016, 1:49 PM

## 2016-03-04 DIAGNOSIS — W19XXXA Unspecified fall, initial encounter: Secondary | ICD-10-CM

## 2016-03-04 DIAGNOSIS — I119 Hypertensive heart disease without heart failure: Secondary | ICD-10-CM

## 2016-03-04 DIAGNOSIS — E1159 Type 2 diabetes mellitus with other circulatory complications: Secondary | ICD-10-CM

## 2016-03-04 DIAGNOSIS — I5032 Chronic diastolic (congestive) heart failure: Secondary | ICD-10-CM

## 2016-03-04 DIAGNOSIS — I10 Essential (primary) hypertension: Secondary | ICD-10-CM

## 2016-03-04 DIAGNOSIS — I200 Unstable angina: Secondary | ICD-10-CM

## 2016-03-04 DIAGNOSIS — F0150 Vascular dementia without behavioral disturbance: Secondary | ICD-10-CM

## 2016-03-04 DIAGNOSIS — I480 Paroxysmal atrial fibrillation: Secondary | ICD-10-CM

## 2016-03-04 DIAGNOSIS — I25110 Atherosclerotic heart disease of native coronary artery with unstable angina pectoris: Secondary | ICD-10-CM

## 2016-03-04 DIAGNOSIS — I214 Non-ST elevation (NSTEMI) myocardial infarction: Principal | ICD-10-CM

## 2016-03-04 DIAGNOSIS — I4891 Unspecified atrial fibrillation: Secondary | ICD-10-CM

## 2016-03-04 LAB — MAGNESIUM: Magnesium: 2 mg/dL (ref 1.7–2.4)

## 2016-03-04 LAB — BASIC METABOLIC PANEL
Anion gap: 8 (ref 5–15)
BUN: 17 mg/dL (ref 6–20)
CALCIUM: 9.2 mg/dL (ref 8.9–10.3)
CHLORIDE: 108 mmol/L (ref 101–111)
CO2: 22 mmol/L (ref 22–32)
CREATININE: 1.2 mg/dL — AB (ref 0.44–1.00)
GFR, EST AFRICAN AMERICAN: 46 mL/min — AB (ref >60)
GFR, EST NON AFRICAN AMERICAN: 40 mL/min — AB (ref >60)
Glucose, Bld: 106 mg/dL — ABNORMAL HIGH (ref 65–99)
POTASSIUM: 3.8 mmol/L (ref 3.5–5.1)
SODIUM: 138 mmol/L (ref 135–145)

## 2016-03-04 LAB — CBC
HCT: 36.2 % (ref 36.0–46.0)
HEMOGLOBIN: 11.7 g/dL — AB (ref 12.0–15.0)
MCH: 29.2 pg (ref 26.0–34.0)
MCHC: 32.3 g/dL (ref 30.0–36.0)
MCV: 90.3 fL (ref 78.0–100.0)
Platelets: 195 10*3/uL (ref 150–400)
RBC: 4.01 MIL/uL (ref 3.87–5.11)
RDW: 13.7 % (ref 11.5–15.5)
WBC: 6.7 10*3/uL (ref 4.0–10.5)

## 2016-03-04 LAB — PHOSPHORUS: Phosphorus: 3.4 mg/dL (ref 2.5–4.6)

## 2016-03-04 LAB — HEPARIN LEVEL (UNFRACTIONATED): Heparin Unfractionated: 0.53 IU/mL (ref 0.30–0.70)

## 2016-03-04 MED ORDER — ISOSORBIDE MONONITRATE ER 30 MG PO TB24
30.0000 mg | ORAL_TABLET | Freq: Once | ORAL | Status: AC
  Administered 2016-03-04: 30 mg via ORAL

## 2016-03-04 MED ORDER — ISOSORBIDE MONONITRATE ER 60 MG PO TB24
90.0000 mg | ORAL_TABLET | Freq: Every day | ORAL | Status: DC
  Administered 2016-03-05 – 2016-03-06 (×2): 90 mg via ORAL
  Filled 2016-03-04 (×3): qty 1

## 2016-03-04 NOTE — Progress Notes (Signed)
PROGRESS NOTE    [name]  [id number] DOB: 1927/01/02 DOA: 03/02/2016 PCP: Mayra Neer, MD   Brief Narrative: The patient is an 82 yo Female with a PMH of CAD s/p CABG, HTN, HLD, PVD, Severe Dementia, and CKD who presented to EMS with complaints of a Fall. She complained of "back pain" which was concerning for her because it was consistent with chest pain. She was worked up and admitted and found to have an NSTEMI and Cardiology was consulted for further recommendations. Patient being Medically Treated for NSTEMI.   Assessment & Plan:   Principal Problem:   Chest pain Active Problems:   Type 2 diabetes mellitus with vascular disease (HCC)   Dementia   CAD (coronary artery disease)   Hypertensive heart disease without CHF   Chronic diastolic heart failure (HCC)   Paroxysmal atrial fibrillation ([place])  1. NSTEMI possibly to Atrial Fibrillation with RVR -The patient presented with complains of back pain which she mentions is equivalent for her to have 'heart pain' her initial EKG and troponin does not show any signs of acute ischemia, but it does show diffuse ST depression consistent with any localized area. -Troponins peaked around 9.41 and Trending down.  -Cardiology Dr. Lin Landsman and recommended Medical Management only given patients Dementia - ASA 325 mg, Atorvastatin 20 mg, Plavix 75 mg, Metoprolol 25 mg po BID, and Nitroglycerin SL -*Cardiology increased Imdur to 90 mg.  -ECHO showed Left ventricle: The cavity size was normal. There was moderate concentric hypertrophy. Systolic function was mildly reduced. The estimated ejection fraction was in the range of 45% to 50%. Regional wall motion abnormalities cannot be excluded.- Aortic valve: Transvalvular velocity was minimally increased. There was very mild stenosis. There was mild regurgitation. Valve  area (VTI): 1.39 cm^2. Valve area (Vmax): 1.22 cm^2. Valve area  (Vmean): 1.17 cm^2.- Left atrium: The atrium  was mildly dilated. -D/C'd Heparin gtt -Appreciate Additional Cardiac Recc's  2. Recent Fall -CT scan showed No skull fracture or intracranial hemorrhage. Mild atrophy most notable temporal lobes.Congenital fusion occiput and C1 incidentally noted with narrowing of the foramen magnum unchanged. -PT evaluation and Treat recommended SNF placement. -Social Work Consulted for [employer].  -CK total was 304.  3. Advanced Dementia. Continue Namenda 10 mg  4. Atrial Fibrillation with Resultant Sinus Loletha Grayer after administration of Cardizem -CHADS2VASC was 6 -Continue beta blocker at present. HR ranged from 52-75 -C/w Telemetry Monitoring. -Not a good Candidate for Anticoagulation per Cardiology because of Dementia and Social Living Situation.   5.Hypertension -Continue Hydralazine 50 mg po q8H, Metoprolol 25 mg po BID. Amlodipine changed to 5 mg BID yesterday by Cardiology. -Today Cardiology increased Imdur to 90 mg.   6. Hx of CAD with CABG x4 and Left Main Stent -As in #1  7. Hyperlipidemia -Continue  Atorvastatin 20 mg po qHS and Ezetimibe 10 mg po Daily  8. Hx of Chronic Diastolic Heart Failure -ECHO showed  DVT prophylaxis: Heparin gtt Discontinued. SCD's  Code Status: DNR Family Communication: No Family Bedside; Discussed Plan of Care with Patient's Son over the phone.  Disposition Plan: SNF  Consultants:   Cardiology   Procedures: ECHO  Antimicrobials: None  Subjective: Patient was seen and examined and stated she again was having back pain. Had no CP/SOB/N/V. Stated she didn't sleep last night but doesn't usually sleep well. No other complaints or concerns at this time.   Objective: Vitals:   03/04/16 0737 03/04/16 0746 03/04/16 1213 03/04/16 1639  BP:  Marland Kitchen)  203/93 (!) 143/67 (!) 158/49  Pulse: (!) 52 75 (!) 56 (!) 58  Resp: 12 20 (!) 22 18  Temp:   98.7 F (37.1 C) 98.1 F (36.7 C)  TempSrc:   Oral Oral  SpO2: 96% 98% 97% 98%  Weight:      Height:         Intake/Output Summary (Last 24 hours) at 03/04/16 1830 Last data filed at 03/04/16 1214  Gross per 24 hour  Intake           649.76 ml  Output              800 ml  Net          -150.24 ml   Filed Weights   03/02/16 2029 03/03/16 0533 03/04/16 0300  Weight: 51 kg (112 lb 8 oz) 51.3 kg (113 lb) 51.3 kg (113 lb 3.2 oz)    Examination: Physical Exam:  Constitutional: Thin elderly female, NAD and appears calm and comfortable Eyes: Lids and conjunctivae normal, sclerae anicteric  ENMT: External Ears, Nose appear normal. Grossly normal hearing. Missing teeth and poor dentition. Neck: Appears normal, supple, no cervical masses, normal ROM, no appreciable thyromegaly, mild JVD. Respiratory: Clear to auscultation bilaterally, no wheezing, rales, rhonchi or crackles. Normal respiratory effort and patient is not tachypenic. No accessory muscle use.  Cardiovascular: RRR, 2/6 Systolic Murmur. No rubs / gallops. S1 and S2 auscultated. No extremity edema. 2+ pedal pulses. No carotid bruits.  Abdomen: Soft, non-tender, non-distended. No masses palpated. No appreciable hepatosplenomegaly. Bowel sounds positive.  GU: Deferred. Musculoskeletal: No clubbing / cyanosis of digits/nails. No joint deformity upper and lower extremities. No contractures. Normal strength and muscle tone.  Skin: No rashes, lesions, ulcers. No induration; Warm and dry.  Neurologic: CN 2-12 grossly intact with no focal deficits. Sensation intact in all 4 Extremities.  Romberg sign cerebellar reflexes not assessed.  Psychiatric: Normal judgment and insight. Alert and awake. Normal mood and appropriate affect.   Data Reviewed: I have personally reviewed following labs and imaging studies  CBC:  Recent Labs Lab 03/02/16 1000 03/02/16 1005 03/03/16 0621 03/04/16 0526  WBC 7.0  --  6.6 6.7  NEUTROABS 5.4  --  4.1  --   HGB 12.3 12.9 11.5* 11.7*  HCT 37.6 38.0 35.5* 36.2  MCV 90.0  --  90.8 90.3  PLT 194  --  218 0000000    Basic Metabolic Panel:  Recent Labs Lab 03/02/16 1000 03/02/16 1005 03/03/16 0621 03/04/16 0526  NA 139 141 141 138  K 3.4* 3.4* 4.5 3.8  CL 108 107 107 108  CO2 23  --  26 22  GLUCOSE 125* 124* 113* 106*  BUN 13 17 13 17   CREATININE 1.31* 1.20* 1.33* 1.20*  CALCIUM 9.5  --  9.3 9.2  MG  --   --   --  2.0  PHOS  --   --   --  3.4   GFR: Estimated Creatinine Clearance: 26.6 mL/min (by C-G formula based on SCr of 1.2 mg/dL (H)). Liver Function Tests:  Recent Labs Lab 03/03/16 0621  AST 85*  ALT 62*  ALKPHOS 57  BILITOT 0.7  PROT 5.9*  ALBUMIN 3.6   No results for input(s): LIPASE, AMYLASE in the last 168 hours. No results for input(s): AMMONIA in the last 168 hours. Coagulation Profile: No results for input(s): INR, PROTIME in the last 168 hours. Cardiac Enzymes:  Recent Labs Lab 03/02/16 1750 03/02/16 2145 03/03/16 0120 03/03/16  [id number] 03/03/16 1229  CKTOTAL  --  304*  --   --   --   TROPONINI 9.41*  --  6.11* 5.36* 5.24*   BNP (last 3 results) No results for input(s): PROBNP in the last 8760 hours. HbA1C: No results for input(s): HGBA1C in the last 72 hours. CBG: No results for input(s): GLUCAP in the last 168 hours. Lipid Profile: No results for input(s): CHOL, HDL, LDLCALC, TRIG, CHOLHDL, LDLDIRECT in the last 72 hours. Thyroid Function Tests:  Recent Labs  03/02/16 1000  TSH 2.162   Anemia Panel: No results for input(s): VITAMINB12, FOLATE, FERRITIN, TIBC, IRON, RETICCTPCT in the last 72 hours. Sepsis Labs: No results for input(s): PROCALCITON, LATICACIDVEN in the last 168 hours.  Recent Results (from the past 240 hour(s))  MRSA PCR Screening     Status: None   Collection Time: 03/02/16  8:35 PM  Result Value Ref Range Status   MRSA by PCR NEGATIVE NEGATIVE Final    Comment:        The GeneXpert MRSA Assay (FDA approved for NASAL specimens only), is one component of a comprehensive MRSA colonization surveillance program. It is  not intended to diagnose MRSA infection nor to guide or monitor treatment for MRSA infections.     Radiology Studies: Ct Head Wo Contrast  Result Date: 03/02/2016 CLINICAL DATA:  82 year old female post recurrent falls at home. Dementia. Initial encounter. EXAM: CT HEAD WITHOUT CONTRAST TECHNIQUE: Contiguous axial images were obtained from the base of the skull through the vertex without intravenous contrast. COMPARISON:  05/23/2012 head CT. FINDINGS: Brain: No intracranial hemorrhage or CT evidence of large acute infarct Mild atrophy most notable temporal lobes. Mild chronic microvascular changes. No intracranial mass lesion noted on this unenhanced exam. Partially empty expanded sella without change and felt to be an incidental finding. Vascular: Vascular calcifications Skull: No skull fracture.  Congenital fusion C1/ occiput. Sinuses/Orbits: No acute orbital abnormality. Post right lens replacement. Mild polypoid opacification lateral aspect left maxillary sinus. Other: Negative IMPRESSION: No skull fracture or intracranial hemorrhage. Mild atrophy most notable temporal lobes. Congenital fusion occiput and C1 incidentally noted with narrowing of the foramen magnum unchanged. Electronically Signed   By: Genia Del M.D.   On: 03/02/2016 20:18   ECHOCARDIOGRAM Study Conclusions  - Left ventricle: The cavity size was normal. There was moderate   concentric hypertrophy. Systolic function was mildly reduced. The   estimated ejection fraction was in the range of 45% to 50%.   Regional wall motion abnormalities cannot be excluded. - Aortic valve: Transvalvular velocity was minimally increased.   There was very mild stenosis. There was mild regurgitation. Valve   area (VTI): 1.39 cm^2. Valve area (Vmax): 1.22 cm^2. Valve area   (Vmean): 1.17 cm^2. - Left atrium: The atrium was mildly dilated.  Scheduled Meds: . amLODipine  5 mg Oral BID PC  . aspirin EC  325 mg Oral Daily  . atorvastatin   20 mg Oral q1800  . clopidogrel  75 mg Oral Daily  . ezetimibe  10 mg Oral Daily  . feeding supplement (ENSURE ENLIVE)  237 mL Oral BID BM  . hydrALAZINE  50 mg Oral Q8H  . isosorbide mononitrate  30 mg Oral Once  . [START ON 03/05/2016] isosorbide mononitrate  90 mg Oral Daily  . memantine  10 mg Oral BID  . metoprolol  25 mg Oral BID  . nitroGLYCERIN  0.5 inch Topical Q6H   Continuous Infusions: . heparin 700 Units/hr (03/03/16  1501)    LOS: 2 days   Kerney Elbe, DO [facility] Pager [phone]  If 7PM-7AM, please contact night-coverage [email or web address] Password [email or web address] 03/04/2016, 6:30 PM

## 2016-03-04 NOTE — Evaluation (Signed)
Physical Therapy Evaluation Patient Details Name: [name] MRN: [id number] DOB: Jul 08, 1926 Today's Date: 03/04/2016   History of Present Illness  82 yo Female with a PMH of CAD s/p CABG, HTN, HLD, PVD, Severe Dementia, and CKD who presented to EMS with complaints of a Fall. She complained of "back pain" which was concerning for her because it was consistent with chest pain. She was worked up and admitted and found to have an NSTEMI  Clinical Impression  Patient demonstrates deficits in functional mobility as indicated below. Will need continued skilled PT to address deficits and maixmize function. Will see as indicated and progress as tolerated.     Follow Up Recommendations SNF;Supervision/Assistance - 24 hour (unless family can provide 24/7 assist HIGH FALL RISK)    Equipment Recommendations  None recommended by PT    Recommendations for Other Services       Precautions / Restrictions Precautions Precautions: Fall      Mobility  Bed Mobility Overal bed mobility: Needs Assistance Bed Mobility: Supine to Sit     Supine to sit: Min assist     General bed mobility comments: min assist to come to EOB using UE support  Transfers Overall transfer level: Needs assistance Equipment used: 1 person hand held assist Transfers: Sit to/from Stand Sit to Stand: Min assist         General transfer comment: min assist for stability, 2 posterior LOB when coming to standing  Ambulation/Gait Ambulation/Gait assistance: Min assist Ambulation Distance (Feet): 40 Feet Assistive device: 1 person hand held assist Gait Pattern/deviations: Step-through pattern;Decreased stride length;Shuffle;Trunk flexed;Narrow base of support Gait velocity: decreased   General Gait Details: multiple episodes of instablity with near LOB. Required hands on assist at all times.  Stairs            Wheelchair Mobility    Modified Rankin (Stroke Patients Only)       Balance Overall  balance assessment: Needs assistance Sitting-balance support: Feet supported Sitting balance-Leahy Scale: Fair Sitting balance - Comments: able to sit on BSC without difficulty some posterior lean while sitting EOB, but no assist required   Standing balance support: Single extremity supported Standing balance-Leahy Scale: Poor Standing balance comment: posterior LOB in static standing, needs UE support at all times to maintain balance, utilized elbow prop while washing hands at sink and hands on tactile support from back             High level balance activites: Head turns;Turns High Level Balance Comments: min assist with 2 noted LOB requiring moderate assist             Pertinent Vitals/Pain Pain Assessment: 0-10 Pain Score: 6  Pain Location: back of her chest Pain Descriptors / Indicators: Sharp Pain Intervention(s): Monitored during session    Home Living Family/patient expects to be discharged to:: Private residence (Patient has refused ALF/SNF referral in past.) Living Arrangements: Alone Available Help at Discharge: Neighbor;Available PRN/intermittently Type of Home: House Home Access: Stairs to enter Entrance Stairs-Rails: None Entrance Stairs-Number of Steps: 2 Home Layout: One level Home Equipment: None      Prior Function Level of Independence: Independent               Hand Dominance   Dominant Hand: Right    Extremity/Trunk Assessment   Upper Extremity Assessment: Generalized weakness           Lower Extremity Assessment: Generalized weakness      Cervical / Trunk Assessment: Kyphotic  Communication  Communication: No difficulties  Cognition Arousal/Alertness: Awake/alert Behavior During Therapy: WFL for tasks assessed/performed Overall Cognitive Status: No family/caregiver present to determine baseline cognitive functioning                      General Comments      Exercises     Assessment/Plan    PT Assessment  Patient needs continued PT services  PT Problem List Decreased strength;Decreased activity tolerance;Decreased balance;Decreased mobility;Decreased cognition;Decreased safety awareness;Cardiopulmonary status limiting activity          PT Treatment Interventions DME instruction;Gait training;Stair training;Functional mobility training;Therapeutic activities;Therapeutic exercise;Balance training;Patient/family education    PT Goals (Current goals can be found in the Care Plan section)  Acute Rehab PT Goals Patient Stated Goal: to go home PT Goal Formulation: With patient Time For Goal Achievement: 03/18/16 Potential to Achieve Goals: Good    Frequency Min 3X/week   Barriers to discharge        Co-evaluation               End of Session Equipment Utilized During Treatment: Gait belt Activity Tolerance: Patient tolerated treatment well;Patient limited by fatigue Patient left: in chair;with call bell/phone within reach;with chair alarm set Nurse Communication: Mobility status         Time: [id number] PT Time Calculation (min) (ACUTE ONLY): 23 min   Charges:   PT Evaluation $PT Eval Moderate Complexity: 1 Procedure     PT G CodesDuncan Dull 03/24/16, 5:22 PM Alben Deeds, [place] DPT  [phone]

## 2016-03-04 NOTE — Progress Notes (Signed)
ANTICOAGULATION CONSULT NOTE - Follow-up  Pharmacy Consult for heparin Indication: atrial fibrillation  Allergies  Allergen Reactions  . Clindamycin/Lincomycin Rash and Other (See Comments)    Heart problems     Patient Measurements: Height: 5\' 2"  (157.5 cm) Weight: 113 lb 3.2 oz (51.3 kg) IBW/kg (Calculated) : 50.1 Heparin Dosing Weight: 59kg  Vital Signs: Temp: 98.4 F (36.9 C) (10/14 0500) BP: 167/60 (10/14 0500) Pulse Rate: 55 (10/14 0500)  Labs:  Recent Labs  03/02/16 1000 03/02/16 1005  03/02/16 2144 03/02/16 2145 03/03/16 0120 03/03/16 0621 03/03/16 1229 03/04/16 0526  HGB 12.3 12.9  --   --   --   --  11.5*  --  11.7*  HCT 37.6 38.0  --   --   --   --  35.5*  --  36.2  PLT 194  --   --   --   --   --  218  --  195  HEPARINUNFRC  --   --   --  0.57  --   --  0.77*  --  0.53  CREATININE 1.31* 1.20*  --   --   --   --  1.33*  --  1.20*  CKTOTAL  --   --   --   --  304*  --   --   --   --   TROPONINI  --   --   < >  --   --  6.11* 5.36* 5.24*  --   < > = values in this interval not displayed.  Estimated Creatinine Clearance: 26.6 mL/min (by C-G formula based on SCr of 1.2 mg/dL (H)).   Medical History: Past Medical History:  Diagnosis Date  . Aortic valve disorder   . Chronic diastolic heart failure ([place])   . Constipation   . Coronary artery disease   . Dementia   . Diabetes mellitus without complication ([place])   . GERD (gastroesophageal reflux disease)   . Hyperlipidemia   . Hypertensive heart disease without CHF   . Peripheral neuropathy ([place])   . Peripheral vascular disease (HCC)     Medications:  Infusions:  . heparin 700 Units/hr (03/03/16 1501)    Assessment: 29 yoF presented to the ED with CP and elevated troponins, started on heparin for NSTEMI/afib. Per notes- no plans for cath or stress test and not a candidate for oral anticoagulation. Anti-Xa level therapeutic today at 0.53 after dose-reduction. Hgb stable, plt 195. Per primary MD,  continue heparin for now pending their discussion with cards.   Goal of Therapy:  Heparin level 0.3-0.7 units/ml Monitor platelets by anticoagulation protocol: Yes   Plan:  -Continue heparin 700 units/hr -Daily anti-Xa, CBC, S/Sx bleeding -Follow-up length of therapy for heparin  Arrie Senate, PharmD PGY-1 Pharmacy Resident Pager: [phone] 03/04/2016

## 2016-03-04 NOTE — Progress Notes (Signed)
Patient Name: [name] Date of Encounter: 03/04/2016  Primary Cardiologist: [facility] Problem List     Principal Problem:   Chest pain Active Problems:   Type 2 diabetes mellitus with vascular disease (HCC)   Dementia   CAD (coronary artery disease)   Hypertensive heart disease without CHF   Chronic diastolic heart failure (HCC)   Paroxysmal atrial fibrillation ([place])     Subjective   Says she has some chest pain and SOB. Lying comfortably in bed. Asking where her meds are. Has dementia.  Inpatient Medications    Scheduled Meds: . amLODipine  5 mg Oral BID PC  . aspirin EC  325 mg Oral Daily  . atorvastatin  20 mg Oral q1800  . clopidogrel  75 mg Oral Daily  . ezetimibe  10 mg Oral Daily  . feeding supplement (ENSURE ENLIVE)  237 mL Oral BID BM  . hydrALAZINE  50 mg Oral Q8H  . isosorbide mononitrate  60 mg Oral Daily  . memantine  10 mg Oral BID  . metoprolol  25 mg Oral BID  . nitroGLYCERIN  0.5 inch Topical Q6H   Continuous Infusions: . heparin 700 Units/hr (03/03/16 1501)   PRN Meds: acetaminophen, hydrALAZINE, ondansetron (ZOFRAN) IV, zolpidem   Vital Signs    Vitals:   03/04/16 0500 03/04/16 0737 03/04/16 0746 03/04/16 1213  BP: (!) 167/60  (!) 203/93 (!) 143/67  Pulse: (!) 55 (!) 52 75 (!) 56  Resp: 13 12 20  (!) 22  Temp: 98.4 F (36.9 C)   98.7 F (37.1 C)  TempSrc:    Oral  SpO2: 98% 96% 98% 97%  Weight:      Height:        Intake/Output Summary (Last 24 hours) at 03/04/16 1350 Last data filed at 03/04/16 1214  Gross per 24 hour  Intake          1093.76 ml  Output              800 ml  Net           293.76 ml   Filed Weights   03/02/16 2029 03/03/16 0533 03/04/16 0300  Weight: 112 lb 8 oz (51 kg) 113 lb (51.3 kg) 113 lb 3.2 oz (51.3 kg)    Physical Exam    GEN: Well nourished, well developed, in no acute distress.  HEENT: Grossly normal.  Neck: Supple, no JVD, carotid bruits, or masses. Cardiac: RRR, 2/6 systolic  murmur at b/l USB's, no rubs or gallops. No clubbing, cyanosis, edema.   Respiratory:  Respirations regular and unlabored, clear to auscultation bilaterally. GI: Soft, nontender, nondistended. MS: no deformity or atrophy. Skin: warm and dry, no rash. Neuro:  Strength and sensation are intact. Psych: Normal affect.  Labs    CBC  Recent Labs  03/02/16 1000  03/03/16 0621 03/04/16 0526  WBC 7.0  --  6.6 6.7  NEUTROABS 5.4  --  4.1  --   HGB 12.3  < > 11.5* 11.7*  HCT 37.6  < > 35.5* 36.2  MCV 90.0  --  90.8 90.3  PLT 194  --  218 195  < > = values in this interval not displayed. Basic Metabolic Panel  Recent Labs  03/03/16 0621 03/04/16 0526  NA 141 138  K 4.5 3.8  CL 107 108  CO2 26 22  GLUCOSE 113* 106*  BUN 13 17  CREATININE 1.33* 1.20*  CALCIUM 9.3 9.2  MG  --  2.0  PHOS  --  3.4   Liver Function Tests  Recent Labs  03/03/16 0621  AST 85*  ALT 62*  ALKPHOS 57  BILITOT 0.7  PROT 5.9*  ALBUMIN 3.6   No results for input(s): LIPASE, AMYLASE in the last 72 hours. Cardiac Enzymes  Recent Labs  03/02/16 2145 03/03/16 0120 03/03/16 0621 03/03/16 1229  CKTOTAL 304*  --   --   --   TROPONINI  --  6.11* 5.36* 5.24*   BNP Invalid input(s): POCBNP D-Dimer No results for input(s): DDIMER in the last 72 hours. Hemoglobin A1C No results for input(s): HGBA1C in the last 72 hours. Fasting Lipid Panel No results for input(s): CHOL, HDL, LDLCALC, TRIG, CHOLHDL, LDLDIRECT in the last 72 hours. Thyroid Function Tests  Recent Labs  03/02/16 1000  TSH 2.162    Telemetry    Personally Reviewed: NSR/sinus brady  ECG    Personally Reviewed  Radiology    Ct Head Wo Contrast  Result Date: 03/02/2016 CLINICAL DATA:  82 year old female post recurrent falls at home. Dementia. Initial encounter. EXAM: CT HEAD WITHOUT CONTRAST TECHNIQUE: Contiguous axial images were obtained from the base of the skull through the vertex without intravenous contrast.  COMPARISON:  05/23/2012 head CT. FINDINGS: Brain: No intracranial hemorrhage or CT evidence of large acute infarct Mild atrophy most notable temporal lobes. Mild chronic microvascular changes. No intracranial mass lesion noted on this unenhanced exam. Partially empty expanded sella without change and felt to be an incidental finding. Vascular: Vascular calcifications Skull: No skull fracture.  Congenital fusion C1/ occiput. Sinuses/Orbits: No acute orbital abnormality. Post right lens replacement. Mild polypoid opacification lateral aspect left maxillary sinus. Other: Negative IMPRESSION: No skull fracture or intracranial hemorrhage. Mild atrophy most notable temporal lobes. Congenital fusion occiput and C1 incidentally noted with narrowing of the foramen magnum unchanged. Electronically Signed   By: Genia Del M.D.   On: 03/02/2016 20:18    Cardiac Studies   - Left ventricle: The cavity size was normal. There was moderate   concentric hypertrophy. Systolic function was mildly reduced. The   estimated ejection fraction was in the range of 45% to 50%.   Regional wall motion abnormalities cannot be excluded. - Aortic valve: Transvalvular velocity was minimally increased.   There was very mild stenosis. There was mild regurgitation. Valve   area (VTI): 1.39 cm^2. Valve area (Vmax): 1.22 cm^2. Valve area   (Vmean): 1.17 cm^2. - Left atrium: The atrium was mildly dilated.  Patient Profile   See consult note   Assessment & Plan    1.  Non-STEMI may be due to recent atrial fibrillation with rapid response 2.  Severe underlying dementia 3.  CAD with previous coronary bypass grafting on 4 separate occasions as well as previous left main stent 4.  Hypertensive heart disease above goal  Recommendations:  As per Dr. Wynonia Lawman, would not do any stress testing on her.  Not considered to be a candidate for further cardiac interventions due to her significant dementia.  Intensify medical therapy  including good blood pressure control.  The dementia makes the history quite difficult.  The major issue that needs to be addressed is the safety of her being able to stay in in her own home alone.    Will increase Imdur to 90 mg. Not considered to be a good candidate for anticoagulation for atrial fibrillation in light of her dementia and social situation.  Signed, Kate Sable, MD  03/04/2016, 1:50 PM

## 2016-03-05 DIAGNOSIS — I5043 Acute on chronic combined systolic (congestive) and diastolic (congestive) heart failure: Secondary | ICD-10-CM

## 2016-03-05 DIAGNOSIS — I2510 Atherosclerotic heart disease of native coronary artery without angina pectoris: Secondary | ICD-10-CM

## 2016-03-05 DIAGNOSIS — I4891 Unspecified atrial fibrillation: Secondary | ICD-10-CM

## 2016-03-05 LAB — BASIC METABOLIC PANEL WITH GFR
Anion gap: 11 (ref 5–15)
BUN: 19 mg/dL (ref 6–20)
CO2: 20 mmol/L — ABNORMAL LOW (ref 22–32)
Calcium: 9.3 mg/dL (ref 8.9–10.3)
Chloride: 107 mmol/L (ref 101–111)
Creatinine, Ser: 1.39 mg/dL — ABNORMAL HIGH (ref 0.44–1.00)
GFR calc Af Amer: 39 mL/min — ABNORMAL LOW
GFR calc non Af Amer: 33 mL/min — ABNORMAL LOW
Glucose, Bld: 152 mg/dL — ABNORMAL HIGH (ref 65–99)
Potassium: 3.8 mmol/L (ref 3.5–5.1)
Sodium: 138 mmol/L (ref 135–145)

## 2016-03-05 LAB — CBC
HCT: 38.8 % (ref 36.0–46.0)
Hemoglobin: 12.6 g/dL (ref 12.0–15.0)
MCH: 29.2 pg (ref 26.0–34.0)
MCHC: 32.5 g/dL (ref 30.0–36.0)
MCV: 89.8 fL (ref 78.0–100.0)
Platelets: 126 10*3/uL — ABNORMAL LOW (ref 150–400)
RBC: 4.32 MIL/uL (ref 3.87–5.11)
RDW: 13.7 % (ref 11.5–15.5)
WBC: 5.9 10*3/uL (ref 4.0–10.5)

## 2016-03-05 LAB — HEPARIN LEVEL (UNFRACTIONATED): Heparin Unfractionated: <0.1 IU/mL — ABNORMAL LOW (ref 0.30–0.70)

## 2016-03-05 NOTE — Progress Notes (Signed)
SUBJECTIVE: Has back pain. Lying comfortably in bed.   ROS: Other than pertinent positives in "Subjective", all others were reviewed and found to be negative.   Intake/Output Summary (Last 24 hours) at 03/05/16 1017 Last data filed at 03/05/16 [id number]  Gross per 24 hour  Intake              460 ml  Output              900 ml  Net             -440 ml    Current Facility-Administered Medications  Medication Dose Route Frequency Provider Last Rate Last Dose  . acetaminophen (TYLENOL) tablet 650 mg  650 mg Oral Q4H PRN Lavina Hamman, MD   650 mg at 03/05/16 1002  . amLODipine (NORVASC) tablet 5 mg  5 mg Oral BID PC Jacolyn Reedy, MD   5 mg at 03/05/16 1004  . aspirin EC tablet 325 mg  325 mg Oral Daily Lavina Hamman, MD   325 mg at 03/05/16 1005  . atorvastatin (LIPITOR) tablet 20 mg  20 mg Oral q1800 [facility], DO   20 mg at 03/04/16 1829  . clopidogrel (PLAVIX) tablet 75 mg  75 mg Oral Daily Lavina Hamman, MD   75 mg at 03/05/16 1005  . ezetimibe (ZETIA) tablet 10 mg  10 mg Oral Daily Lavina Hamman, MD   10 mg at 03/05/16 1005  . feeding supplement (ENSURE ENLIVE) (ENSURE ENLIVE) liquid 237 mL  237 mL Oral BID BM Lavina Hamman, MD   237 mL at 03/03/16 1525  . hydrALAZINE (APRESOLINE) injection 10 mg  10 mg Intravenous Q4H PRN Lavina Hamman, MD   10 mg at 03/02/16 1912  . hydrALAZINE (APRESOLINE) tablet 50 mg  50 mg Oral Q8H Lavina Hamman, MD   50 mg at 03/05/16 0516  . isosorbide mononitrate (IMDUR) 24 hr tablet 90 mg  90 mg Oral Daily Herminio Commons, MD   90 mg at 03/05/16 1004  . memantine (NAMENDA) tablet 10 mg  10 mg Oral BID Lavina Hamman, MD   10 mg at 03/05/16 1004  . metoprolol tartrate (LOPRESSOR) tablet 25 mg  25 mg Oral BID Gardiner Barefoot, NP   25 mg at 03/05/16 1004  . nitroGLYCERIN (NITROGLYN) 2 % ointment 0.5 inch  0.5 inch Topical Q6H Gardiner Barefoot, NP   0.5 inch at 03/04/16 1231  . ondansetron (ZOFRAN) injection 4 mg  4 mg  Intravenous Q6H PRN Lavina Hamman, MD      . zolpidem [facility]) tablet 5 mg  5 mg Oral QHS PRN Lavina Hamman, MD   5 mg at 03/04/16 2136    Vitals:   03/05/16 0012 03/05/16 0516 03/05/16 0612 03/05/16 0829  BP: (!) 141/61 (!) 163/58 (!) 157/57 (!) 173/46  Pulse: (!) 54  (!) 54 63  Resp: 15  12   Temp: 97.6 F (36.4 C)  97.8 F (36.6 C) 97.6 F (36.4 C)  TempSrc:    Oral  SpO2: 99%  100% 100%  Weight:   113 lb (51.3 kg)   Height:        PHYSICAL EXAM GEN: Well nourished, well developed, in no acute distress.  HEENT: Grossly normal.  Neck: Supple, no JVD, carotid bruits, or masses. Cardiac: RRR, 2/6 systolic murmur at b/l USB's, no rubs or gallops. No clubbing, cyanosis, edema.  Respiratory:  Respirations regular and unlabored, clear to auscultation bilaterally. GI: Soft, nontender, nondistended. MS: no deformity or atrophy. Skin: warm and dry, no rash. Neuro:  Grossly nonfocal. Psych: Normal affect.  LABS: Basic Metabolic Panel:  Recent Labs  03/04/16 0526 03/05/16 0848  NA 138 138  K 3.8 3.8  CL 108 107  CO2 22 20*  GLUCOSE 106* 152*  BUN 17 19  CREATININE 1.20* 1.39*  CALCIUM 9.2 9.3  MG 2.0  --   PHOS 3.4  --    Liver Function Tests:  Recent Labs  03/03/16 0621  AST 85*  ALT 62*  ALKPHOS 57  BILITOT 0.7  PROT 5.9*  ALBUMIN 3.6   No results for input(s): LIPASE, AMYLASE in the last 72 hours. CBC:  Recent Labs  03/03/16 0621 03/04/16 0526 03/05/16 0619  WBC 6.6 6.7 5.9  NEUTROABS 4.1  --   --   HGB 11.5* 11.7* 12.6  HCT 35.5* 36.2 38.8  MCV 90.8 90.3 89.8  PLT 218 195 126*   Cardiac Enzymes:  Recent Labs  03/02/16 2145 03/03/16 0120 03/03/16 0621 03/03/16 1229  CKTOTAL 304*  --   --   --   TROPONINI  --  6.11* 5.36* 5.24*   BNP: Invalid input(s): POCBNP D-Dimer: No results for input(s): DDIMER in the last 72 hours. Hemoglobin A1C: No results for input(s): HGBA1C in the last 72 hours. Fasting Lipid Panel: No results for  input(s): CHOL, HDL, LDLCALC, TRIG, CHOLHDL, LDLDIRECT in the last 72 hours. Thyroid Function Tests: No results for input(s): TSH, T4TOTAL, T3FREE, THYROIDAB in the last 72 hours.  Invalid input(s): FREET3 Anemia Panel: No results for input(s): VITAMINB12, FOLATE, FERRITIN, TIBC, IRON, RETICCTPCT in the last 72 hours.  RADIOLOGY: Ct Head Wo Contrast  Result Date: 03/02/2016 CLINICAL DATA:  82 year old female post recurrent falls at home. Dementia. Initial encounter. EXAM: CT HEAD WITHOUT CONTRAST TECHNIQUE: Contiguous axial images were obtained from the base of the skull through the vertex without intravenous contrast. COMPARISON:  05/23/2012 head CT. FINDINGS: Brain: No intracranial hemorrhage or CT evidence of large acute infarct Mild atrophy most notable temporal lobes. Mild chronic microvascular changes. No intracranial mass lesion noted on this unenhanced exam. Partially empty expanded sella without change and felt to be an incidental finding. Vascular: Vascular calcifications Skull: No skull fracture.  Congenital fusion C1/ occiput. Sinuses/Orbits: No acute orbital abnormality. Post right lens replacement. Mild polypoid opacification lateral aspect left maxillary sinus. Other: Negative IMPRESSION: No skull fracture or intracranial hemorrhage. Mild atrophy most notable temporal lobes. Congenital fusion occiput and C1 incidentally noted with narrowing of the foramen magnum unchanged. Electronically Signed   By: Genia Del M.D.   On: 03/02/2016 20:18   Dg Chest Portable 1 View  Result Date: 03/02/2016 CLINICAL DATA:  Acute chest pain and tachycardia. EXAM: PORTABLE CHEST 1 VIEW COMPARISON:  05/23/2012 and prior radiographs FINDINGS: Cardiomegaly, CABG changes and breast prostheses again noted. There is no evidence of focal airspace disease, pulmonary edema, suspicious pulmonary nodule/mass, pleural effusion, or pneumothorax. No acute bony abnormalities are identified. IMPRESSION: Cardiomegaly  without acute cardiopulmonary disease Electronically Signed   By: Margarette Canada M.D.   On: 03/02/2016 09:54      ASSESSMENT AND PLAN: 1. Non-STEMI may be due to recent atrial fibrillation with rapid response 2. Severe underlying dementia 3. CAD with previous coronary bypass grafting on 4 separate occasions as well as previous left main stent 4. Hypertensive heart disease above goal  Recommendations:  As per  Dr. Wynonia Lawman, would not do any stress testing on her. Not considered to be a candidate for further cardiac interventions due to her significant dementia. Intensify medical therapy including good blood pressure control. Elevated today with back pain. The dementia makes the history quite difficult. The major issue that needs to be addressed is the safety of her being able to stay in in her own home alone. Hospitalist is working with social work on this.  Continue Imdur 90 mg. Not considered to be a good candidate for anticoagulation for atrial fibrillation in light of her dementia and social situation.   Kate Sable, M.D., F.A.C.C.

## 2016-03-05 NOTE — Clinical Social Work Note (Signed)
Clinical Social Work Assessment  Patient Details  Name: [name] MRN: [id number] Date of Birth: 1927-03-31  Date of referral:  03/05/16               Reason for consult:  Discharge Planning                Permission sought to share information with:  Chartered certified accountant granted to share information::  Yes, Verbal Permission Granted  Name::        Agency::     Relationship::     Contact Information:     Housing/Transportation Living arrangements for the past 2 months:  Single Family Home Source of Information:  Patient, Adult Children Haematologist) Patient Interpreter Needed:  None Criminal Activity/Legal Involvement Pertinent to Current Situation/Hospitalization:    Significant Relationships:  Adult Children Lives with:  Self Do you feel safe going back to the place where you live?  No Need for family participation in patient care:  Yes (Comment)  Care giving concerns:  CSW received consult to speak with patient and patients family regarding discharge plans.    Social Worker assessment / plan:  CSW spoke with patients son, [name], regarding discharge plans. Patients son would like patient to receive short-term rehab before returning home. Patient currently lives home alone. Patients family would prefer [facility] however was unfamiliar with facilities. Patients family asked questions regarding [facility] guidelines and if patient has had a 3 night stay. CSW will fax information to short-term rehabs in the area.   Employment status:  Retired Forensic scientist:  Medicare PT Recommendations:  [place] / Referral to community resources:     Patient/Family's Response to care:  Patients family appreciated CSW.   Patient/Family's Understanding of and Emotional Response to Diagnosis, Current Treatment, and Prognosis:  Patients family understood treatment and current diagnosis.   Emotional Assessment Appearance:   Appears older than stated age Attitude/Demeanor/Rapport:    Affect (typically observed):  Pleasant, Calm, Accepting Orientation:  Oriented to Place, Oriented to Self Alcohol / Substance use:    Psych involvement (Current and /or in the community):  No (Comment)  Discharge Needs  Concerns to be addressed:  No discharge needs identified Readmission within the last 30 days:  No Current discharge risk:  None Barriers to Discharge:  No Barriers Identified   Weston Anna, LCSW 03/05/2016, 5:16 PM

## 2016-03-05 NOTE — Progress Notes (Signed)
PROGRESS NOTE    [name]  [id number] DOB: 1926/10/14 DOA: 03/02/2016 PCP: Mayra Neer, MD   Brief Narrative: The patient is an 82 yo Female with a PMH of CAD s/p CABG, HTN, HLD, PVD, Severe Dementia, and CKD who presented to EMS with complaints of a Fall. She complained of "back pain" which was concerning for her because it was consistent with chest pain. She was worked up and admitted and found to have an NSTEMI and Cardiology was consulted for further recommendations. Patient being Medically Treated for NSTEMI. PT evaluated patient and they recommend SNF placement and Social Work has been sending referrals to different facilities.   Assessment & Plan:   Principal Problem:   Chest pain Active Problems:   Type 2 diabetes mellitus with vascular disease (HCC)   Dementia   CAD (coronary artery disease)   Hypertensive heart disease without CHF   Chronic diastolic heart failure (HCC)   Paroxysmal atrial fibrillation (HCC)   Atrial fibrillation with rapid ventricular response ([place])   NSTEMI (non-ST elevated myocardial infarction) ([place])   Fall  1. NSTEMI likely to Atrial Fibrillation with RVR -The patient presented with complains of back pain which she mentions is equivalent for her to have 'heart pain' her initial EKG and troponin does not show any signs of acute ischemia, but it does show diffuse ST depression consistent with any localized area. -Troponins peaked around 9.41 and Trended down.  -Cardiology Dr. Lin Landsman and recommended Medical Management only given patients Dementia - ASA 325 mg, Atorvastatin 20 mg, Plavix 75 mg, Metoprolol 25 mg po BID, and Nitroglycerin SL -*Cardiology increased Imdur to 90 mg.  -ECHO showed Left ventricle: The cavity size was normal. There was moderate concentric hypertrophy. Systolic function was mildly reduced. The estimated ejection fraction was in the range of 45% to 50%. Regional wall motion abnormalities cannot be  excluded.- Aortic valve: Transvalvular velocity was minimally increased. There was very mild stenosis. There was mild regurgitation. Valve  area (VTI): 1.39 cm^2. Valve area (Vmax): 1.22 cm^2. Valve area  (Vmean): 1.17 cm^2.- Left atrium: The atrium was mildly dilated. -D/C'd Heparin gtt -Appreciate Additional Cardiac Recc's  2. Recent Fall -It was discussed with the son if she even had a fall because per Son patient fabricates stories because of her Dementia and calls EMS for different complaints.  -CT scan showed No skull fracture or intracranial hemorrhage. Mild atrophy most notable temporal lobes.Congenital fusion occiput and C1 incidentally noted with narrowing of the foramen magnum unchanged. -PT evaluation and Treat recommended SNF placement. -Social Work Consulted for [employer] and working to find [facility]. Referrals to SNF Sent out.  -CK total was 304.  3. Advanced Dementia. Continue Namenda 10 mg  4. Atrial Fibrillation with Resultant Sinus Loletha Grayer after administration of Cardizem -CHADS2VASC was 6 -Continue beta blocker at present. HR ranged from 52-75 -C/w Telemetry Monitoring. -Not a good Candidate for Anticoagulation per Cardiology because of Dementia and Social Living Situation.   5.Hypertension -Possibly 2/2 to Pain; Treat pain with Acetaminophen. Avoiding  -Continue Hydralazine 50 mg po q8H, Metoprolol 25 mg po BID. Amlodipine changed to 5 mg BID yesterday by Cardiology. -Cardiology increased Imdur to 90 mg.   6. Hx of CAD with CABG x4 and Left Main Stent -As in #1  7. Hyperlipidemia -Continue  Atorvastatin 20 mg po qHS and Ezetimibe 10 mg po Daily  8. Hx of Chronic Diastolic Heart Failure now combined with Systolic Dysfunction -Last EF was 55-60% but showed Grade 3  Diastolic Dysfunction -ECHO showed Left ventricle: The cavity size was normal. There was moderate concentric hypertrophy. Systolic function was mildly reduced. The estimated ejection fraction was in the  range of 45% to 50%. Regional wall motion abnormalities cannot be excluded.- Aortic valve: Transvalvular velocity was minimally increased. There was very mild stenosis. There was mild regurgitation. Valve  area (VTI): 1.39 cm^2. Valve area (Vmax): 1.22 cm^2. Valve area  (Vmean): 1.17 cm^2.- Left atrium: The atrium was mildly dilated.  DVT prophylaxis: Heparin gtt Discontinued. SCD's  Code Status: DNR Family Communication: Discussed Plan of Care with Patient's Son and Daughter In-Law and they agree with the plan of care.   Disposition Plan: **SNF pending Bed Availability.   Consultants:   Cardiology   Procedures: ECHO  Antimicrobials: None  Subjective: Patient was seen and examined and stated she again was having back pain. EKG was obtained and did not show any ST Elevation. Had no CP/SOB/N/V. Stated she slept like a rock last night. No other complaints or concerns at this time.   Objective: Vitals:   03/05/16 1020 03/05/16 1304 03/05/16 1415 03/05/16 1701  BP:  (!) 131/41 (!) 135/49 (!) 134/41  Pulse: 62 (!) 56 (!) 52 60  Resp: 18  15   Temp:  98.5 F (36.9 C)  98.7 F (37.1 C)  TempSrc:  Oral  Oral  SpO2: 100% 100% 100% 100%  Weight:      Height:        Intake/Output Summary (Last 24 hours) at 03/05/16 1846 Last data filed at 03/05/16 1200  Gross per 24 hour  Intake              220 ml  Output             1100 ml  Net             -880 ml   Filed Weights   03/03/16 0533 03/04/16 0300 03/05/16 0612  Weight: 51.3 kg (113 lb) 51.3 kg (113 lb 3.2 oz) 51.3 kg (113 lb)    Examination: Physical Exam:  Constitutional: Thin elderly female, NAD and appears calm and comfortable Eyes: Lids and conjunctivae normal, sclerae anicteric  ENMT: External Ears, Nose appear normal. Grossly normal hearing. Missing teeth and poor dentition. Neck: Appears normal, supple, no cervical masses, normal ROM, no appreciable thyromegaly, mild JVD. Respiratory: Clear to auscultation bilaterally,  no wheezing, rales, rhonchi or crackles. Normal respiratory effort and patient is not tachypenic. No accessory muscle use.  Cardiovascular: RRR, 2/6 Systolic Murmur. No rubs / gallops. S1 and S2 auscultated. No extremity edema. 2+ pedal pulses. No carotid bruits.  Abdomen: Soft, non-tender, non-distended. No masses palpated. No appreciable hepatosplenomegaly. Bowel sounds positive.  GU: Deferred. Musculoskeletal: No clubbing / cyanosis of digits/nails. No joint deformity upper and lower extremities. No contractures. Normal strength and muscle tone.  Skin: No rashes, lesions, ulcers. No induration; Warm and dry.  Neurologic: CN 2-12 grossly intact with no focal deficits. Sensation intact in all 4 Extremities.  Romberg sign cerebellar reflexes not assessed.  Psychiatric: Difficult to asssess judgment and insight. Alert and awake. Normal mood and pleasant and appropriate affect.   Data Reviewed: I have personally reviewed following labs and imaging studies  CBC:  Recent Labs Lab 03/02/16 1000 03/02/16 1005 03/03/16 0621 03/04/16 0526 03/05/16 0619  WBC 7.0  --  6.6 6.7 5.9  NEUTROABS 5.4  --  4.1  --   --   HGB 12.3 12.9 11.5* 11.7* 12.6  HCT 37.6  38.0 35.5* 36.2 38.8  MCV 90.0  --  90.8 90.3 89.8  PLT 194  --  218 195 [id number]*   Basic Metabolic Panel:  Recent Labs Lab 03/02/16 1000 03/02/16 1005 03/03/16 0621 03/04/16 0526 03/05/16 0848  NA 139 141 141 138 138  K 3.4* 3.4* 4.5 3.8 3.8  CL 108 107 107 108 107  CO2 23  --  26 22 20*  GLUCOSE 125* 124* 113* 106* 152*  BUN 13 17 13 17 19   CREATININE 1.31* 1.20* 1.33* 1.20* 1.39*  CALCIUM 9.5  --  9.3 9.2 9.3  MG  --   --   --  2.0  --   PHOS  --   --   --  3.4  --    GFR: Estimated Creatinine Clearance: 23 mL/min (by C-G formula based on SCr of 1.39 mg/dL (H)). Liver Function Tests:  Recent Labs Lab 03/03/16 0621  AST 85*  ALT 62*  ALKPHOS 57  BILITOT 0.7  PROT 5.9*  ALBUMIN 3.6   No results for input(s): LIPASE,  AMYLASE in the last 168 hours. No results for input(s): AMMONIA in the last 168 hours. Coagulation Profile: No results for input(s): INR, PROTIME in the last 168 hours. Cardiac Enzymes:  Recent Labs Lab 03/02/16 1750 03/02/16 2145 03/03/16 0120 03/03/16 0621 03/03/16 1229  CKTOTAL  --  304*  --   --   --   TROPONINI 9.41*  --  6.11* 5.36* 5.24*   BNP (last 3 results) No results for input(s): PROBNP in the last 8760 hours. HbA1C: No results for input(s): HGBA1C in the last 72 hours. CBG: No results for input(s): GLUCAP in the last 168 hours. Lipid Profile: No results for input(s): CHOL, HDL, LDLCALC, TRIG, CHOLHDL, LDLDIRECT in the last 72 hours. Thyroid Function Tests: No results for input(s): TSH, T4TOTAL, FREET4, T3FREE, THYROIDAB in the last 72 hours. Anemia Panel: No results for input(s): VITAMINB12, FOLATE, FERRITIN, TIBC, IRON, RETICCTPCT in the last 72 hours. Sepsis Labs: No results for input(s): PROCALCITON, LATICACIDVEN in the last 168 hours.  Recent Results (from the past 240 hour(s))  MRSA PCR Screening     Status: None   Collection Time: 03/02/16  8:35 PM  Result Value Ref Range Status   MRSA by PCR NEGATIVE NEGATIVE Final    Comment:        The GeneXpert MRSA Assay (FDA approved for NASAL specimens only), is one component of a comprehensive MRSA colonization surveillance program. It is not intended to diagnose MRSA infection nor to guide or monitor treatment for MRSA infections.     Radiology Studies: No results found. ECHOCARDIOGRAM Study Conclusions  - Left ventricle: The cavity size was normal. There was moderate   concentric hypertrophy. Systolic function was mildly reduced. The   estimated ejection fraction was in the range of 45% to 50%.   Regional wall motion abnormalities cannot be excluded. - Aortic valve: Transvalvular velocity was minimally increased.   There was very mild stenosis. There was mild regurgitation. Valve   area (VTI):  1.39 cm^2. Valve area (Vmax): 1.22 cm^2. Valve area   (Vmean): 1.17 cm^2. - Left atrium: The atrium was mildly dilated.  Scheduled Meds: . amLODipine  5 mg Oral BID PC  . aspirin EC  325 mg Oral Daily  . atorvastatin  20 mg Oral q1800  . clopidogrel  75 mg Oral Daily  . ezetimibe  10 mg Oral Daily  . feeding supplement (ENSURE ENLIVE)  237 mL Oral  BID BM  . hydrALAZINE  50 mg Oral Q8H  . isosorbide mononitrate  90 mg Oral Daily  . memantine  10 mg Oral BID  . metoprolol  25 mg Oral BID  . nitroGLYCERIN  0.5 inch Topical Q6H   Continuous Infusions:    LOS: 3 days   Kerney Elbe, DO [facility] Pager [phone]  If 7PM-7AM, please contact night-coverage [email or web address] Password [facility] 03/05/2016, 6:46 PM

## 2016-03-05 NOTE — NC FL2 (Signed)
[place] MEDICAID FL2 LEVEL OF CARE SCREENING TOOL     IDENTIFICATION  Patient Name: [name] Birthdate: 05-Oct-1926 Sex: female Admission Date (Current Location): 03/02/2016  [facility] and [place] Number:  Herbalist and Address:  [facility] [place], [place] [address] [place], [place] [address]      Provider Number: [id number]  Attending Physician Name and Address:  Kerney Elbe, DO  Relative Name and Phone Number:       Current Level of Care: Hospital Recommended Level of Care: [place] Prior Approval Number:    Date Approved/Denied:   PASRR Number:   [id number]   Discharge Plan: SNF    Current Diagnoses: Patient Active Problem List   Diagnosis Date Noted  . Atrial fibrillation with rapid ventricular response ([place])   . NSTEMI (non-ST elevated myocardial infarction) ([place])   . Fall   . Paroxysmal atrial fibrillation ([place]) 03/02/2016  . Chest pain 03/02/2016  . Type 2 diabetes mellitus with vascular disease ([place])   . Dementia   . MI (myocardial infarction)   . CAD (coronary artery disease)   . Hypertensive heart disease without CHF   . Hyperlipidemia   . Peripheral vascular disease ([place])   . Chronic diastolic heart failure ([place])   . Aortic valve disorder   . GERD (gastroesophageal reflux disease)     Orientation RESPIRATION BLADDER Height & Weight     Place, Situation  Normal Continent Weight: 113 lb (51.3 kg) Height:  5\' 2"  (157.5 cm)  BEHAVIORAL SYMPTOMS/MOOD NEUROLOGICAL BOWEL NUTRITION STATUS      Continent Diet (Heart Healthy)  AMBULATORY STATUS COMMUNICATION OF NEEDS Skin   Limited Assist Verbally Normal                       Personal Care Assistance Level of Assistance  Bathing, Dressing Bathing Assistance: Limited assistance   Dressing Assistance: Limited assistance     Functional Limitations Info             SPECIAL CARE FACTORS FREQUENCY  PT (By licensed PT)     PT  Frequency: 5 OT Frequency: 5            Contractures      Additional Factors Info  Code Status, Allergies Code Status Info: DNR Allergies Info: CLINDAMYCIN/LINCOMYCIN            Current Medications (03/05/2016):  This is the current hospital active medication list Current Facility-Administered Medications  Medication Dose Route Frequency Provider Last Rate Last Dose  . acetaminophen (TYLENOL) tablet 650 mg  650 mg Oral Q4H PRN Lavina Hamman, MD   650 mg at 03/05/16 1002  . amLODipine (NORVASC) tablet 5 mg  5 mg Oral BID PC Jacolyn Reedy, MD   5 mg at 03/05/16 1004  . aspirin EC tablet 325 mg  325 mg Oral Daily Lavina Hamman, MD   325 mg at 03/05/16 1005  . atorvastatin (LIPITOR) tablet 20 mg  20 mg Oral q1800 [facility], DO   20 mg at 03/04/16 1829  . clopidogrel (PLAVIX) tablet 75 mg  75 mg Oral Daily Lavina Hamman, MD   75 mg at 03/05/16 1005  . ezetimibe (ZETIA) tablet 10 mg  10 mg Oral Daily Lavina Hamman, MD   10 mg at 03/05/16 1005  . feeding supplement (ENSURE ENLIVE) (ENSURE ENLIVE) liquid 237 mL  237 mL Oral BID BM Lavina Hamman,  MD   237 mL at 03/03/16 1525  . hydrALAZINE (APRESOLINE) injection 10 mg  10 mg Intravenous Q4H PRN Lavina Hamman, MD   10 mg at 03/02/16 1912  . hydrALAZINE (APRESOLINE) tablet 50 mg  50 mg Oral Q8H Lavina Hamman, MD   50 mg at 03/05/16 1415  . isosorbide mononitrate (IMDUR) 24 hr tablet 90 mg  90 mg Oral Daily Herminio Commons, MD   90 mg at 03/05/16 1004  . memantine (NAMENDA) tablet 10 mg  10 mg Oral BID Lavina Hamman, MD   10 mg at 03/05/16 1004  . metoprolol tartrate (LOPRESSOR) tablet 25 mg  25 mg Oral BID Gardiner Barefoot, NP   25 mg at 03/05/16 1004  . nitroGLYCERIN (NITROGLYN) 2 % ointment 0.5 inch  0.5 inch Topical Q6H Gardiner Barefoot, NP   0.5 inch at 03/04/16 1231  . ondansetron (ZOFRAN) injection 4 mg  4 mg Intravenous Q6H PRN Lavina Hamman, MD      . zolpidem [facility]) tablet 5 mg  5 mg Oral QHS PRN  Lavina Hamman, MD   5 mg at 03/04/16 2136     Discharge Medications: Please see discharge summary for a list of discharge medications.  Relevant Imaging Results:  Relevant Lab Results:   Additional [place], LCSW

## 2016-03-06 DIAGNOSIS — I119 Hypertensive heart disease without heart failure: Secondary | ICD-10-CM | POA: Diagnosis not present

## 2016-03-06 DIAGNOSIS — Z8572 Personal history of non-Hodgkin lymphomas: Secondary | ICD-10-CM | POA: Diagnosis not present

## 2016-03-06 DIAGNOSIS — J9610 Chronic respiratory failure, unspecified whether with hypoxia or hypercapnia: Secondary | ICD-10-CM | POA: Diagnosis not present

## 2016-03-06 DIAGNOSIS — I480 Paroxysmal atrial fibrillation: Secondary | ICD-10-CM | POA: Diagnosis not present

## 2016-03-06 DIAGNOSIS — M6281 Muscle weakness (generalized): Secondary | ICD-10-CM | POA: Diagnosis not present

## 2016-03-06 DIAGNOSIS — Z951 Presence of aortocoronary bypass graft: Secondary | ICD-10-CM | POA: Diagnosis not present

## 2016-03-06 DIAGNOSIS — I739 Peripheral vascular disease, unspecified: Secondary | ICD-10-CM | POA: Diagnosis not present

## 2016-03-06 DIAGNOSIS — D696 Thrombocytopenia, unspecified: Secondary | ICD-10-CM | POA: Diagnosis not present

## 2016-03-06 DIAGNOSIS — Z7982 Long term (current) use of aspirin: Secondary | ICD-10-CM | POA: Diagnosis not present

## 2016-03-06 DIAGNOSIS — E1151 Type 2 diabetes mellitus with diabetic peripheral angiopathy without gangrene: Secondary | ICD-10-CM | POA: Diagnosis not present

## 2016-03-06 DIAGNOSIS — Z9181 History of falling: Secondary | ICD-10-CM | POA: Diagnosis not present

## 2016-03-06 DIAGNOSIS — E785 Hyperlipidemia, unspecified: Secondary | ICD-10-CM | POA: Diagnosis not present

## 2016-03-06 DIAGNOSIS — I214 Non-ST elevation (NSTEMI) myocardial infarction: Secondary | ICD-10-CM | POA: Diagnosis not present

## 2016-03-06 DIAGNOSIS — F0390 Unspecified dementia without behavioral disturbance: Secondary | ICD-10-CM | POA: Diagnosis not present

## 2016-03-06 DIAGNOSIS — I209 Angina pectoris, unspecified: Secondary | ICD-10-CM | POA: Diagnosis not present

## 2016-03-06 DIAGNOSIS — K219 Gastro-esophageal reflux disease without esophagitis: Secondary | ICD-10-CM | POA: Diagnosis not present

## 2016-03-06 DIAGNOSIS — I5043 Acute on chronic combined systolic (congestive) and diastolic (congestive) heart failure: Secondary | ICD-10-CM | POA: Diagnosis not present

## 2016-03-06 DIAGNOSIS — I200 Unstable angina: Secondary | ICD-10-CM | POA: Diagnosis not present

## 2016-03-06 DIAGNOSIS — I4891 Unspecified atrial fibrillation: Secondary | ICD-10-CM | POA: Diagnosis not present

## 2016-03-06 DIAGNOSIS — D469 Myelodysplastic syndrome, unspecified: Secondary | ICD-10-CM | POA: Diagnosis not present

## 2016-03-06 DIAGNOSIS — I2510 Atherosclerotic heart disease of native coronary artery without angina pectoris: Secondary | ICD-10-CM | POA: Diagnosis not present

## 2016-03-06 LAB — BASIC METABOLIC PANEL
Anion gap: 12 (ref 5–15)
BUN: 22 mg/dL — AB (ref 6–20)
CALCIUM: 9.4 mg/dL (ref 8.9–10.3)
CO2: 18 mmol/L — ABNORMAL LOW (ref 22–32)
Chloride: 107 mmol/L (ref 101–111)
Creatinine, Ser: 1.33 mg/dL — ABNORMAL HIGH (ref 0.44–1.00)
GFR calc Af Amer: 41 mL/min — ABNORMAL LOW (ref >60)
GFR, EST NON AFRICAN AMERICAN: 35 mL/min — AB (ref >60)
GLUCOSE: 192 mg/dL — AB (ref 65–99)
Potassium: 4.3 mmol/L (ref 3.5–5.1)
Sodium: 137 mmol/L (ref 135–145)

## 2016-03-06 LAB — CBC
HCT: 37.1 % (ref 36.0–46.0)
Hemoglobin: 12 g/dL (ref 12.0–15.0)
MCH: 29.2 pg (ref 26.0–34.0)
MCHC: 32.3 g/dL (ref 30.0–36.0)
MCV: 90.3 fL (ref 78.0–100.0)
PLATELETS: 193 10*3/uL (ref 150–400)
RBC: 4.11 MIL/uL (ref 3.87–5.11)
RDW: 13.8 % (ref 11.5–15.5)
WBC: 6.2 10*3/uL (ref 4.0–10.5)

## 2016-03-06 MED ORDER — ISOSORBIDE MONONITRATE ER 30 MG PO TB24: 90.0000 mg | ORAL_TABLET | Freq: Every day | ORAL | 0 refills | Status: DC

## 2016-03-06 MED ORDER — HYDRALAZINE HCL 50 MG PO TABS: 50.0000 mg | ORAL_TABLET | Freq: Three times a day (TID) | ORAL | 0 refills | Status: DC

## 2016-03-06 MED ORDER — ATORVASTATIN CALCIUM 20 MG PO TABS: 20.0000 mg | ORAL_TABLET | Freq: Every day | ORAL | 0 refills | Status: DC

## 2016-03-06 MED ORDER — METOPROLOL TARTRATE 25 MG PO TABS: 25.0000 mg | ORAL_TABLET | Freq: Two times a day (BID) | ORAL | 0 refills | Status: DC

## 2016-03-06 MED ORDER — ASPIRIN 325 MG PO TBEC: 325.0000 mg | DELAYED_RELEASE_TABLET | Freq: Every day | ORAL | 0 refills | Status: DC

## 2016-03-06 MED ORDER — ENSURE ENLIVE PO LIQD: 237.0000 mL | Freq: Two times a day (BID) | ORAL | 12 refills | Status: DC

## 2016-03-06 MED ORDER — AMLODIPINE BESYLATE 5 MG PO TABS: 5.0000 mg | ORAL_TABLET | Freq: Two times a day (BID) | ORAL | 0 refills | Status: DC

## 2016-03-06 NOTE — Care Management Important Message (Signed)
Important Message  Patient Details  Name: [name] MRN: [id number] Date of Birth: [birth year]   Medicare Important Message Given:  Yes    [name] Abena 03/06/2016, 1:48 PM

## 2016-03-06 NOTE — Discharge Summary (Signed)
Physician Discharge Summary  [name] [id number] DOB: 1926/07/08 DOA: 03/02/2016  PCP: Mayra Neer, MD  Admit date: 03/02/2016 Discharge date: 03/06/2016  Admitted From: Home Disposition:  SNF  Recommendations for Outpatient Follow-up:  1. Follow up with PCP in 1-2 weeks 2. Follow up with Cardiology Dr. Wynonia Lawman in 1 week 3. Please obtain BMP/CBC in one week   Home Health: No Equipment/Devices:   Discharge Condition: Stable CODE STATUS: DNR Diet recommendation: Heart Healthy   Brief/Interim Summary: The patient is an 82 yo Female with a PMH of CAD s/p CABG, HTN, HLD, PVD, Severe Dementia, and CKD who presented to EMS with complaints of a Fall. She complained of "back pain" which was concerning for her because it was consistent with chest pain. She was worked up and admitted and found to have an NSTEMI and Cardiology was consulted for further recommendations. Patient was Medically Treated for NSTEMI as she is not a candidate for invasive procedures or stress testing. PT evaluated patient and they recommend SNF placement. Patient medically stable and able to be D/C'd to SNF today.   Discharge Diagnoses:  Principal Problem:   Chest pain Active Problems:   Type 2 diabetes mellitus with vascular disease (HCC)   Dementia   CAD (coronary artery disease)   Hypertensive heart disease without CHF   Chronic diastolic heart failure (HCC)   Paroxysmal atrial fibrillation (HCC)   Atrial fibrillation with rapid ventricular response (HCC)   NSTEMI (non-ST elevated myocardial infarction) ([place])   Fall   Acute on chronic combined systolic and diastolic CHF (congestive heart failure) ([place])  NSTEMI likely to Atrial Fibrillation with RVR -The patient presented with complains of back pain which she mentions is equivalent for her to have 'heart pain' herinitial EKG and troponin does not show any signs of acute ischemia, but it does show diffuse ST depression consistent with any  localized area. -Troponins peaked around 9.41 and Trended down.  -Cardiology Dr. Lin Landsman and recommended Medical Management only given patients Dementia - ASA 325 mg, Atorvastatin 20 mg, Plavix 75 mg, Metoprolol 25 mg po BID, and Nitroglycerin SL -*Cardiology increased Imdur to 90 mg. No ACE/ARB because of CKD. -ECHO showed Left ventricle: The cavity size was normal. There was moderate concentric hypertrophy. Systolic function was mildly reduced. The estimated ejection fraction was in the range of 45% to 50%.Regional wall motion abnormalities cannot be excluded.- Aortic valve: Transvalvular velocity was minimally increased.There was very mild stenosis. There was mild regurgitation. Valvearea (VTI): 1.39 cm^2. Valve area (Vmax): 1.22 cm^2. Valve area(Vmean): 1.17 cm^2.- Left atrium: The atrium was mildly dilated. -D/C'd Heparin gtt after 48 hours. -Appreciated Additional Cardiac Recc's; Follow up with Cardiology Dr. Wynonia Lawman as an outpatient.  2. Recent Fall -It was discussed with the son if she even had a fall because per Son patient fabricates stories because of her Dementia and calls EMS for different complaints.  -CT scan showed No skull fracture or intracranial hemorrhage. Mild atrophy most notable temporal lobes.Congenital fusion occiput and C1 incidentally noted with narrowing of the foramen magnum unchanged. -PT evaluation and Treat recommended SNF placement. -Continue PT/OT at SNF  3. AdvancedDementia. Continue Namenda 10 mg  4. Atrial Fibrillation with Resultant Sinus Loletha Grayer after administration of Cardizem -CHADS2VASC was 6 -Continue beta blocker at present. HR ranged from 52-75 -C/w Telemetry Monitoring. -Not a good Candidate for Anticoagulation per Cardiology because of Dementia and Social Living Situation.   5.Hypertension -Possibly 2/2 to Pain; Treat pain with Acetaminophen.  -Continue Hydralazine 50  mg po q8H, Metoprolol 25 mg po BID. Amlodipine changed to 5  mg BID by Cardiology.) -Cardiology increased Imdur to 90 mg.  -No ACE/ARB because of CKD  6. Hx of CAD with CABG x4 and Left Main Stent -As in #1  7. Hyperlipidemia -Continue  Atorvastatin 20 mg po qHS and Ezetimibe 10 mg po Daily  8. Hx of Chronic Diastolic Heart Failure now combined with Systolic Dysfunction -Not in Exacerbation -Last EF was 55-60% but showed Grade 3 Diastolic Dysfunction -RECENT ECHO showed Left ventricle: The cavity size was normal. There was moderate concentric hypertrophy. Systolic function was mildly reduced. The estimated ejection fraction was in the range of 45% to 50%.Regional wall motion abnormalities cannot be excluded.- Aortic valve: Transvalvular velocity was minimally increased.There was very mild stenosis. There was mild regurgitation. Valvearea (VTI): 1.39 cm^2. Valve area (Vmax): 1.22 cm^2. Valve area(Vmean): 1.17 cm^2.- Left atrium: The atrium was mildly dilated. -Follow up with Cardiology Dr. Wynonia Lawman  Discharge Instructions  Discharge Instructions    (HEART FAILURE PATIENTS) Call MD:  Anytime you have any of the following symptoms: 1) 3 pound weight gain in 24 hours or 5 pounds in 1 week 2) shortness of breath, with or without a dry hacking cough 3) swelling in the hands, feet or stomach 4) if you have to sleep on extra pillows at night in order to breathe.    Complete by:  As directed    Call MD for:  difficulty breathing, headache or visual disturbances    Complete by:  As directed    Call MD for:  persistant dizziness or light-headedness    Complete by:  As directed    Call MD for:  persistant nausea and vomiting    Complete by:  As directed    Diet - low sodium heart healthy    Complete by:  As directed    Discharge instructions    Complete by:  As directed    Follow Up Care at SNF   Increase activity slowly    Complete by:  As directed        Medication List    STOP taking these medications   losartan 50 MG tablet Commonly  known as:  COZAAR   simvastatin 40 MG tablet Commonly known as:  ZOCOR     TAKE these medications   amLODipine 5 MG tablet Commonly known as:  NORVASC Take 1 tablet (5 mg total) by mouth 2 (two) times daily after a meal.   aspirin 325 MG EC tablet Take 1 tablet (325 mg total) by mouth daily. Start taking on:  03/07/2016 What changed:  medication strength  how much to take   atorvastatin 20 MG tablet Commonly known as:  LIPITOR Take 1 tablet (20 mg total) by mouth daily at 6 PM.   clopidogrel 75 MG tablet Commonly known as:  PLAVIX Take 75 mg by mouth daily.   ezetimibe 10 MG tablet Commonly known as:  ZETIA Take 10 mg by mouth daily.   feeding supplement (ENSURE ENLIVE) Liqd Take 237 mLs by mouth 2 (two) times daily between meals.   hydrALAZINE 50 MG tablet Commonly known as:  APRESOLINE Take 1 tablet (50 mg total) by mouth every 8 (eight) hours. What changed:  how much to take  when to take this  additional instructions   isosorbide mononitrate 30 MG 24 hr tablet Commonly known as:  IMDUR Take 3 tablets (90 mg total) by mouth daily. Start taking on:  03/07/2016 What changed:  how much to take   memantine 10 MG tablet Commonly known as:  NAMENDA Take 10 mg by mouth 2 (two) times daily.   metoprolol tartrate 25 MG tablet Commonly known as:  LOPRESSOR Take 1 tablet (25 mg total) by mouth 2 (two) times daily. What changed:  medication strength  how much to take       Allergies  Allergen Reactions  . Clindamycin/Lincomycin Rash and Other (See Comments)    Heart problems     Consultations: Cardiology Case Management  Procedures/Studies: Ct Head Wo Contrast  Result Date: 03/02/2016 CLINICAL DATA:  82 year old female post recurrent falls at home. Dementia. Initial encounter. EXAM: CT HEAD WITHOUT CONTRAST TECHNIQUE: Contiguous axial images were obtained from the base of the skull through the vertex without intravenous contrast. COMPARISON:   05/23/2012 head CT. FINDINGS: Brain: No intracranial hemorrhage or CT evidence of large acute infarct Mild atrophy most notable temporal lobes. Mild chronic microvascular changes. No intracranial mass lesion noted on this unenhanced exam. Partially empty expanded sella without change and felt to be an incidental finding. Vascular: Vascular calcifications Skull: No skull fracture.  Congenital fusion C1/ occiput. Sinuses/Orbits: No acute orbital abnormality. Post right lens replacement. Mild polypoid opacification lateral aspect left maxillary sinus. Other: Negative IMPRESSION: No skull fracture or intracranial hemorrhage. Mild atrophy most notable temporal lobes. Congenital fusion occiput and C1 incidentally noted with narrowing of the foramen magnum unchanged. Electronically Signed   By: Genia Del M.D.   On: 03/02/2016 20:18   Dg Chest Portable 1 View  Result Date: 03/02/2016 CLINICAL DATA:  Acute chest pain and tachycardia. EXAM: PORTABLE CHEST 1 VIEW COMPARISON:  05/23/2012 and prior radiographs FINDINGS: Cardiomegaly, CABG changes and breast prostheses again noted. There is no evidence of focal airspace disease, pulmonary edema, suspicious pulmonary nodule/mass, pleural effusion, or pneumothorax. No acute bony abnormalities are identified. IMPRESSION: Cardiomegaly without acute cardiopulmonary disease Electronically Signed   By: Margarette Canada M.D.   On: 03/02/2016 09:54   ECHOCARDIOGRAM Study Conclusions  - Left ventricle: The cavity size was normal. There was moderate   concentric hypertrophy. Systolic function was mildly reduced. The   estimated ejection fraction was in the range of 45% to 50%.   Regional wall motion abnormalities cannot be excluded. - Aortic valve: Transvalvular velocity was minimally increased.   There was very mild stenosis. There was mild regurgitation. Valve   area (VTI): 1.39 cm^2. Valve area (Vmax): 1.22 cm^2. Valve area   (Vmean): 1.17 cm^2. - Left atrium: The  atrium was mildly dilated.  Subjective: Patient was seen and examined at bedside and she didn't sleep last night. Still complained of chronic back pain but had no other issues. No Cp, SOB, N/V. Had no other complaints or concerns.  Discharge Exam: Vitals:   03/06/16 0800 03/06/16 1400  BP: (!) 133/58 (!) 134/54  Pulse: 62 (!) 51  Resp: 20 18  Temp: 99 F (37.2 C) 98 F (36.7 C)   Vitals:   03/06/16 0432 03/06/16 0638 03/06/16 0800 03/06/16 1400  BP:  (!) 149/54 (!) 133/58 (!) 134/54  Pulse:   62 (!) 51  Resp:   20 18  Temp: 98.1 F (36.7 C)  99 F (37.2 C) 98 F (36.7 C)  TempSrc: Oral  Oral Oral  SpO2:   99% 99%  Weight: 52 kg (114 lb 9.6 oz)     Height:       General: Pt is alert, awake, not in acute distress Cardiovascular: 2/6 Systolic Murmur,  S1/S2 +, no rubs, no gallops Respiratory: CTA bilaterally, no wheezing, no rhonchi Abdominal: Soft, NT, ND, bowel sounds + Extremities: no edema, no cyanosis  The results of significant diagnostics from this hospitalization (including imaging, microbiology, ancillary and laboratory) are listed below for reference.    Microbiology: Recent Results (from the past 240 hour(s))  MRSA PCR Screening     Status: None   Collection Time: 03/02/16  8:35 PM  Result Value Ref Range Status   MRSA by PCR NEGATIVE NEGATIVE Final    Comment:        The GeneXpert MRSA Assay (FDA approved for NASAL specimens only), is one component of a comprehensive MRSA colonization surveillance program. It is not intended to diagnose MRSA infection nor to guide or monitor treatment for MRSA infections.      Labs: BNP (last 3 results) No results for input(s): BNP in the last 8760 hours. Basic Metabolic Panel:  Recent Labs Lab 03/02/16 1000 03/02/16 1005 03/03/16 0621 03/04/16 0526 03/05/16 0848 03/06/16 0929  NA 139 141 141 138 138 137  K 3.4* 3.4* 4.5 3.8 3.8 4.3  CL 108 107 107 108 107 107  CO2 23  --  26 22 20* 18*  GLUCOSE 125*  124* 113* 106* 152* 192*  BUN 13 17 13 17 19  22*  CREATININE 1.31* 1.20* 1.33* 1.20* 1.39* 1.33*  CALCIUM 9.5  --  9.3 9.2 9.3 9.4  MG  --   --   --  2.0  --   --   PHOS  --   --   --  3.4  --   --    Liver Function Tests:  Recent Labs Lab 03/03/16 0621  AST 85*  ALT 62*  ALKPHOS 57  BILITOT 0.7  PROT 5.9*  ALBUMIN 3.6   No results for input(s): LIPASE, AMYLASE in the last 168 hours. No results for input(s): AMMONIA in the last 168 hours. CBC:  Recent Labs Lab 03/02/16 1000 03/02/16 1005 03/03/16 0621 03/04/16 0526 03/05/16 0619 03/06/16 0510  WBC 7.0  --  6.6 6.7 5.9 6.2  NEUTROABS 5.4  --  4.1  --   --   --   HGB 12.3 12.9 11.5* 11.7* 12.6 12.0  HCT 37.6 38.0 35.5* 36.2 38.8 37.1  MCV 90.0  --  90.8 90.3 89.8 90.3  PLT 194  --  218 195 126* 193   Cardiac Enzymes:  Recent Labs Lab 03/02/16 1750 03/02/16 2145 03/03/16 0120 03/03/16 0621 03/03/16 1229  CKTOTAL  --  304*  --   --   --   TROPONINI 9.41*  --  6.11* 5.36* 5.24*   BNP: Invalid input(s): POCBNP CBG: No results for input(s): GLUCAP in the last 168 hours. D-Dimer No results for input(s): DDIMER in the last 72 hours. Hgb A1c No results for input(s): HGBA1C in the last 72 hours. Lipid Profile No results for input(s): CHOL, HDL, LDLCALC, TRIG, CHOLHDL, LDLDIRECT in the last 72 hours. Thyroid function studies No results for input(s): TSH, T4TOTAL, T3FREE, THYROIDAB in the last 72 hours.  Invalid input(s): FREET3 Anemia work up No results for input(s): VITAMINB12, FOLATE, FERRITIN, TIBC, IRON, RETICCTPCT in the last 72 hours. Urinalysis    Component Value Date/Time   COLORURINE YELLOW 01/31/2016 1233   APPEARANCEUR CLEAR 01/31/2016 1233   LABSPEC 1.015 01/31/2016 1233   PHURINE 6.5 01/31/2016 1233   GLUCOSEU NEGATIVE 01/31/2016 1233   HGBUR NEGATIVE 01/31/2016 [place] 01/31/2016 1233   KETONESUR  NEGATIVE 01/31/2016 1233   PROTEINUR NEGATIVE 01/31/2016 1233    UROBILINOGEN 1.0 05/23/2012 0257   NITRITE NEGATIVE 01/31/2016 1233   LEUKOCYTESUR NEGATIVE 01/31/2016 1233   Sepsis Labs Invalid input(s): PROCALCITONIN,  WBC,  LACTICIDVEN Microbiology Recent Results (from the past 240 hour(s))  MRSA PCR Screening     Status: None   Collection Time: 03/02/16  8:35 PM  Result Value Ref Range Status   MRSA by PCR NEGATIVE NEGATIVE Final    Comment:        The GeneXpert MRSA Assay (FDA approved for NASAL specimens only), is one component of a comprehensive MRSA colonization surveillance program. It is not intended to diagnose MRSA infection nor to guide or monitor treatment for MRSA infections.    Time coordinating discharge: Over 30 minutes  SIGNED:  Kerney Elbe, DO [facility] 03/06/2016, 3:29 PM Pager [phone]  If 7PM-7AM, please contact night-coverage [email or web address] Password [email or web address]

## 2016-03-06 NOTE — Consult Note (Signed)
   [facility] CM Inpatient Consult   03/06/2016  NICA BENEDICT 1926-05-24 [id number]    [facility] Care Management follow up on referral. Chart reviewed. Noted discharge plan is now for SNF. Will not pursue for [facility] Care Management at this time. Will attempt to re-engage if disposition plans change. Of note, voicemail was left for son on Friday without return call. [facility] Care Management packet was left at bedside on Friday as well.   Marthenia Rolling, MSN-Ed, RN,BSN [facility] Liaison [phone]

## 2016-03-06 NOTE — Clinical Social Work Placement (Signed)
   CLINICAL SOCIAL WORK PLACEMENT  NOTE  Date:  03/06/2016  Patient Details  Name: [name] MRN: [id number] Date of Birth: 03-16-1927  Clinical Social Work is seeking post-discharge placement for this patient at the [place] level of care (*CSW will initial, date and re-position this form in  chart as items are completed):  Yes   Patient/family provided with [place] Work Department's list of facilities offering this level of care within the geographic area requested by the patient (or if unable, by the patient's family).  Yes   Patient/family informed of their freedom to choose among providers that offer the needed level of care, that participate in Medicare, Medicaid or managed care program needed by the patient, have an available bed and are willing to accept the patient.  Yes   Patient/family informed of [facility]'s ownership interest in [facility] and [facility], as well as of the fact that they are under no obligation to receive care at these facilities.  PASRR submitted to EDS on       PASRR number received on       Existing PASRR number confirmed on 03/06/16     FL2 transmitted to all facilities in geographic area requested by pt/family on 03/06/16     FL2 transmitted to all facilities within larger geographic area on       Patient informed that his/her managed care company has contracts with or will negotiate with certain facilities, including the following:            Patient/family informed of bed offers received.  Patient chooses bed at [facility]     Physician recommends and patient chooses bed at      Patient to be transferred to [facility] on 03/06/16.  Patient to be transferred to facility by Son      Patient family notified on 03/06/16 of transfer.  Name of family member notified:  Lovern,Rick     PHYSICIAN Please prepare priority discharge summary, including medications, Please  prepare prescriptions, Please sign FL2, Please sign DNR     Additional Comment:  Per MD patient is ready to discharge to [facility] RN, patient, patient's family, and facility notified of discharge. RN given phone number for report and transport packet is on patient's chart. Patient's son will provide transportation to facility CSW signing off.   _______________________________________________ Samule Dry, LCSW 03/06/2016, 5:12 PM

## 2016-03-08 DIAGNOSIS — D696 Thrombocytopenia, unspecified: Secondary | ICD-10-CM | POA: Diagnosis not present

## 2016-03-08 DIAGNOSIS — Z8572 Personal history of non-Hodgkin lymphomas: Secondary | ICD-10-CM | POA: Diagnosis not present

## 2016-03-08 DIAGNOSIS — J9610 Chronic respiratory failure, unspecified whether with hypoxia or hypercapnia: Secondary | ICD-10-CM | POA: Diagnosis not present

## 2016-03-08 DIAGNOSIS — D469 Myelodysplastic syndrome, unspecified: Secondary | ICD-10-CM | POA: Diagnosis not present

## 2016-03-17 DIAGNOSIS — I480 Paroxysmal atrial fibrillation: Secondary | ICD-10-CM | POA: Diagnosis not present

## 2016-03-17 DIAGNOSIS — I214 Non-ST elevation (NSTEMI) myocardial infarction: Secondary | ICD-10-CM | POA: Diagnosis not present

## 2016-03-17 DIAGNOSIS — M6281 Muscle weakness (generalized): Secondary | ICD-10-CM | POA: Diagnosis not present

## 2016-03-17 DIAGNOSIS — Z951 Presence of aortocoronary bypass graft: Secondary | ICD-10-CM | POA: Diagnosis not present

## 2016-03-17 DIAGNOSIS — I110 Hypertensive heart disease with heart failure: Secondary | ICD-10-CM | POA: Diagnosis not present

## 2016-03-17 DIAGNOSIS — F0390 Unspecified dementia without behavioral disturbance: Secondary | ICD-10-CM | POA: Diagnosis not present

## 2016-03-17 DIAGNOSIS — I5043 Acute on chronic combined systolic (congestive) and diastolic (congestive) heart failure: Secondary | ICD-10-CM | POA: Diagnosis not present

## 2016-03-17 DIAGNOSIS — R2689 Other abnormalities of gait and mobility: Secondary | ICD-10-CM | POA: Diagnosis not present

## 2016-03-17 DIAGNOSIS — R41841 Cognitive communication deficit: Secondary | ICD-10-CM | POA: Diagnosis not present

## 2016-03-17 DIAGNOSIS — E1151 Type 2 diabetes mellitus with diabetic peripheral angiopathy without gangrene: Secondary | ICD-10-CM | POA: Diagnosis not present

## 2016-03-20 DIAGNOSIS — I110 Hypertensive heart disease with heart failure: Secondary | ICD-10-CM | POA: Diagnosis not present

## 2016-03-20 DIAGNOSIS — E1151 Type 2 diabetes mellitus with diabetic peripheral angiopathy without gangrene: Secondary | ICD-10-CM | POA: Diagnosis not present

## 2016-03-20 DIAGNOSIS — I214 Non-ST elevation (NSTEMI) myocardial infarction: Secondary | ICD-10-CM | POA: Diagnosis not present

## 2016-03-20 DIAGNOSIS — I480 Paroxysmal atrial fibrillation: Secondary | ICD-10-CM | POA: Diagnosis not present

## 2016-03-20 DIAGNOSIS — I5043 Acute on chronic combined systolic (congestive) and diastolic (congestive) heart failure: Secondary | ICD-10-CM | POA: Diagnosis not present

## 2016-03-20 DIAGNOSIS — F0390 Unspecified dementia without behavioral disturbance: Secondary | ICD-10-CM | POA: Diagnosis not present

## 2016-03-21 DIAGNOSIS — I214 Non-ST elevation (NSTEMI) myocardial infarction: Secondary | ICD-10-CM | POA: Diagnosis not present

## 2016-03-21 DIAGNOSIS — F0390 Unspecified dementia without behavioral disturbance: Secondary | ICD-10-CM | POA: Diagnosis not present

## 2016-03-21 DIAGNOSIS — I5043 Acute on chronic combined systolic (congestive) and diastolic (congestive) heart failure: Secondary | ICD-10-CM | POA: Diagnosis not present

## 2016-03-21 DIAGNOSIS — I480 Paroxysmal atrial fibrillation: Secondary | ICD-10-CM | POA: Diagnosis not present

## 2016-03-21 DIAGNOSIS — E1151 Type 2 diabetes mellitus with diabetic peripheral angiopathy without gangrene: Secondary | ICD-10-CM | POA: Diagnosis not present

## 2016-03-21 DIAGNOSIS — I110 Hypertensive heart disease with heart failure: Secondary | ICD-10-CM | POA: Diagnosis not present

## 2016-03-23 DIAGNOSIS — I214 Non-ST elevation (NSTEMI) myocardial infarction: Secondary | ICD-10-CM | POA: Diagnosis not present

## 2016-03-23 DIAGNOSIS — F0390 Unspecified dementia without behavioral disturbance: Secondary | ICD-10-CM | POA: Diagnosis not present

## 2016-03-23 DIAGNOSIS — I5043 Acute on chronic combined systolic (congestive) and diastolic (congestive) heart failure: Secondary | ICD-10-CM | POA: Diagnosis not present

## 2016-03-23 DIAGNOSIS — E1151 Type 2 diabetes mellitus with diabetic peripheral angiopathy without gangrene: Secondary | ICD-10-CM | POA: Diagnosis not present

## 2016-03-23 DIAGNOSIS — I110 Hypertensive heart disease with heart failure: Secondary | ICD-10-CM | POA: Diagnosis not present

## 2016-03-23 DIAGNOSIS — I480 Paroxysmal atrial fibrillation: Secondary | ICD-10-CM | POA: Diagnosis not present

## 2016-03-24 DIAGNOSIS — F0390 Unspecified dementia without behavioral disturbance: Secondary | ICD-10-CM | POA: Diagnosis not present

## 2016-03-24 DIAGNOSIS — I214 Non-ST elevation (NSTEMI) myocardial infarction: Secondary | ICD-10-CM | POA: Diagnosis not present

## 2016-03-24 DIAGNOSIS — I110 Hypertensive heart disease with heart failure: Secondary | ICD-10-CM | POA: Diagnosis not present

## 2016-03-24 DIAGNOSIS — E1151 Type 2 diabetes mellitus with diabetic peripheral angiopathy without gangrene: Secondary | ICD-10-CM | POA: Diagnosis not present

## 2016-03-24 DIAGNOSIS — I480 Paroxysmal atrial fibrillation: Secondary | ICD-10-CM | POA: Diagnosis not present

## 2016-03-24 DIAGNOSIS — I5043 Acute on chronic combined systolic (congestive) and diastolic (congestive) heart failure: Secondary | ICD-10-CM | POA: Diagnosis not present

## 2016-03-26 ENCOUNTER — Emergency Department (HOSPITAL_COMMUNITY): Payer: Medicare Other

## 2016-03-26 ENCOUNTER — Observation Stay (HOSPITAL_COMMUNITY)
Admission: EM | Admit: 2016-03-26 | Discharge: 2016-03-28 | Disposition: A | Discharge status: Home / Self Care | Payer: Medicare Other | Attending: Internal Medicine | Admitting: Internal Medicine

## 2016-03-26 ENCOUNTER — Encounter (HOSPITAL_COMMUNITY): Payer: Self-pay

## 2016-03-26 DIAGNOSIS — N289 Disorder of kidney and ureter, unspecified: Secondary | ICD-10-CM | POA: Diagnosis not present

## 2016-03-26 DIAGNOSIS — Z951 Presence of aortocoronary bypass graft: Secondary | ICD-10-CM | POA: Diagnosis not present

## 2016-03-26 DIAGNOSIS — R079 Chest pain, unspecified: Secondary | ICD-10-CM | POA: Diagnosis not present

## 2016-03-26 DIAGNOSIS — Z79899 Other long term (current) drug therapy: Secondary | ICD-10-CM | POA: Insufficient documentation

## 2016-03-26 DIAGNOSIS — Z7982 Long term (current) use of aspirin: Secondary | ICD-10-CM | POA: Diagnosis not present

## 2016-03-26 DIAGNOSIS — Z87891 Personal history of nicotine dependence: Secondary | ICD-10-CM | POA: Diagnosis not present

## 2016-03-26 DIAGNOSIS — R0789 Other chest pain: Secondary | ICD-10-CM | POA: Diagnosis not present

## 2016-03-26 DIAGNOSIS — I255 Ischemic cardiomyopathy: Secondary | ICD-10-CM | POA: Diagnosis present

## 2016-03-26 DIAGNOSIS — I350 Nonrheumatic aortic (valve) stenosis: Secondary | ICD-10-CM

## 2016-03-26 DIAGNOSIS — I119 Hypertensive heart disease without heart failure: Secondary | ICD-10-CM | POA: Diagnosis present

## 2016-03-26 DIAGNOSIS — N183 Chronic kidney disease, stage 3 unspecified: Secondary | ICD-10-CM | POA: Diagnosis present

## 2016-03-26 DIAGNOSIS — I252 Old myocardial infarction: Secondary | ICD-10-CM | POA: Diagnosis not present

## 2016-03-26 DIAGNOSIS — I110 Hypertensive heart disease with heart failure: Secondary | ICD-10-CM | POA: Diagnosis not present

## 2016-03-26 DIAGNOSIS — I214 Non-ST elevation (NSTEMI) myocardial infarction: Secondary | ICD-10-CM | POA: Diagnosis present

## 2016-03-26 DIAGNOSIS — F0390 Unspecified dementia without behavioral disturbance: Secondary | ICD-10-CM | POA: Diagnosis present

## 2016-03-26 DIAGNOSIS — I739 Peripheral vascular disease, unspecified: Secondary | ICD-10-CM | POA: Diagnosis present

## 2016-03-26 DIAGNOSIS — I5043 Acute on chronic combined systolic (congestive) and diastolic (congestive) heart failure: Secondary | ICD-10-CM | POA: Insufficient documentation

## 2016-03-26 DIAGNOSIS — I480 Paroxysmal atrial fibrillation: Secondary | ICD-10-CM | POA: Diagnosis present

## 2016-03-26 DIAGNOSIS — I25119 Atherosclerotic heart disease of native coronary artery with unspecified angina pectoris: Secondary | ICD-10-CM | POA: Diagnosis not present

## 2016-03-26 LAB — DIFFERENTIAL
Basophils Absolute: 0 10*3/uL (ref 0.0–0.1)
Basophils Relative: 0 %
EOS PCT: 4 %
Eosinophils Absolute: 0.3 10*3/uL (ref 0.0–0.7)
LYMPHS ABS: 1.8 10*3/uL (ref 0.7–4.0)
LYMPHS PCT: 25 %
MONO ABS: 0.6 10*3/uL (ref 0.1–1.0)
Monocytes Relative: 8 %
NEUTROS PCT: 63 %
Neutro Abs: 4.5 10*3/uL (ref 1.7–7.7)

## 2016-03-26 LAB — CBC
HEMATOCRIT: 36.4 % (ref 36.0–46.0)
Hemoglobin: 12.1 g/dL (ref 12.0–15.0)
MCH: 30 pg (ref 26.0–34.0)
MCHC: 33.2 g/dL (ref 30.0–36.0)
MCV: 90.1 fL (ref 78.0–100.0)
Platelets: 257 10*3/uL (ref 150–400)
RBC: 4.04 MIL/uL (ref 3.87–5.11)
RDW: 13.2 % (ref 11.5–15.5)
WBC: 7.2 10*3/uL (ref 4.0–10.5)

## 2016-03-26 LAB — BASIC METABOLIC PANEL
Anion gap: 8 (ref 5–15)
BUN: 19 mg/dL (ref 6–20)
CHLORIDE: 105 mmol/L (ref 101–111)
CO2: 25 mmol/L (ref 22–32)
Calcium: 9.4 mg/dL (ref 8.9–10.3)
Creatinine, Ser: 1.28 mg/dL — ABNORMAL HIGH (ref 0.44–1.00)
GFR calc Af Amer: 43 mL/min — ABNORMAL LOW (ref >60)
GFR calc non Af Amer: 37 mL/min — ABNORMAL LOW (ref >60)
GLUCOSE: 111 mg/dL — AB (ref 65–99)
POTASSIUM: 3.8 mmol/L (ref 3.5–5.1)
Sodium: 138 mmol/L (ref 135–145)

## 2016-03-26 LAB — I-STAT TROPONIN, ED: Troponin i, poc: 0.06 ng/mL (ref 0.00–0.08)

## 2016-03-26 MED ORDER — MEMANTINE HCL 5 MG PO TABS
10.0000 mg | ORAL_TABLET | Freq: Two times a day (BID) | ORAL | Status: DC
  Administered 2016-03-27 – 2016-03-28 (×4): 10 mg via ORAL
  Filled 2016-03-26 (×4): qty 2

## 2016-03-26 MED ORDER — EZETIMIBE 10 MG PO TABS
10.0000 mg | ORAL_TABLET | Freq: Every day | ORAL | Status: DC
  Administered 2016-03-27 – 2016-03-28 (×2): 10 mg via ORAL
  Filled 2016-03-26 (×2): qty 1

## 2016-03-26 MED ORDER — ACETAMINOPHEN 325 MG PO TABS
650.0000 mg | ORAL_TABLET | ORAL | Status: DC | PRN
  Administered 2016-03-27: 650 mg via ORAL
  Filled 2016-03-26: qty 2

## 2016-03-26 MED ORDER — HYDRALAZINE HCL 50 MG PO TABS
50.0000 mg | ORAL_TABLET | Freq: Three times a day (TID) | ORAL | Status: DC
  Administered 2016-03-27 – 2016-03-28 (×6): 50 mg via ORAL
  Filled 2016-03-26 (×6): qty 1

## 2016-03-26 MED ORDER — MORPHINE SULFATE (PF) 4 MG/ML IV SOLN
2.0000 mg | INTRAVENOUS | Status: DC | PRN
  Administered 2016-03-27: 2 mg via INTRAVENOUS
  Filled 2016-03-26: qty 1

## 2016-03-26 MED ORDER — MORPHINE SULFATE (PF) 4 MG/ML IV SOLN
4.0000 mg | Freq: Once | INTRAVENOUS | Status: AC
  Administered 2016-03-26: 4 mg via INTRAVENOUS
  Filled 2016-03-26: qty 1

## 2016-03-26 MED ORDER — ONDANSETRON HCL 4 MG/2ML IJ SOLN
4.0000 mg | Freq: Once | INTRAMUSCULAR | Status: AC
  Administered 2016-03-26: 4 mg via INTRAVENOUS
  Filled 2016-03-26: qty 2

## 2016-03-26 MED ORDER — ISOSORBIDE MONONITRATE ER 60 MG PO TB24: 90.0000 mg | ORAL_TABLET | Freq: Every day | ORAL | Status: DC

## 2016-03-26 MED ORDER — ONDANSETRON HCL 4 MG/2ML IJ SOLN: 4.0000 mg | Freq: Four times a day (QID) | INTRAMUSCULAR | Status: DC | PRN

## 2016-03-26 MED ORDER — METOPROLOL TARTRATE 25 MG PO TABS
25.0000 mg | ORAL_TABLET | Freq: Two times a day (BID) | ORAL | Status: DC
  Administered 2016-03-27 – 2016-03-28 (×3): 25 mg via ORAL
  Filled 2016-03-26 (×3): qty 1

## 2016-03-26 MED ORDER — ASPIRIN 81 MG PO CHEW: 324.0000 mg | CHEWABLE_TABLET | Freq: Once | ORAL | Status: DC

## 2016-03-26 MED ORDER — AMLODIPINE BESYLATE 5 MG PO TABS
5.0000 mg | ORAL_TABLET | Freq: Two times a day (BID) | ORAL | Status: DC
  Administered 2016-03-27 – 2016-03-28 (×3): 5 mg via ORAL
  Filled 2016-03-26 (×3): qty 1

## 2016-03-26 MED ORDER — ENSURE ENLIVE PO LIQD
237.0000 mL | Freq: Two times a day (BID) | ORAL | Status: DC
  Administered 2016-03-27 – 2016-03-28 (×2): 237 mL via ORAL

## 2016-03-26 MED ORDER — CLOPIDOGREL BISULFATE 75 MG PO TABS
75.0000 mg | ORAL_TABLET | Freq: Every day | ORAL | Status: DC
  Administered 2016-03-27 – 2016-03-28 (×2): 75 mg via ORAL
  Filled 2016-03-26 (×2): qty 1

## 2016-03-26 MED ORDER — ENOXAPARIN SODIUM 40 MG/0.4ML ~~LOC~~ SOLN
40.0000 mg | Freq: Every day | SUBCUTANEOUS | Status: DC
  Administered 2016-03-27: 40 mg via SUBCUTANEOUS
  Filled 2016-03-26: qty 0.4

## 2016-03-26 MED ORDER — NITROGLYCERIN 0.4 MG SL SUBL
0.4000 mg | SUBLINGUAL_TABLET | SUBLINGUAL | Status: AC | PRN
  Administered 2016-03-26 (×3): 0.4 mg via SUBLINGUAL
  Filled 2016-03-26: qty 1

## 2016-03-26 MED ORDER — NITROGLYCERIN 2 % TD OINT: 1.0000 [in_us] | TOPICAL_OINTMENT | Freq: Four times a day (QID) | TRANSDERMAL | Status: DC

## 2016-03-26 MED ORDER — ATORVASTATIN CALCIUM 20 MG PO TABS
20.0000 mg | ORAL_TABLET | Freq: Every day | ORAL | Status: DC
  Administered 2016-03-27: 20 mg via ORAL
  Filled 2016-03-26: qty 1

## 2016-03-26 MED ORDER — ASPIRIN EC 325 MG PO TBEC
325.0000 mg | DELAYED_RELEASE_TABLET | Freq: Every day | ORAL | Status: DC
Start: 2016-03-27 — End: 2016-03-27
  Administered 2016-03-27: 325 mg via ORAL
  Filled 2016-03-26: qty 1

## 2016-03-26 NOTE — ED Triage Notes (Addendum)
Per EMS: Pt complaining of L sided posterior chest pain. Pt states "Its in my back. Its always in my back when its my heart." Pt with some baseline dementia. Pt complaining of some nausea, no vomiting. Pt denies any coughing or SOB. EMS gave 324 ASA enroute.

## 2016-03-26 NOTE — ED Provider Notes (Signed)
[facility] DEPT Provider Note   CSN: [id number] Arrival date & time: 03/26/16  2037     History   Chief Complaint Chief Complaint  Patient presents with  . Chest Pain    HPI [name] is a 82 y.o. female.  She complains of vague discomfort in the left side of her chest with some discomfort going into her back. She is an extremely poor historian with a known history of dementia, and she cannot describe her pain or number on it. She does state that she has had multiple heart surgeries and pain like this is almost always from her heart. There has been mild nausea. She denies dyspnea or diaphoresis. She came by ambulance where she received aspirin but has taken no other medication for her pain.   The history is provided by the patient. The history is limited by the condition of the patient (Dementia).    Past Medical History:  Diagnosis Date  . Aortic valve disorder   . Chronic diastolic heart failure ([place])   . Constipation   . Coronary artery disease   . Dementia   . Diabetes mellitus without complication ([place])   . GERD (gastroesophageal reflux disease)   . Hyperlipidemia   . Hypertensive heart disease without CHF   . Peripheral neuropathy ([place])   . Peripheral vascular disease [facility])     Patient Active Problem List   Diagnosis Date Noted  . Acute on chronic combined systolic and diastolic CHF (congestive heart failure) ([place])   . Atrial fibrillation with rapid ventricular response ([place])   . NSTEMI (non-ST elevated myocardial infarction) ([place])   . Fall   . Paroxysmal atrial fibrillation ([place]) 03/02/2016  . Chest pain 03/02/2016  . Type 2 diabetes mellitus with vascular disease ([place])   . Dementia   . MI (myocardial infarction)   . CAD (coronary artery disease)   . Hypertensive heart disease without CHF   . Hyperlipidemia   . Peripheral vascular disease ([place])   . Chronic diastolic heart failure ([place])   . Aortic valve disorder   . GERD (gastroesophageal  reflux disease)     Past Surgical History:  Procedure Laterality Date  . BREAST ENHANCEMENT SURGERY    . CAROTID ENDARTERECTOMY  12/2001  . [place], [id number]   By Dr. Camillo Flaming  . LAPAROSCOPIC CHOLECYSTECTOMY  2001  . Repair of left iliac psuedoaneurysm following cath  2006  . Repair of lymphocoele    . TONSILLECTOMY      OB History    No data available       Home Medications    Prior to Admission medications   Medication Sig Start Date End Date Taking? Authorizing Provider  amLODipine (NORVASC) 5 MG tablet Take 1 tablet (5 mg total) by mouth 2 (two) times daily after a meal. 03/06/16   [place], DO  aspirin EC 325 MG EC tablet Take 1 tablet (325 mg total) by mouth daily. 03/07/16   [place], DO  atorvastatin (LIPITOR) 20 MG tablet Take 1 tablet (20 mg total) by mouth daily at 6 PM. 03/06/16   Kerney Elbe, DO  clopidogrel (PLAVIX) 75 MG tablet Take 75 mg by mouth daily.    Historical Provider, MD  ezetimibe (ZETIA) 10 MG tablet Take 10 mg by mouth daily.    Historical Provider, MD  feeding supplement, ENSURE ENLIVE, (ENSURE ENLIVE) LIQD Take 237 mLs by mouth 2 (two) times daily between meals. 03/06/16  [place], DO  hydrALAZINE (APRESOLINE) 50 MG tablet Take 1 tablet (50 mg total) by mouth every 8 (eight) hours. 03/06/16   [place], DO  isosorbide mononitrate (IMDUR) 30 MG 24 hr tablet Take 3 tablets (90 mg total) by mouth daily. 03/07/16   [place], DO  memantine (NAMENDA) 10 MG tablet Take 10 mg by mouth 2 (two) times daily.    Historical Provider, MD  metoprolol tartrate (LOPRESSOR) 25 MG tablet Take 1 tablet (25 mg total) by mouth 2 (two) times daily. 03/06/16   Kerney Elbe, DO    Family History History reviewed. No pertinent family history.  Social History Social History  Substance Use Topics  . Smoking status: Former Research scientist (life sciences)  . Smokeless tobacco: Never Used  . Alcohol  use No     Allergies   Clindamycin/lincomycin   Review of Systems Review of Systems  All other systems reviewed and are negative.    Physical Exam Updated Vital Signs BP 145/62 (BP Location: Left Arm)   Pulse (!) 57   Temp 99 F (37.2 C) (Oral)   Resp 16   SpO2 99%   Physical Exam  Nursing note and vitals reviewed.  82 year old female, resting comfortably and in no acute distress. Vital signs are significant for mild hypertension and borderline bradycardia. Oxygen saturation is 99%, which is normal. Head is normocephalic and atraumatic. PERRLA, EOMI. Oropharynx is clear. Neck is nontender and supple without adenopathy or JVD. Back is nontender and there is no CVA tenderness. Lungs are clear without rales, wheezes, or rhonchi. Chest is nontender. Heart has regular rate and rhythm without murmur. Abdomen is soft, flat, nontender without masses or hepatosplenomegaly and peristalsis is normoactive. Extremities have no cyanosis or edema, full range of motion is present. Skin is warm and dry without rash. Neurologic: She is awake and alert and oriented to person and place but not time, cranial nerves are intact, there are no motor or sensory deficits.  ED Treatments / Results  Labs (all labs ordered are listed, but only abnormal results are displayed) Labs Reviewed  BASIC METABOLIC PANEL - Abnormal; Notable for the following:       Result Value   Glucose, Bld 111 (*)    Creatinine, Ser 1.28 (*)    GFR calc non Af Amer 37 (*)    GFR calc Af Amer 43 (*)    All other components within normal limits  CBC  DIFFERENTIAL  TROPONIN I  TROPONIN I  TROPONIN I  I-STAT TROPOININ, ED    EKG  EKG Interpretation  Date/Time:  Sunday March 26 2016 21:06:59 EST Ventricular Rate:  67 PR Interval:    QRS Duration: 90 QT Interval:  440 QTC Calculation: 465 R Axis:   34 Text Interpretation:  Sinus rhythm Probable LVH with secondary repol abnrm When compared with ECG of  03/05/2016, No significant change was found Confirmed by [facility]  MD, Cledis Sohn ([id number]) on 03/26/2016 9:11:40 PM       Radiology Dg Chest 2 View  Result Date: 03/26/2016 CLINICAL DATA:  Chest pain. EXAM: CHEST  2 VIEW COMPARISON:  Chest radiograph 03/02/2016 FINDINGS: Mediastinal surgical clips and median sternotomy wires are again noted. There are bilateral breast implants. Cardiomediastinal contours are normal. No focal airspace consolidation or pulmonary edema. No pleural effusion or pneumothorax. IMPRESSION: No active cardiopulmonary disease. Electronically Signed   By: Ulyses Jarred M.D.   On: 03/26/2016 21:58    Procedures Procedures (including critical  care time)  Medications Ordered in ED Medications  clopidogrel (PLAVIX) tablet 75 mg (not administered)  amLODipine (NORVASC) tablet 5 mg (not administered)  aspirin EC tablet 325 mg (not administered)  atorvastatin (LIPITOR) tablet 20 mg (not administered)  ezetimibe (ZETIA) tablet 10 mg (not administered)  feeding supplement (ENSURE ENLIVE) (ENSURE ENLIVE) liquid 237 mL (not administered)  hydrALAZINE (APRESOLINE) tablet 50 mg (not administered)  isosorbide mononitrate (IMDUR) 24 hr tablet 90 mg (not administered)  memantine (NAMENDA) tablet 10 mg (not administered)  metoprolol tartrate (LOPRESSOR) tablet 25 mg (not administered)  acetaminophen (TYLENOL) tablet 650 mg (not administered)  ondansetron (ZOFRAN) injection 4 mg (not administered)  enoxaparin (LOVENOX) injection 40 mg (not administered)  morphine 4 MG/ML injection 2 mg (not administered)  morphine 4 MG/ML injection 4 mg (not administered)  nitroGLYCERIN (NITROSTAT) SL tablet 0.4 mg (0.4 mg Sublingual Given 03/26/16 2212)  ondansetron (ZOFRAN) injection 4 mg (4 mg Intravenous Given 03/26/16 2148)  morphine 4 MG/ML injection 4 mg (4 mg Intravenous Given 03/26/16 2236)     Initial Impression / Assessment and Plan / ED Course  I have reviewed the triage vital signs and the  nursing notes.  Pertinent labs & imaging results that were available during my care of the patient were reviewed by me and considered in my medical decision making (see chart for details).  Clinical Course    Chest pain of uncertain cause. Old records are reviewed, and she was admitted with a similar presentation one month ago and was diagnosed with a non-STEMI. She has history of coronary artery bypass and multiple stent placements. ECG does not show acute changes, but this also was the case at her last hospitalization. Given her history, pain should be considered cardiac until proven otherwise. She is given nitroglycerin in the ED.  Please note that she has a history of paroxysmal atrial fibrillation but is not currently in atrial fibrillation - CHADS-VASC score = 6.  She had no relief with 2 nitroglycerin. She was given morphine and still is continuing to have pain and is given a second dose of morphine. Laboratory workup shows normal troponin. She has mild renal insufficiency which is unchanged from baseline. Because of cardiac history, it is felt that she needs to be admitted for serial troponins. Case is discussed with Dr. Alcario Drought of [facility] who agrees to admit the patient under observation status.  Final Clinical Impressions(s) / ED Diagnoses   Final diagnoses:  Chest pain, unspecified type  Coronary artery disease involving native heart with angina pectoris, unspecified vessel or lesion type [facility])  Renal insufficiency    New Prescriptions New Prescriptions   No medications on file     Delora Fuel, MD [id number]

## 2016-03-26 NOTE — H&P (Signed)
History and Physical    [name] [id number] DOB: 10/04/1926 DOA: 03/26/2016   PCP: Mayra Neer, MD Chief Complaint:  Chief Complaint  Patient presents with  . Chest Pain    HPI: [name] is a 82 y.o. female with medical history significant of CAD, CABG, aortic valve disease, A.Fib.  Patient presents to the ED with L sided chest and back pain.  States that pain like this has always been associated with her heart in the past.  Recent admit earlier this month for chest pain and elevated troponin but that was due to A.Fib RVR, today her HR is 60.  Some nausea, no vomiting.  Has baseline dementia which makes details such as onset, duration, etc difficult to get.  Pain is persistant still, severe.  ED Course: 324 ASA, 2x NTG and 4 morphine and she is still having pain.  NTG provided no benefit.  Review of Systems: As per HPI otherwise 10 point review of systems negative.    Past Medical History:  Diagnosis Date  . Aortic valve disorder   . Chronic diastolic heart failure ([place])   . Constipation   . Coronary artery disease   . Dementia   . Diabetes mellitus without complication ([place])   . GERD (gastroesophageal reflux disease)   . Hyperlipidemia   . Hypertensive heart disease without CHF   . Peripheral neuropathy ([place])   . Peripheral vascular disease [facility])     Past Surgical History:  Procedure Laterality Date  . BREAST ENHANCEMENT SURGERY    . CAROTID ENDARTERECTOMY  12/2001  . [place], [id number]   By Dr. Camillo Flaming  . LAPAROSCOPIC CHOLECYSTECTOMY  2001  . Repair of left iliac psuedoaneurysm following cath  2006  . Repair of lymphocoele    . TONSILLECTOMY       reports that she has quit smoking. She has never used smokeless tobacco. She reports that she does not drink alcohol or use drugs.  Allergies  Allergen Reactions  . Clindamycin/Lincomycin Rash and Other (See Comments)    Heart problems     History reviewed.  No pertinent family history.    Prior to Admission medications   Medication Sig Start Date End Date Taking? Authorizing Provider  amLODipine (NORVASC) 5 MG tablet Take 1 tablet (5 mg total) by mouth 2 (two) times daily after a meal. 03/06/16  Yes [place], DO  aspirin EC 325 MG EC tablet Take 1 tablet (325 mg total) by mouth daily. 03/07/16  Yes [place], DO  atorvastatin (LIPITOR) 20 MG tablet Take 1 tablet (20 mg total) by mouth daily at 6 PM. 03/06/16  Yes [place], DO  clopidogrel (PLAVIX) 75 MG tablet Take 75 mg by mouth daily.   Yes Historical Provider, MD  ezetimibe (ZETIA) 10 MG tablet Take 10 mg by mouth daily.   Yes Historical Provider, MD  feeding supplement, ENSURE ENLIVE, (ENSURE ENLIVE) LIQD Take 237 mLs by mouth 2 (two) times daily between meals. 03/06/16  Yes [place], DO  hydrALAZINE (APRESOLINE) 50 MG tablet Take 1 tablet (50 mg total) by mouth every 8 (eight) hours. 03/06/16  Yes [place], DO  isosorbide mononitrate (IMDUR) 30 MG 24 hr tablet Take 3 tablets (90 mg total) by mouth daily. 03/07/16  Yes [place], DO  memantine (NAMENDA) 10 MG tablet Take 10 mg by mouth 2 (two) times daily.   Yes Historical Provider, MD  metoprolol tartrate (LOPRESSOR) 25  MG tablet Take 1 tablet (25 mg total) by mouth 2 (two) times daily. 03/06/16  Yes Kerney Elbe, DO    Physical Exam: Vitals:   03/26/16 2200 03/26/16 2215 03/26/16 2230 03/26/16 2245  BP: 143/58 (!) 142/52 (!) 136/51 (!) 153/54  Pulse: 64 (!) 59 (!) 56 (!) 56  Resp: 13 13 12 15   Temp:      TempSrc:      SpO2: 99% 97% 96% 99%      Constitutional: NAD, calm, comfortable Eyes: PERRL, lids and conjunctivae normal ENMT: Mucous membranes are moist. Posterior pharynx clear of any exudate or lesions.Normal dentition.  Neck: normal, supple, no masses, no thyromegaly Respiratory: clear to auscultation bilaterally, no wheezing, no crackles. Normal respiratory  effort. No accessory muscle use.  Cardiovascular: Regular rate and rhythm, no murmurs / rubs / gallops. No extremity edema. 2+ pedal pulses. No carotid bruits.  Abdomen: no tenderness, no masses palpated. No hepatosplenomegaly. Bowel sounds positive.  Musculoskeletal: no clubbing / cyanosis. No joint deformity upper and lower extremities. Good ROM, no contractures. Normal muscle tone.  Skin: no rashes, lesions, ulcers. No induration Neurologic: CN 2-12 grossly intact. Sensation intact, DTR normal. Strength 5/5 in all 4.  Psychiatric: Normal judgment and insight. Alert and oriented x 3. Normal mood.    Labs on Admission: I have personally reviewed following labs and imaging studies  CBC:  Recent Labs Lab 03/26/16 2056 03/26/16 2109  WBC 7.2  --   NEUTROABS  --  4.5  HGB 12.1  --   HCT 36.4  --   MCV 90.1  --   PLT 257  --    Basic Metabolic Panel:  Recent Labs Lab 03/26/16 2056  NA 138  K 3.8  CL 105  CO2 25  GLUCOSE 111*  BUN 19  CREATININE 1.28*  CALCIUM 9.4   GFR: CrCl cannot be calculated (Unknown ideal weight.). Liver Function Tests: No results for input(s): AST, ALT, ALKPHOS, BILITOT, PROT, ALBUMIN in the last 168 hours. No results for input(s): LIPASE, AMYLASE in the last 168 hours. No results for input(s): AMMONIA in the last 168 hours. Coagulation Profile: No results for input(s): INR, PROTIME in the last 168 hours. Cardiac Enzymes: No results for input(s): CKTOTAL, CKMB, CKMBINDEX, TROPONINI in the last 168 hours. BNP (last 3 results) No results for input(s): PROBNP in the last 8760 hours. HbA1C: No results for input(s): HGBA1C in the last 72 hours. CBG: No results for input(s): GLUCAP in the last 168 hours. Lipid Profile: No results for input(s): CHOL, HDL, LDLCALC, TRIG, CHOLHDL, LDLDIRECT in the last 72 hours. Thyroid Function Tests: No results for input(s): TSH, T4TOTAL, FREET4, T3FREE, THYROIDAB in the last 72 hours. Anemia Panel: No results  for input(s): VITAMINB12, FOLATE, FERRITIN, TIBC, IRON, RETICCTPCT in the last 72 hours. Urine analysis:    Component Value Date/Time   COLORURINE YELLOW 01/31/2016 [place] 01/31/2016 1233   LABSPEC 1.015 01/31/2016 1233   PHURINE 6.5 01/31/2016 1233   GLUCOSEU NEGATIVE 01/31/2016 1233   HGBUR NEGATIVE 01/31/2016 1233   BILIRUBINUR NEGATIVE 01/31/2016 1233   KETONESUR NEGATIVE 01/31/2016 1233   PROTEINUR NEGATIVE 01/31/2016 1233   UROBILINOGEN 1.0 05/23/2012 0257   NITRITE NEGATIVE 01/31/2016 1233   LEUKOCYTESUR NEGATIVE 01/31/2016 1233   Sepsis Labs: @LABRCNTIP (procalcitonin:4,lacticidven:4) )No results found for this or any previous visit (from the past 240 hour(s)).   Radiological Exams on Admission: Dg Chest 2 View  Result Date: 03/26/2016 CLINICAL DATA:  Chest pain.  EXAM: CHEST  2 VIEW COMPARISON:  Chest radiograph 03/02/2016 FINDINGS: Mediastinal surgical clips and median sternotomy wires are again noted. There are bilateral breast implants. Cardiomediastinal contours are normal. No focal airspace consolidation or pulmonary edema. No pleural effusion or pneumothorax. IMPRESSION: No active cardiopulmonary disease. Electronically Signed   By: Ulyses Jarred M.D.   On: 03/26/2016 21:58    EKG: Independently reviewed.  Assessment/Plan Active Problems:   Chest pain, moderate coronary artery risk    1. Chest pain mod risk - HEART score of 6 1. CP obs pathway 2. Serial trops 3. Call cards in AM 4. Of note she had elevated trop last time, but cards said not a candidate for intervention and no plans to stress test. 2. A.Fib - 1. Rate control 2. ASA / plavix 3. See cards progress note on 10/15: not candidate for anti coag per cards    DVT prophylaxis: Lovenox Code Status: Full code per patient herself when I spoke to her tonight, this may need to be further discussed between patient and HPOA in future, as she was DNR when admitted earlier this  month. Family Communication: Spoke with son on cell phone Consults called: None Admission status: Place in [place], [place] [facility] Pager [phone] from 7PM-7AM  If 7AM-7PM, please contact the day physician for the patient [email or web address] Password [email or web address]  03/26/2016, 10:54 PM

## 2016-03-27 ENCOUNTER — Encounter (HOSPITAL_COMMUNITY): Payer: Self-pay | Admitting: Cardiology

## 2016-03-27 DIAGNOSIS — F0150 Vascular dementia without behavioral disturbance: Secondary | ICD-10-CM | POA: Diagnosis not present

## 2016-03-27 DIAGNOSIS — N183 Chronic kidney disease, stage 3 unspecified: Secondary | ICD-10-CM | POA: Diagnosis present

## 2016-03-27 DIAGNOSIS — I25119 Atherosclerotic heart disease of native coronary artery with unspecified angina pectoris: Secondary | ICD-10-CM | POA: Diagnosis not present

## 2016-03-27 DIAGNOSIS — R079 Chest pain, unspecified: Secondary | ICD-10-CM | POA: Diagnosis not present

## 2016-03-27 DIAGNOSIS — I255 Ischemic cardiomyopathy: Secondary | ICD-10-CM | POA: Diagnosis present

## 2016-03-27 LAB — TROPONIN I
Troponin I: 0.1 ng/mL (ref <0.03)
Troponin I: 0.08 ng/mL (ref <0.03)
Troponin I: 0.05 ng/mL (ref <0.03)

## 2016-03-27 LAB — MRSA PCR SCREENING: MRSA by PCR: NEGATIVE

## 2016-03-27 MED ORDER — ZOLPIDEM TARTRATE 5 MG PO TABS
5.0000 mg | ORAL_TABLET | Freq: Every evening | ORAL | Status: DC | PRN
  Administered 2016-03-27 (×2): 5 mg via ORAL
  Filled 2016-03-27 (×2): qty 1

## 2016-03-27 MED ORDER — PANTOPRAZOLE SODIUM 20 MG PO TBEC
20.0000 mg | DELAYED_RELEASE_TABLET | Freq: Every day | ORAL | Status: DC
  Administered 2016-03-27 – 2016-03-28 (×2): 20 mg via ORAL
  Filled 2016-03-27 (×2): qty 1

## 2016-03-27 MED ORDER — ISOSORBIDE MONONITRATE ER 60 MG PO TB24
120.0000 mg | ORAL_TABLET | Freq: Every day | ORAL | Status: DC
  Administered 2016-03-27: 120 mg via ORAL
  Filled 2016-03-27: qty 2

## 2016-03-27 MED ORDER — ISOSORBIDE MONONITRATE ER 60 MG PO TB24
180.0000 mg | ORAL_TABLET | Freq: Every day | ORAL | Status: DC
  Administered 2016-03-28: 180 mg via ORAL
  Filled 2016-03-27: qty 3

## 2016-03-27 MED ORDER — GI COCKTAIL ~~LOC~~
30.0000 mL | Freq: Two times a day (BID) | ORAL | Status: DC | PRN
  Administered 2016-03-27: 30 mL via ORAL
  Filled 2016-03-27: qty 30

## 2016-03-27 MED ORDER — ASPIRIN EC 81 MG PO TBEC
81.0000 mg | DELAYED_RELEASE_TABLET | Freq: Every day | ORAL | Status: DC
  Administered 2016-03-28: 81 mg via ORAL
  Filled 2016-03-27: qty 1

## 2016-03-27 MED ORDER — LIDOCAINE 5 % EX PTCH
1.0000 | MEDICATED_PATCH | CUTANEOUS | Status: DC
  Administered 2016-03-27 – 2016-03-28 (×2): 1 via TRANSDERMAL
  Filled 2016-03-27 (×2): qty 1

## 2016-03-27 NOTE — Consult Note (Signed)
Reason for Consult:   Chest pain  Requesting Physician: [facility] Primary Cardiologist Dr Wynonia Lawman  HPI:   82 y/o female followed by Dr Wynonia Lawman with a history of multiple prior CABG operation and a LM PCI in 2003. Last cath in 2007 showed a patent LIMA-CFX, RIMA-LAD and patent LM PCI site. Other problems include PAF, CRI-3, HTN, PVD, and dementia. She was just admitted 03/02/16-03/06/16 with back pain and ruled in for a NSTEMI-pk Troponin 6.11. Her echo showed an EF of 45-50%. Dr Wynonia Lawman felt it would be best to treat her conservatively. He did not feel she was a candidate for anticoagulation, CHADs VASc=6. She was discharged to SNF.  She is admitted now from SNF with chest pain. Her POC marker was negative but her first Troponin I was 1.0. This has since dropped to 0.05. She is unable to tell who her cardiologist is or details about her chest pain. She did give me the correct month, day and year when asked the date. She says her chest feels OK now and she is ready to go home "right now".   PMHx:  Past Medical History:  Diagnosis Date  . Aortic valve disorder   . Chronic diastolic heart failure ([place])   . Constipation   . Coronary artery disease   . Dementia   . Diabetes mellitus without complication ([place])   . GERD (gastroesophageal reflux disease)   . Hyperlipidemia   . Hypertensive heart disease without CHF   . Peripheral neuropathy ([place])   . Peripheral vascular disease [facility])     Past Surgical History:  Procedure Laterality Date  . BREAST ENHANCEMENT SURGERY    . CAROTID ENDARTERECTOMY  12/2001  . [place], [id number]   By Dr. Camillo Flaming  . LAPAROSCOPIC CHOLECYSTECTOMY  2001  . Repair of left iliac psuedoaneurysm following cath  2006  . Repair of lymphocoele    . TONSILLECTOMY      SOCHx:  reports that she has quit smoking. She has never used smokeless tobacco. She reports that she does not drink alcohol or use  drugs.  FAMHx: Three brother died of CAD  ALLERGIES: Allergies  Allergen Reactions  . Clindamycin/Lincomycin Rash and Other (See Comments)    Heart problems     ROS: Review of Systems: General: negative for chills, fever, night sweats or weight changes.  Cardiovascular: negative for dyspnea on exertion, edema, orthopnea, palpitations, paroxysmal nocturnal dyspnea or shortness of breath HEENT: negative for any visual disturbances, blindness, glaucoma Dermatological: negative for rash Respiratory: negative for cough, hemoptysis, or wheezing Urologic: negative for hematuria or dysuria Abdominal: negative for nausea, vomiting, diarrhea, bright red blood per rectum, melena, or hematemesis Neurologic: negative for visual changes, syncope, or dizziness Musculoskeletal: negative for back pain, joint pain, or swelling Psych: cooperative and appropriate All other systems reviewed and are otherwise negative except as noted above.   HOME MEDICATIONS: Prior to Admission medications   Medication Sig Start Date End Date Taking? Authorizing Provider  amLODipine (NORVASC) 5 MG tablet Take 1 tablet (5 mg total) by mouth 2 (two) times daily after a meal. 03/06/16  Yes Howell, DO  aspirin EC 325 MG EC tablet Take 1 tablet (325 mg total) by mouth daily. 03/07/16  Yes Jessie, DO  atorvastatin (LIPITOR) 20 MG tablet Take 1 tablet (20 mg total) by mouth daily at 6 PM. 03/06/16  Yes Kerney Elbe, DO  clopidogrel (PLAVIX) 75  MG tablet Take 75 mg by mouth daily.   Yes Historical Provider, MD  ezetimibe (ZETIA) 10 MG tablet Take 10 mg by mouth daily.   Yes Historical Provider, MD  feeding supplement, ENSURE ENLIVE, (ENSURE ENLIVE) LIQD Take 237 mLs by mouth 2 (two) times daily between meals. 03/06/16  Yes [place], DO  hydrALAZINE (APRESOLINE) 50 MG tablet Take 1 tablet (50 mg total) by mouth every 8 (eight) hours. 03/06/16  Yes Mustang, DO  isosorbide  mononitrate (IMDUR) 30 MG 24 hr tablet Take 3 tablets (90 mg total) by mouth daily. 03/07/16  Yes [place], DO  memantine (NAMENDA) 10 MG tablet Take 10 mg by mouth 2 (two) times daily.   Yes Historical Provider, MD  metoprolol tartrate (LOPRESSOR) 25 MG tablet Take 1 tablet (25 mg total) by mouth 2 (two) times daily. 03/06/16  Yes [place]: I have reviewed the patient's current medications.  VITALS: Blood pressure (!) 147/73, pulse (!) 55, temperature 97.7 F (36.5 C), temperature source Oral, resp. rate 12, weight 113 lb 3.2 oz (51.3 kg), SpO2 96 %.  PHYSICAL EXAM: General appearance: alert, cooperative, appears stated age and no distress Neck: no JVD and bilateral carotid bruits Lungs: clear to auscultation bilaterally Heart: regular rate and rhythm and soft systolic murmur AOV Chest: non tender to palpation. She has breast implants in place.  Abdomen: soft, non-tender; bowel sounds normal; no masses,  no organomegaly and mid line surgical scar Extremities: extremities normal, atraumatic, no cyanosis or edema Pulses: 2+ and symmetric Skin: Skin color, texture, turgor normal. No rashes or lesions Neurologic: Grossly normal  LABS: Results for orders placed or performed during the hospital encounter of 03/26/16 (from the past 24 hour(s))  Basic metabolic panel     Status: Abnormal   Collection Time: 03/26/16  8:56 PM  Result Value Ref Range   Sodium 138 135 - 145 mmol/L   Potassium 3.8 3.5 - 5.1 mmol/L   Chloride 105 101 - 111 mmol/L   CO2 25 22 - 32 mmol/L   Glucose, Bld 111 (H) 65 - 99 mg/dL   BUN 19 6 - 20 mg/dL   Creatinine, Ser 1.28 (H) 0.44 - 1.00 mg/dL   Calcium 9.4 8.9 - 10.3 mg/dL   GFR calc non Af Amer 37 (L) >60 mL/min   GFR calc Af Amer 43 (L) >60 mL/min   Anion gap 8 5 - 15  CBC     Status: None   Collection Time: 03/26/16  8:56 PM  Result Value Ref Range   WBC 7.2 4.0 - 10.5 K/uL   RBC 4.04 3.87 - 5.11 MIL/uL    Hemoglobin 12.1 12.0 - 15.0 g/dL   HCT 36.4 36.0 - 46.0 %   MCV 90.1 78.0 - 100.0 fL   MCH 30.0 26.0 - 34.0 pg   MCHC 33.2 30.0 - 36.0 g/dL   RDW 13.2 11.5 - 15.5 %   Platelets 257 150 - 400 K/uL  Differential     Status: None   Collection Time: 03/26/16  9:09 PM  Result Value Ref Range   Neutrophils Relative % 63 %   Neutro Abs 4.5 1.7 - 7.7 K/uL   Lymphocytes Relative 25 %   Lymphs Abs 1.8 0.7 - 4.0 K/uL   Monocytes Relative 8 %   Monocytes Absolute 0.6 0.1 - 1.0 K/uL   Eosinophils Relative 4 %   Eosinophils Absolute 0.3 0.0 - 0.7  K/uL   Basophils Relative 0 %   Basophils Absolute 0.0 0.0 - 0.1 K/uL  I-stat troponin, ED     Status: None   Collection Time: 03/26/16  9:29 PM  Result Value Ref Range   Troponin i, poc 0.06 0.00 - 0.08 ng/mL   Comment 3          Troponin I-serum (0, 3, 6 hours)     Status: Abnormal   Collection Time: 03/27/16 12:13 AM  Result Value Ref Range   Troponin I 0.10 (HH) <0.03 ng/mL  Troponin I-serum (0, 3, 6 hours)     Status: Abnormal   Collection Time: 03/27/16  2:04 AM  Result Value Ref Range   Troponin I 0.08 (HH) <0.03 ng/mL  Troponin I-serum (0, 3, 6 hours)     Status: Abnormal   Collection Time: 03/27/16  4:31 AM  Result Value Ref Range   Troponin I 0.05 (HH) <0.03 ng/mL  MRSA PCR Screening     Status: None   Collection Time: 03/27/16  5:48 AM  Result Value Ref Range   MRSA by PCR NEGATIVE NEGATIVE    EKG: NSR  IMAGING: Dg Chest 2 View  Result Date: 03/26/2016 CLINICAL DATA:  Chest pain. EXAM: CHEST  2 VIEW COMPARISON:  Chest radiograph 03/02/2016 FINDINGS: Mediastinal surgical clips and median sternotomy wires are again noted. There are bilateral breast implants. Cardiomediastinal contours are normal. No focal airspace consolidation or pulmonary edema. No pleural effusion or pneumothorax. IMPRESSION: No active cardiopulmonary disease. Electronically Signed   By: Ulyses Jarred M.D.   On: 03/26/2016 21:58    IMPRESSION: Principal  Problem: Chest pain, moderate risk- doubt our recommendations will change from Dr Thurman Coyer recomendations from last hospitalization- conservative Rx  Active Problems:   Hx of CABG-3 s/p prior surgeries, and a LM stent   Dementia   Hypertensive heart disease without CHF   Paroxysmal atrial fibrillation - she has maintained NSR   during the previous admission and so far this admission. Not   on anticoagulation secondary to dementia   NSTEMI- Toponin 6.11 on 03/03/16, Troponin 1.0 this adm   PVD   Mild aortic stenosis   Cardiomyopathy, ischemic-EF 45% 10   RECOMMENDATION: Decrease ASA to 81 mg as she is on Plavix. She is on Imdur 120 mg, beta blocker, and Norvasc.   Time Spent Directly with Patient: 58 minutes  Kerin Ransom, [place] beeper 03/27/2016, 11:16 AM   History and all data above reviewed.  Patient examined.  I agree with the findings as above.  The patient exam reveals COR:RRR  ,  Lungs: Clear  ,  Abd: Positive bowel sounds, no rebound no guarding, Ext No edema  .  All available labs, radiology testing, previous records reviewed. Agree with documented assessment and plan. Chest pain:  She does have a mild troponin elevation.  She is on good medications.  No further invasive testing is indicated as suggested by Dr. Thurman Coyer last note.  Studies suggest that the therapeutic dose of Imdur is between 120 and 240 mg.  I will increase to 180 mg daily.    Jeneen Rinks Jyra Lagares  1:35 PM  03/27/2016

## 2016-03-27 NOTE — Care Management Obs Status (Signed)
[place] NOTIFICATION   Patient Details  Name: [name] MRN: [id number] Date of Birth: 10/07/1926   Medicare Observation Status Notification Given:  Yes    Bethena Roys, RN 03/27/2016, 12:23 PM

## 2016-03-27 NOTE — Progress Notes (Signed)
PROGRESS NOTE    [name]  [id number] DOB: [birth year] DOA: 03/26/2016 PCP: Mayra Neer, MD   Outpatient Specialists:     Brief Narrative:  [name] is a 82 y.o. female with medical history significant of CAD, CABG, aortic valve disease, A.Fib.  Patient presents to the ED with L sided chest and back pain.  States that pain like this has always been associated with her heart in the past.  Recent admit earlier this month for chest pain and elevated troponin but that was due to A.Fib RVR, today her HR is 60.  Some nausea, no vomiting.  Has baseline dementia which makes details such as onset, duration, etc difficult to get.  Pain is persistant still, severe.   Assessment & Plan:   Principal Problem:   Chest pain, moderate coronary artery risk Active Problems:   Dementia   Hx of CABG-3 surgeries, and LM stent   Hypertensive heart disease without CHF   Peripheral vascular disease ([facility])   Mild aortic stenosis   Paroxysmal atrial fibrillation ([facility])   NSTEMI- Toponin 6.11 on 03/03/16   Cardiomyopathy, ischemic   CRI (chronic renal insufficiency), stage 3 (moderate)   Chest pain mod risk - HEART score of 6 Serial trops-decreaseing Seen by cards last admission-- medical management only-- no role for cath/ST Place lidocaine patch on area of back where she is c/o pain ? Pain from hiatal hernia-- add PRN GI cocktail and protonix  A.Fib - Rate control ASA / plavix See cards progress note on 10/15: not candidate for anti coag per cards   Dementia  -patient unable to make medical decision as she does not have capacity    DVT prophylaxis:  Lovenox   Code Status: Full Code   Family Communication: Called son  Disposition Plan:  Home with 24/7 vs snf   Consultants:   cards    Subjective: C/o left sided chest pain  Objective: Vitals:   03/27/16 0500 03/27/16 0700 03/27/16 0820 03/27/16 1200  BP: (!) 161/61  (!) 147/73 123/61  Pulse: 63  (!) 55    Resp: 14 10 12 10   Temp: 97.9 F (36.6 C)  97.7 F (36.5 C)   TempSrc:   Oral   SpO2: 96% 96%  96%  Weight: 51.3 kg (113 lb 3.2 oz)       Intake/Output Summary (Last 24 hours) at 03/27/16 1252 Last data filed at 03/27/16 1239  Gross per 24 hour  Intake              250 ml  Output              400 ml  Net             -150 ml   Filed Weights   03/27/16 0500  Weight: 51.3 kg (113 lb 3.2 oz)    Examination:  General exam: Appears calm and comfortable  Respiratory system: Clear to auscultation. Respiratory effort normal. Cardiovascular system: S1 & S2 heard, RRR. No JVD, murmurs, rubs, gallops or clicks. No pedal edema. Gastrointestinal system: Abdomen is nondistended, soft and nontender. No organomegaly or masses felt. Normal bowel sounds heard. Central nervous system: Alert but not oriented    Data Reviewed: I have personally reviewed following labs and imaging studies  CBC:  Recent Labs Lab 03/26/16 2056 03/26/16 2109  WBC 7.2  --   NEUTROABS  --  4.5  HGB 12.1  --   HCT 36.4  --   MCV 90.1  --  PLT 257  --    Basic Metabolic Panel:  Recent Labs Lab 03/26/16 2056  NA 138  K 3.8  CL 105  CO2 25  GLUCOSE 111*  BUN 19  CREATININE 1.28*  CALCIUM 9.4   GFR: Estimated Creatinine Clearance: 25 mL/min (by C-G formula based on SCr of 1.28 mg/dL (H)). Liver Function Tests: No results for input(s): AST, ALT, ALKPHOS, BILITOT, PROT, ALBUMIN in the last 168 hours. No results for input(s): LIPASE, AMYLASE in the last 168 hours. No results for input(s): AMMONIA in the last 168 hours. Coagulation Profile: No results for input(s): INR, PROTIME in the last 168 hours. Cardiac Enzymes:  Recent Labs Lab 03/27/16 0013 03/27/16 0204 03/27/16 0431  TROPONINI 0.10* 0.08* 0.05*   BNP (last 3 results) No results for input(s): PROBNP in the last 8760 hours. HbA1C: No results for input(s): HGBA1C in the last 72 hours. CBG: No results for input(s): GLUCAP  in the last 168 hours. Lipid Profile: No results for input(s): CHOL, HDL, LDLCALC, TRIG, CHOLHDL, LDLDIRECT in the last 72 hours. Thyroid Function Tests: No results for input(s): TSH, T4TOTAL, FREET4, T3FREE, THYROIDAB in the last 72 hours. Anemia Panel: No results for input(s): VITAMINB12, FOLATE, FERRITIN, TIBC, IRON, RETICCTPCT in the last 72 hours. Urine analysis:    Component Value Date/Time   COLORURINE YELLOW 01/31/2016 1233   APPEARANCEUR CLEAR 01/31/2016 1233   LABSPEC 1.015 01/31/2016 1233   PHURINE 6.5 01/31/2016 1233   GLUCOSEU NEGATIVE 01/31/2016 1233   HGBUR NEGATIVE 01/31/2016 1233   BILIRUBINUR NEGATIVE 01/31/2016 1233   KETONESUR NEGATIVE 01/31/2016 1233   PROTEINUR NEGATIVE 01/31/2016 1233   UROBILINOGEN 1.0 05/23/2012 0257   NITRITE NEGATIVE 01/31/2016 1233   LEUKOCYTESUR NEGATIVE 01/31/2016 1233      Recent Results (from the past 240 hour(s))  MRSA PCR Screening     Status: None   Collection Time: 03/27/16  5:48 AM  Result Value Ref Range Status   MRSA by PCR NEGATIVE NEGATIVE Final    Comment:        The GeneXpert MRSA Assay (FDA approved for NASAL specimens only), is one component of a comprehensive MRSA colonization surveillance program. It is not intended to diagnose MRSA infection nor to guide or monitor treatment for MRSA infections.       Anti-infectives    None       Radiology Studies: Dg Chest 2 View  Result Date: 03/26/2016 CLINICAL DATA:  Chest pain. EXAM: CHEST  2 VIEW COMPARISON:  Chest radiograph 03/02/2016 FINDINGS: Mediastinal surgical clips and median sternotomy wires are again noted. There are bilateral breast implants. Cardiomediastinal contours are normal. No focal airspace consolidation or pulmonary edema. No pleural effusion or pneumothorax. IMPRESSION: No active cardiopulmonary disease. Electronically Signed   By: Ulyses Jarred M.D.   On: 03/26/2016 21:58        Scheduled Meds: . amLODipine  5 mg Oral BID PC  .  [START ON 03/28/2016] aspirin  81 mg Oral Daily  . atorvastatin  20 mg Oral q1800  . clopidogrel  75 mg Oral Daily  . enoxaparin (LOVENOX) injection  40 mg Subcutaneous QHS  . ezetimibe  10 mg Oral Daily  . feeding supplement (ENSURE ENLIVE)  237 mL Oral BID BM  . hydrALAZINE  50 mg Oral Q8H  . isosorbide mononitrate  120 mg Oral Daily  . lidocaine  1 patch Transdermal Q24H  . memantine  10 mg Oral BID  . metoprolol tartrate  25 mg Oral BID  Continuous Infusions:   LOS: 0 days    Time spent: 25 min    Day, DO [facility] Pager [phone]  If 7PM-7AM, please contact night-coverage [email or web address] Password [email or web address] 03/27/2016, 12:52 PM

## 2016-03-27 NOTE — Evaluation (Signed)
Physical Therapy Evaluation Patient Details Name: [name] MRN: [id number] DOB: [birth year] Today's Date: 03/27/2016   History of Present Illness  Patient is a 82 yo female with hx of CAd s/p CABG, HTN, HLD, PVD, dementia and CKD presents with chest pain.   Clinical Impression  Patient presents with baseline confusion secondary to hx of dementia, generalized weakness, impaired safety awareness and impaired balance/mobility. Tolerated gait training with Min A for balance/safety due to multiple episodes of instability. Pt is NOT SAFE TO RETURN HOME ALONE. Pt is a high fall risk. Recommend return to SNF. Will follow acutely.     Follow Up Recommendations SNF;Supervision/Assistance - 24 hour    Equipment Recommendations  None recommended by PT    Recommendations for Other Services       Precautions / Restrictions Precautions Precautions: Fall Restrictions Weight Bearing Restrictions: No      Mobility  Bed Mobility Overal bed mobility: Needs Assistance Bed Mobility: Supine to Sit;Sit to Supine     Supine to sit: Min guard;HOB elevated Sit to supine: Min guard;HOB elevated   General bed mobility comments: Increased time; use of rail for support.  Transfers Overall transfer level: Needs assistance Equipment used: Rolling walker (2 wheeled) Transfers: Sit to/from Stand Sit to Stand: Min assist         General transfer comment: Min A to stability, LOB x1 coming to standing.  Ambulation/Gait Ambulation/Gait assistance: Min assist Ambulation Distance (Feet): 120 Feet Assistive device: Rolling walker (2 wheeled) Gait Pattern/deviations: Step-through pattern;Decreased stride length;Shuffle;Narrow base of support Gait velocity: decreased   General Gait Details: Multiple episodes of instability with near LOB requiring Min A for safety. hands on assist at all times. Taking hand off RW at times.  Stairs            Wheelchair Mobility    Modified Rankin  (Stroke Patients Only)       Balance Overall balance assessment: Needs assistance Sitting-balance support: Feet supported;No upper extremity supported Sitting balance-Leahy Scale: Fair     Standing balance support: During functional activity Standing balance-Leahy Scale: Poor Standing balance comment: Reilant on UEs for support in standing. LOB x1 without UE support.                             Pertinent Vitals/Pain Pain Assessment: No/denies pain    Home Living Family/patient expects to be discharged to:: Skilled nursing facility (Pt has refused ALF/SNF in the past) Living Arrangements: Alone Available Help at Discharge: Neighbor;Available PRN/intermittently Type of Home: House Home Access: Stairs to enter Entrance Stairs-Rails: None Entrance Stairs-Number of Steps: 2 Home Layout: One level Home Equipment: None      Prior Function           Comments: Pt not able to provide PLOF due to hx of dementia. Per MD notes, pt admitted from SNF [facility]).     Hand Dominance   Dominant Hand: Right    Extremity/Trunk Assessment   Upper Extremity Assessment: Defer to OT evaluation;Generalized weakness           Lower Extremity Assessment: Generalized weakness      Cervical / Trunk Assessment: Kyphotic  Communication   Communication: No difficulties  Cognition Arousal/Alertness: Awake/alert Behavior During Therapy: WFL for tasks assessed/performed Overall Cognitive Status: No family/caregiver present to determine baseline cognitive functioning  General Comments      Exercises     Assessment/Plan    PT Assessment Patient needs continued PT services  PT Problem List Decreased strength;Decreased activity tolerance;Decreased balance;Decreased mobility;Decreased cognition;Decreased safety awareness;Cardiopulmonary status limiting activity          PT Treatment Interventions DME instruction;Therapeutic  activities;Gait training;Therapeutic exercise;Patient/family education;Balance training;Functional mobility training    PT Goals (Current goals can be found in the Care Plan section)  Acute Rehab PT Goals Patient Stated Goal: to go home PT Goal Formulation: With patient Time For Goal Achievement: 04/03/16 Potential to Achieve Goals: Good    Frequency Min 2X/week   Barriers to discharge Decreased caregiver support lives alone    Co-evaluation               End of Session Equipment Utilized During Treatment: Gait belt Activity Tolerance: Patient tolerated treatment well Patient left: in bed;with call bell/phone within reach;with bed alarm set;Other (comment) (MD in room) Nurse Communication: Mobility status    Functional Assessment Tool Used: clinical judgment Functional Limitation: Mobility: Walking and moving around Mobility: Walking and Moving Around Current Status [id number]): At least 20 percent but less than 40 percent impaired, limited or restricted Mobility: Walking and Moving Around Goal Status [phone]): At least 1 percent but less than 20 percent impaired, limited or restricted    Time: 1322-1343 PT Time Calculation (min) (ACUTE ONLY): 21 min   Charges:   PT Evaluation $PT Eval Moderate Complexity: 1 Procedure     PT G Codes:   PT G-Codes **NOT FOR INPATIENT CLASS** Functional Assessment Tool Used: clinical judgment Functional Limitation: Mobility: Walking and moving around Mobility: Walking and Moving Around Current Status [id number]): At least 20 percent but less than 40 percent impaired, limited or restricted Mobility: Walking and Moving Around Goal Status [phone]): At least 1 percent but less than 20 percent impaired, limited or restricted    [place] 03/27/2016, 2:30 PM Wray Kearns, [place], DPT [phone]

## 2016-03-28 DIAGNOSIS — R41841 Cognitive communication deficit: Secondary | ICD-10-CM | POA: Diagnosis not present

## 2016-03-28 DIAGNOSIS — F0150 Vascular dementia without behavioral disturbance: Secondary | ICD-10-CM | POA: Diagnosis not present

## 2016-03-28 DIAGNOSIS — N289 Disorder of kidney and ureter, unspecified: Secondary | ICD-10-CM | POA: Diagnosis not present

## 2016-03-28 DIAGNOSIS — I739 Peripheral vascular disease, unspecified: Secondary | ICD-10-CM | POA: Diagnosis not present

## 2016-03-28 DIAGNOSIS — I110 Hypertensive heart disease with heart failure: Secondary | ICD-10-CM | POA: Diagnosis not present

## 2016-03-28 DIAGNOSIS — I350 Nonrheumatic aortic (valve) stenosis: Secondary | ICD-10-CM | POA: Diagnosis not present

## 2016-03-28 DIAGNOSIS — I25119 Atherosclerotic heart disease of native coronary artery with unspecified angina pectoris: Secondary | ICD-10-CM | POA: Diagnosis not present

## 2016-03-28 DIAGNOSIS — I2510 Atherosclerotic heart disease of native coronary artery without angina pectoris: Secondary | ICD-10-CM | POA: Diagnosis not present

## 2016-03-28 DIAGNOSIS — K219 Gastro-esophageal reflux disease without esophagitis: Secondary | ICD-10-CM | POA: Diagnosis not present

## 2016-03-28 DIAGNOSIS — E785 Hyperlipidemia, unspecified: Secondary | ICD-10-CM | POA: Diagnosis not present

## 2016-03-28 DIAGNOSIS — K5900 Constipation, unspecified: Secondary | ICD-10-CM | POA: Diagnosis not present

## 2016-03-28 DIAGNOSIS — M6281 Muscle weakness (generalized): Secondary | ICD-10-CM | POA: Diagnosis not present

## 2016-03-28 DIAGNOSIS — E782 Mixed hyperlipidemia: Secondary | ICD-10-CM | POA: Diagnosis not present

## 2016-03-28 DIAGNOSIS — I1310 Hypertensive heart and chronic kidney disease without heart failure, with stage 1 through stage 4 chronic kidney disease, or unspecified chronic kidney disease: Secondary | ICD-10-CM | POA: Diagnosis not present

## 2016-03-28 DIAGNOSIS — R531 Weakness: Secondary | ICD-10-CM | POA: Diagnosis not present

## 2016-03-28 DIAGNOSIS — I119 Hypertensive heart disease without heart failure: Secondary | ICD-10-CM | POA: Diagnosis not present

## 2016-03-28 DIAGNOSIS — N183 Chronic kidney disease, stage 3 (moderate): Secondary | ICD-10-CM | POA: Diagnosis not present

## 2016-03-28 DIAGNOSIS — F322 Major depressive disorder, single episode, severe without psychotic features: Secondary | ICD-10-CM | POA: Diagnosis not present

## 2016-03-28 DIAGNOSIS — R278 Other lack of coordination: Secondary | ICD-10-CM | POA: Diagnosis not present

## 2016-03-28 DIAGNOSIS — R0789 Other chest pain: Secondary | ICD-10-CM | POA: Diagnosis not present

## 2016-03-28 DIAGNOSIS — E46 Unspecified protein-calorie malnutrition: Secondary | ICD-10-CM | POA: Diagnosis not present

## 2016-03-28 DIAGNOSIS — Z7982 Long term (current) use of aspirin: Secondary | ICD-10-CM | POA: Diagnosis not present

## 2016-03-28 DIAGNOSIS — Z9181 History of falling: Secondary | ICD-10-CM | POA: Diagnosis not present

## 2016-03-28 DIAGNOSIS — I255 Ischemic cardiomyopathy: Secondary | ICD-10-CM | POA: Diagnosis not present

## 2016-03-28 DIAGNOSIS — M545 Low back pain: Secondary | ICD-10-CM | POA: Diagnosis not present

## 2016-03-28 DIAGNOSIS — R262 Difficulty in walking, not elsewhere classified: Secondary | ICD-10-CM | POA: Diagnosis not present

## 2016-03-28 DIAGNOSIS — R609 Edema, unspecified: Secondary | ICD-10-CM | POA: Diagnosis not present

## 2016-03-28 DIAGNOSIS — I5043 Acute on chronic combined systolic (congestive) and diastolic (congestive) heart failure: Secondary | ICD-10-CM | POA: Diagnosis not present

## 2016-03-28 DIAGNOSIS — E1122 Type 2 diabetes mellitus with diabetic chronic kidney disease: Secondary | ICD-10-CM | POA: Diagnosis not present

## 2016-03-28 DIAGNOSIS — I480 Paroxysmal atrial fibrillation: Secondary | ICD-10-CM | POA: Diagnosis not present

## 2016-03-28 DIAGNOSIS — R079 Chest pain, unspecified: Secondary | ICD-10-CM | POA: Diagnosis not present

## 2016-03-28 DIAGNOSIS — I209 Angina pectoris, unspecified: Secondary | ICD-10-CM | POA: Diagnosis not present

## 2016-03-28 DIAGNOSIS — I252 Old myocardial infarction: Secondary | ICD-10-CM | POA: Diagnosis not present

## 2016-03-28 DIAGNOSIS — Z951 Presence of aortocoronary bypass graft: Secondary | ICD-10-CM | POA: Diagnosis not present

## 2016-03-28 DIAGNOSIS — E1151 Type 2 diabetes mellitus with diabetic peripheral angiopathy without gangrene: Secondary | ICD-10-CM | POA: Diagnosis not present

## 2016-03-28 DIAGNOSIS — F0390 Unspecified dementia without behavioral disturbance: Secondary | ICD-10-CM | POA: Diagnosis not present

## 2016-03-28 MED ORDER — POLYETHYLENE GLYCOL 3350 17 G PO PACK
17.0000 g | PACK | Freq: Every day | ORAL | Status: DC
  Administered 2016-03-28: 17 g via ORAL
  Filled 2016-03-28: qty 1

## 2016-03-28 MED ORDER — LIDOCAINE 5 % EX PTCH: 1.0000 | MEDICATED_PATCH | CUTANEOUS | 0 refills | Status: DC

## 2016-03-28 MED ORDER — PANTOPRAZOLE SODIUM 20 MG PO TBEC: 20.0000 mg | DELAYED_RELEASE_TABLET | Freq: Every day | ORAL | Status: DC

## 2016-03-28 MED ORDER — ISOSORBIDE MONONITRATE ER 60 MG PO TB24: 180.0000 mg | ORAL_TABLET | Freq: Every day | ORAL | Status: DC

## 2016-03-28 MED ORDER — POLYETHYLENE GLYCOL 3350 17 G PO PACK: 17.0000 g | PACK | Freq: Every day | ORAL | 0 refills | Status: DC

## 2016-03-28 MED ORDER — ASPIRIN 81 MG PO TBEC: 81.0000 mg | DELAYED_RELEASE_TABLET | Freq: Every day | ORAL | Status: DC

## 2016-03-28 NOTE — Clinical Social Work Placement (Signed)
   CLINICAL SOCIAL WORK PLACEMENT  NOTE  Date:  03/28/2016  Patient Details  Name: [name] MRN: [id number] Date of Birth: 12/03/1926  Clinical Social Work is seeking post-discharge placement for this patient at the [facility] level of care (*CSW will initial, date and re-position this form in  chart as items are completed):  Yes   Patient/family provided with [place] Work Department's list of facilities offering this level of care within the geographic area requested by the patient (or if unable, by the patient's family).  Yes   Patient/family informed of their freedom to choose among providers that offer the needed level of care, that participate in Medicare, Medicaid or managed care program needed by the patient, have an available bed and are willing to accept the patient.  Yes   Patient/family informed of [facility]'s ownership interest in [facility] and [facility], as well as of the fact that they are under no obligation to receive care at these facilities.  PASRR submitted to EDS on       PASRR number received on       Existing PASRR number confirmed on 03/28/16     FL2 transmitted to all facilities in geographic area requested by pt/family on 03/28/16     FL2 transmitted to all facilities within larger geographic area on       Patient informed that his/her managed care company has contracts with or will negotiate with certain facilities, including the following:        Yes   Patient/family informed of bed offers received.  Patient chooses bed at [facility]     Physician recommends and patient chooses bed at      Patient to be transferred to [facility] on 03/28/16.  Patient to be transferred to facility by Ambulance     Patient family notified on 03/28/16 of transfer.  Name of family member notified:  Patient son, [name], over the phone     PHYSICIAN Please prepare priority discharge summary,  including medications, Please prepare prescriptions, Please sign FL2     Additional Comment:    Barbette Or, [place]

## 2016-03-28 NOTE — Discharge Summary (Signed)
Physician Discharge Summary  [name] [id number] DOB: [birth year] DOA: 03/26/2016  PCP: Mayra Neer, MD  Admit date: 03/26/2016 Discharge date: 03/28/2016   Recommendations for Outpatient Follow-Up:   1. SNF 2. Bowel regimen 3. Consider palliative referral 4.    Discharge Diagnosis:   Principal Problem:   Chest pain, moderate coronary artery risk Active Problems:   Dementia   Hx of CABG-3 surgeries, and LM stent   Hypertensive heart disease without CHF   Peripheral vascular disease (HCC)   Mild aortic stenosis   Paroxysmal atrial fibrillation (HCC)   NSTEMI- Toponin 6.11 on 03/03/16   Cardiomyopathy, ischemic   CRI (chronic renal insufficiency), stage 3 (moderate)   Discharge disposition: SNF:  Discharge Condition: stable  Diet recommendation: Low sodium, heart healthy  Wound care: None.   History of Present Illness:   [name] is a 82 y.o. female with medical history significant of CAD, CABG, aortic valve disease, A.Fib.  Patient presents to the ED with L sided chest and back pain.  States that pain like this has always been associated with her heart in the past.  Recent admit earlier this month for chest pain and elevated troponin but that was due to A.Fib RVR, today her HR is 60.  Some nausea, no vomiting.  Has baseline dementia which makes details such as onset, duration, etc difficult to get.  Pain is persistant still, severe.   Hospital Course by Problem:   Chest pain mod risk - HEART score of 6 Serial trops-decreaseing Seen by cards last admission-- medical management only-- no role for cath/ST--per cards- increase imdur to 180 mg daily Place lidocaine patch on area of back where she is c/o pain ? Pain from hiatal hernia-- added protonix  A.Fib - Rate control ASA / plavix See cards progress note on 10/15: not candidate for anti coag per cards  Dementia             -patient unable to make medical decision as she does not  have capacity    Medical Consultants:   cards  Discharge Exam:   Vitals:   03/28/16 0829 03/28/16 0833  BP: (!) 162/64   Pulse:  65  Resp:    Temp:  97.9 F (36.6 C)   Vitals:   03/28/16 0011 03/28/16 0600 03/28/16 0829 03/28/16 0833  BP: (!) 169/60 (!) 162/60 (!) 162/64   Pulse: 65   65  Resp: 16     Temp: 97.7 F (36.5 C) 98.1 F (36.7 C)  97.9 F (36.6 C)  TempSrc:  Oral    SpO2: 96% 96%    Weight:  51.3 kg (113 lb)      Gen:  NAD    The results of significant diagnostics from this hospitalization (including imaging, microbiology, ancillary and laboratory) are listed below for reference.     Procedures and Diagnostic Studies:   Dg Chest 2 View  Result Date: 03/26/2016 CLINICAL DATA:  Chest pain. EXAM: CHEST  2 VIEW COMPARISON:  Chest radiograph 03/02/2016 FINDINGS: Mediastinal surgical clips and median sternotomy wires are again noted. There are bilateral breast implants. Cardiomediastinal contours are normal. No focal airspace consolidation or pulmonary edema. No pleural effusion or pneumothorax. IMPRESSION: No active cardiopulmonary disease. Electronically Signed   By: Ulyses Jarred M.D.   On: 03/26/2016 21:58     Labs:   Basic Metabolic Panel:  Recent Labs Lab 03/26/16 2056  NA 138  K 3.8  CL 105  CO2 25  GLUCOSE  111*  BUN 19  CREATININE 1.28*  CALCIUM 9.4   GFR Estimated Creatinine Clearance: 25 mL/min (by C-G formula based on SCr of 1.28 mg/dL (H)). Liver Function Tests: No results for input(s): AST, ALT, ALKPHOS, BILITOT, PROT, ALBUMIN in the last 168 hours. No results for input(s): LIPASE, AMYLASE in the last 168 hours. No results for input(s): AMMONIA in the last 168 hours. Coagulation profile No results for input(s): INR, PROTIME in the last 168 hours.  CBC:  Recent Labs Lab 03/26/16 2056 03/26/16 2109  WBC 7.2  --   NEUTROABS  --  4.5  HGB 12.1  --   HCT 36.4  --   MCV 90.1  --   PLT 257  --    Cardiac  Enzymes:  Recent Labs Lab 03/27/16 0013 03/27/16 0204 03/27/16 0431  TROPONINI 0.10* 0.08* 0.05*   BNP: Invalid input(s): POCBNP CBG: No results for input(s): GLUCAP in the last 168 hours. D-Dimer No results for input(s): DDIMER in the last 72 hours. Hgb A1c No results for input(s): HGBA1C in the last 72 hours. Lipid Profile No results for input(s): CHOL, HDL, LDLCALC, TRIG, CHOLHDL, LDLDIRECT in the last 72 hours. Thyroid function studies No results for input(s): TSH, T4TOTAL, T3FREE, THYROIDAB in the last 72 hours.  Invalid input(s): FREET3 Anemia work up No results for input(s): VITAMINB12, FOLATE, FERRITIN, TIBC, IRON, RETICCTPCT in the last 72 hours. Microbiology Recent Results (from the past 240 hour(s))  MRSA PCR Screening     Status: None   Collection Time: 03/27/16  5:48 AM  Result Value Ref Range Status   MRSA by PCR NEGATIVE NEGATIVE Final    Comment:        The GeneXpert MRSA Assay (FDA approved for NASAL specimens only), is one component of a comprehensive MRSA colonization surveillance program. It is not intended to diagnose MRSA infection nor to guide or monitor treatment for MRSA infections.      Discharge Instructions:   Discharge Instructions    Diet - low sodium heart healthy    Complete by:  As directed    Discharge instructions    Complete by:  As directed    Bowel regimen for daily BMs   Increase activity slowly    Complete by:  As directed        Medication List    TAKE these medications   amLODipine 5 MG tablet Commonly known as:  NORVASC Take 1 tablet (5 mg total) by mouth 2 (two) times daily after a meal.   aspirin 81 MG EC tablet Take 1 tablet (81 mg total) by mouth daily. Start taking on:  03/29/2016 What changed:  medication strength  how much to take   atorvastatin 20 MG tablet Commonly known as:  LIPITOR Take 1 tablet (20 mg total) by mouth daily at 6 PM.   clopidogrel 75 MG tablet Commonly known as:   PLAVIX Take 75 mg by mouth daily.   ezetimibe 10 MG tablet Commonly known as:  ZETIA Take 10 mg by mouth daily.   feeding supplement (ENSURE ENLIVE) Liqd Take 237 mLs by mouth 2 (two) times daily between meals.   hydrALAZINE 50 MG tablet Commonly known as:  APRESOLINE Take 1 tablet (50 mg total) by mouth every 8 (eight) hours.   isosorbide mononitrate 60 MG 24 hr tablet Commonly known as:  IMDUR Take 3 tablets (180 mg total) by mouth daily. Start taking on:  03/29/2016 What changed:  medication strength  how much to take  lidocaine 5 % Commonly known as:  LIDODERM Place 1 patch onto the skin daily. Remove & Discard patch within 12 hours or as directed by MD   memantine 10 MG tablet Commonly known as:  NAMENDA Take 10 mg by mouth 2 (two) times daily.   metoprolol tartrate 25 MG tablet Commonly known as:  LOPRESSOR Take 1 tablet (25 mg total) by mouth 2 (two) times daily.   pantoprazole 20 MG tablet Commonly known as:  PROTONIX Take 1 tablet (20 mg total) by mouth daily. Start taking on:  03/29/2016   polyethylene glycol packet Commonly known as:  MIRALAX / GLYCOLAX Take 17 g by mouth daily.      Follow-up Information    SHAW,KIMBERLEE, MD Follow up in 1 week(s).   Specialty:  Family Medicine Contact information: [address] [place] [address] [phone]            Time coordinating discharge: 35 min  Signed:  Brylin Stopper Alison Stalling   [facility] 03/28/2016, 10:44 AM

## 2016-03-28 NOTE — Clinical Social Work Note (Signed)
Clinical Social Worker facilitated patient discharge including contacting patient family and facility to confirm patient discharge plans.  Clinical information faxed to facility and family agreeable with plan.  CSW arranged ambulance transport via [facility] to [facility].  RN to call report prior to discharge.  Clinical Social Worker will sign off for now as social work intervention is no longer needed. Please consult us again if new need arises.  Jesse Twana Wileman, LCSW [phone] 

## 2016-03-28 NOTE — NC FL2 (Signed)
[place] MEDICAID FL2 LEVEL OF CARE SCREENING TOOL     IDENTIFICATION  Patient Name: [name] Birthdate: 1926-08-18 Sex: female Admission Date (Current Location): 03/26/2016  [facility] [place] Number:  Herbalist and Address:  [facility], [place] [address], [place], [place] [address]      Provider Number: [id number]  Attending Physician Name and Address:  Geradine Girt, DO  Relative Name and Phone Number:       Current Level of Care: Hospital Recommended Level of Care: [place] Prior Approval Number:    Date Approved/Denied:   PASRR Number: [id number]  Discharge Plan: SNF    Current Diagnoses: Patient Active Problem List   Diagnosis Date Noted  . Cardiomyopathy, ischemic 03/27/2016  . CRI (chronic renal insufficiency), stage 3 (moderate) 03/27/2016  . Chest pain, moderate coronary artery risk 03/26/2016  . Acute on chronic combined systolic and diastolic CHF (congestive heart failure) ([place])   . Atrial fibrillation with rapid ventricular response ([place])   . NSTEMI- Toponin 6.11 on 03/03/16   . Fall   . Paroxysmal atrial fibrillation ([place]) 03/02/2016  . Chest pain 03/02/2016  . Type 2 diabetes mellitus with vascular disease ([place])   . Dementia   . MI (myocardial infarction)   . Hx of CABG-3 surgeries, and LM stent   . Hypertensive heart disease without CHF   . Hyperlipidemia   . Peripheral vascular disease ([place])   . Chronic diastolic heart failure ([place])   . Mild aortic stenosis   . GERD (gastroesophageal reflux disease)     Orientation RESPIRATION BLADDER Height & Weight     Self, Time, Situation, Place  Normal Continent Weight: 51.3 kg (113 lb 3.2 oz) Height:     BEHAVIORAL SYMPTOMS/MOOD NEUROLOGICAL BOWEL NUTRITION STATUS     (NONE) Continent Diet  AMBULATORY STATUS COMMUNICATION OF NEEDS Skin   Extensive Assist Verbally Normal                       Personal Care Assistance Level of  Assistance    Bathing Assistance: Limited assistance   Dressing Assistance: Limited assistance     Functional Limitations Info  Sight, Hearing, Speech Sight Info: Adequate Hearing Info: Adequate Speech Info: Adequate    SPECIAL CARE FACTORS FREQUENCY  PT (By licensed PT), OT (By licensed OT)     PT Frequency: 5/week OT Frequency: 5/week            Contractures Contractures Info: Not present    Additional Factors Info  Code Status, Allergies Code Status Info: Full             Current Medications (03/28/2016):  This is the current hospital active medication list Current Facility-Administered Medications  Medication Dose Route Frequency Provider Last Rate Last Dose  . acetaminophen (TYLENOL) tablet 650 mg  650 mg Oral Q4H PRN Etta Quill, DO   650 mg at 03/27/16 0814  . amLODipine (NORVASC) tablet 5 mg  5 mg Oral BID PC Etta Quill, DO   5 mg at 03/27/16 1757  . aspirin EC tablet 81 mg  81 mg Oral Daily Geradine Girt, DO      . atorvastatin (LIPITOR) tablet 20 mg  20 mg Oral q1800 Etta Quill, DO   20 mg at 03/27/16 1757  . clopidogrel (PLAVIX) tablet 75 mg  75 mg Oral Daily Etta Quill, DO   75 mg at 03/27/16 1107  .  enoxaparin (LOVENOX) injection 40 mg  40 mg Subcutaneous QHS Etta Quill, DO   40 mg at 03/27/16 2113  . ezetimibe (ZETIA) tablet 10 mg  10 mg Oral Daily Etta Quill, DO   10 mg at 03/27/16 1105  . feeding supplement (ENSURE ENLIVE) (ENSURE ENLIVE) liquid 237 mL  237 mL Oral BID BM Etta Quill, DO   237 mL at 03/27/16 1303  . gi cocktail (Maalox,Lidocaine,Donnatal)  30 mL Oral BID PRN Geradine Girt, DO   30 mL at 03/27/16 1302  . hydrALAZINE (APRESOLINE) tablet 50 mg  50 mg Oral Q8H Etta Quill, DO   50 mg at 03/27/16 2113  . isosorbide mononitrate (IMDUR) 24 hr tablet 180 mg  180 mg Oral Daily Minus Breeding, MD      . lidocaine (LIDODERM) 5 % 1 patch  1 patch Transdermal Q24H Geradine Girt, DO   1 patch at 03/27/16 1302   . memantine (NAMENDA) tablet 10 mg  10 mg Oral BID Etta Quill, DO   10 mg at 03/27/16 2113  . metoprolol tartrate (LOPRESSOR) tablet 25 mg  25 mg Oral BID Etta Quill, DO   25 mg at 03/27/16 2113  . ondansetron (ZOFRAN) injection 4 mg  4 mg Intravenous Q6H PRN Etta Quill, DO      . pantoprazole (PROTONIX) EC tablet 20 mg  20 mg Oral Daily Geradine Girt, DO   20 mg at 03/27/16 1302  . zolpidem (AMBIEN) tablet 5 mg  5 mg Oral QHS PRN Jeryl Columbia, NP   5 mg at 03/27/16 2113     Discharge Medications: Please see discharge summary for a list of discharge medications.  Relevant Imaging Results:  Relevant Lab Results:   Additional Information SSN: [id number]  Rigoberto Noel, LCSW

## 2016-03-28 NOTE — Care Management Note (Signed)
Case Management Note  Patient Details  Name: [name] MRN: [id number] Date of Birth: [birth year]  Subjective/Objective:     Pt presented for Chest Pain. Plan will be for d/c to [place] 03-28-16.  Pt has support of son [name] and he is aware.              Action/Plan: CSW assisting with disposition needs. No further needs from CM at this time.   Expected Discharge Date:                  Expected Discharge Plan:  [place]  In-House Referral:  Clinical Social Work  Discharge planning Services  CM Consult  Post Acute Care Choice:  NA Choice offered to:  NA  DME Arranged:  N/A DME Agency:     HH Arranged:  NA HH Agency:  NA  Status of Service:  Completed, signed off  If discussed at [place] of Stay Meetings, dates discussed:    Additional Comments:  Bethena Roys, RN 03/28/2016, 12:30 PM

## 2016-03-28 NOTE — Progress Notes (Signed)
Report called to [facility], Spoke to nurse [place]. List of given meds reviewed with receiving nurse.   Iv and tele removed by Yasmin, NT.

## 2016-03-28 NOTE — Clinical Social Work Note (Signed)
Clinical Social Work Assessment  Patient Details  Name: [name] MRN: [id number] Date of Birth: Mar 05, 1927  Date of referral:  03/28/16               Reason for consult:  Facility Placement, Discharge Planning                Permission sought to share information with:  Facility Sport and exercise psychologist, Family Supports Permission granted to share information::  No (Patient lacks capacity.)  Name::     Engineer, maintenance (IT)::  [employer]  Relationship::  Son  Sport and exercise psychologist Information:     Housing/Transportation Living arrangements for the past 2 months:  Single Family Home Source of Information:  Patient, Adult Children Patient Interpreter Needed:  None Criminal Activity/Legal Involvement Pertinent to Current Situation/Hospitalization:  No - Comment as needed Significant Relationships:  Adult Children Lives with:  Self Do you feel safe going back to the place where you live?  No Need for family participation in patient care:  Yes (Comment)  Care giving concerns:  The patient states she has no concerns and will be returning home, she adamantly refuses placement but also lacks capacity. The patient's son wants the patient to go to [facility].   Social Worker assessment / plan:  CSW completed assessment with son [name]. [name] states that the patient was very happy at [facility] and actually didn't want to go home when discharged from SNF. [name] understands that the patient needs more of a long term plan, whether long term SNF placement, ALF, or memory care placement. CSW explained SNF search/placement process and answered Rick's questions. CSW explained that CSW will follow decisions made by [facility] as the patient has been deemed to lack the capacity to make decisions regarding her disposition.  Employment status:  Disabled (Comment on whether or not currently receiving Disability), Retired Forensic scientist:  Medicare PT Recommendations:  [place] / Referral to community resources:  [place]  Patient/Family's Response to care:  The patient is adamant about returning home, she says she doesn't want to see the social worker again. The patient's son is appreciative of CSW involvement.  Patient/Family's Understanding of and Emotional Response to Diagnosis, Current Treatment, and Prognosis:  The patient's son appears to have a good understanding of the reason for the patient's admission and need for placement at discharge.  Emotional Assessment Appearance:  Appears stated age Attitude/Demeanor/Rapport:  Other (The patient is defensive when discussing placement. She adamantly refuses.) Affect (typically observed):  Defensive Orientation:  Oriented to Self Alcohol / Substance use:  Not Applicable Psych involvement (Current and /or in the community):  No (Comment)  Discharge Needs  Concerns to be addressed:  Discharge Planning Concerns Readmission within the last 30 days:  Yes Current discharge risk:  Chronically ill, Cognitively Impaired, Physical Impairment, Lives alone Barriers to Discharge:  Continued Medical Work up   Fredderick Phenix B, [place] 03/28/2016, 1:06 AM

## 2016-03-28 NOTE — Clinical Social Work Placement (Signed)
   CLINICAL SOCIAL WORK PLACEMENT  NOTE  Date:  03/28/2016  Patient Details  Name: [name] MRN: [id number] Date of Birth: [birth year]  Clinical Social Work is seeking post-discharge placement for this patient at the [place] level of care (*CSW will initial, date and re-position this form in  chart as items are completed):  Yes   Patient/family provided with [place] Work Department's list of facilities offering this level of care within the geographic area requested by the patient (or if unable, by the patient's family).  Yes   Patient/family informed of their freedom to choose among providers that offer the needed level of care, that participate in Medicare, Medicaid or managed care program needed by the patient, have an available bed and are willing to accept the patient.  Yes   Patient/family informed of [facility]'s ownership interest in [facility] and [facility], as well as of the fact that they are under no obligation to receive care at these facilities.  PASRR submitted to EDS on       PASRR number received on       Existing PASRR number confirmed on 03/28/16     FL2 transmitted to all facilities in geographic area requested by pt/family on 03/28/16     FL2 transmitted to all facilities within larger geographic area on       Patient informed that his/her managed care company has contracts with or will negotiate with certain facilities, including the following:            Patient/family informed of bed offers received.  Patient chooses bed at       Physician recommends and patient chooses bed at      Patient to be transferred to   on  .  Patient to be transferred to facility by       Patient family notified on   of transfer.  Name of family member notified:        PHYSICIAN Please prepare priority discharge summary, including medications, Please prepare prescriptions, Please sign FL2     Additional Comment:     _______________________________________________ Rigoberto Noel, LCSW 03/28/2016, 1:19 AM

## 2016-04-10 NOTE — Progress Notes (Signed)
PT evaluation addendum Late entry for 03/27/16 Visit diagnosis:    03/27/16 1428  PT Assessment  PT Recommendation/Assessment Patient needs continued PT services  PT Problem List Decreased strength;Decreased activity tolerance;Decreased balance;Decreased mobility;Decreased cognition;Decreased safety awareness;Cardiopulmonary status limiting activity (Visit diagnosis:  unsteadiness on feet)  Barriers to Discharge Decreased caregiver support  Barriers to Discharge Comments lives alone   04/10/2016 [place], [place]

## 2016-04-18 DIAGNOSIS — E1122 Type 2 diabetes mellitus with diabetic chronic kidney disease: Secondary | ICD-10-CM | POA: Diagnosis not present

## 2016-04-18 DIAGNOSIS — I1310 Hypertensive heart and chronic kidney disease without heart failure, with stage 1 through stage 4 chronic kidney disease, or unspecified chronic kidney disease: Secondary | ICD-10-CM | POA: Diagnosis not present

## 2016-04-18 DIAGNOSIS — E46 Unspecified protein-calorie malnutrition: Secondary | ICD-10-CM | POA: Diagnosis not present

## 2016-04-18 DIAGNOSIS — F322 Major depressive disorder, single episode, severe without psychotic features: Secondary | ICD-10-CM | POA: Diagnosis not present

## 2016-04-18 DIAGNOSIS — I209 Angina pectoris, unspecified: Secondary | ICD-10-CM | POA: Diagnosis not present

## 2016-04-18 DIAGNOSIS — F0390 Unspecified dementia without behavioral disturbance: Secondary | ICD-10-CM | POA: Diagnosis not present

## 2016-04-18 DIAGNOSIS — N183 Chronic kidney disease, stage 3 (moderate): Secondary | ICD-10-CM | POA: Diagnosis not present

## 2016-04-18 DIAGNOSIS — E782 Mixed hyperlipidemia: Secondary | ICD-10-CM | POA: Diagnosis not present

## 2016-04-19 DIAGNOSIS — I480 Paroxysmal atrial fibrillation: Secondary | ICD-10-CM | POA: Diagnosis not present

## 2016-04-19 DIAGNOSIS — I5043 Acute on chronic combined systolic (congestive) and diastolic (congestive) heart failure: Secondary | ICD-10-CM | POA: Diagnosis not present

## 2016-04-19 DIAGNOSIS — E1151 Type 2 diabetes mellitus with diabetic peripheral angiopathy without gangrene: Secondary | ICD-10-CM | POA: Diagnosis not present

## 2016-04-19 DIAGNOSIS — I110 Hypertensive heart disease with heart failure: Secondary | ICD-10-CM | POA: Diagnosis not present

## 2016-04-19 DIAGNOSIS — I214 Non-ST elevation (NSTEMI) myocardial infarction: Secondary | ICD-10-CM | POA: Diagnosis not present

## 2016-04-19 DIAGNOSIS — F0390 Unspecified dementia without behavioral disturbance: Secondary | ICD-10-CM | POA: Diagnosis not present

## 2016-04-21 DIAGNOSIS — I480 Paroxysmal atrial fibrillation: Secondary | ICD-10-CM | POA: Diagnosis not present

## 2016-04-21 DIAGNOSIS — E1151 Type 2 diabetes mellitus with diabetic peripheral angiopathy without gangrene: Secondary | ICD-10-CM | POA: Diagnosis not present

## 2016-04-21 DIAGNOSIS — I5043 Acute on chronic combined systolic (congestive) and diastolic (congestive) heart failure: Secondary | ICD-10-CM | POA: Diagnosis not present

## 2016-04-21 DIAGNOSIS — I214 Non-ST elevation (NSTEMI) myocardial infarction: Secondary | ICD-10-CM | POA: Diagnosis not present

## 2016-04-21 DIAGNOSIS — F0390 Unspecified dementia without behavioral disturbance: Secondary | ICD-10-CM | POA: Diagnosis not present

## 2016-04-21 DIAGNOSIS — I110 Hypertensive heart disease with heart failure: Secondary | ICD-10-CM | POA: Diagnosis not present

## 2016-04-23 ENCOUNTER — Emergency Department (HOSPITAL_COMMUNITY): Payer: Medicare Other

## 2016-04-23 ENCOUNTER — Emergency Department (HOSPITAL_COMMUNITY)
Admission: EM | Admit: 2016-04-23 | Discharge: 2016-04-24 | Disposition: A | Discharge status: Home / Self Care | Payer: Medicare Other | Attending: Emergency Medicine | Admitting: Emergency Medicine

## 2016-04-23 ENCOUNTER — Encounter (HOSPITAL_COMMUNITY): Payer: Self-pay

## 2016-04-23 DIAGNOSIS — Z79899 Other long term (current) drug therapy: Secondary | ICD-10-CM | POA: Diagnosis not present

## 2016-04-23 DIAGNOSIS — Z951 Presence of aortocoronary bypass graft: Secondary | ICD-10-CM | POA: Diagnosis not present

## 2016-04-23 DIAGNOSIS — R0789 Other chest pain: Secondary | ICD-10-CM | POA: Diagnosis not present

## 2016-04-23 DIAGNOSIS — Z87891 Personal history of nicotine dependence: Secondary | ICD-10-CM | POA: Diagnosis not present

## 2016-04-23 DIAGNOSIS — I2510 Atherosclerotic heart disease of native coronary artery without angina pectoris: Secondary | ICD-10-CM | POA: Diagnosis not present

## 2016-04-23 DIAGNOSIS — E1151 Type 2 diabetes mellitus with diabetic peripheral angiopathy without gangrene: Secondary | ICD-10-CM | POA: Diagnosis not present

## 2016-04-23 DIAGNOSIS — R079 Chest pain, unspecified: Secondary | ICD-10-CM | POA: Diagnosis not present

## 2016-04-23 DIAGNOSIS — I252 Old myocardial infarction: Secondary | ICD-10-CM | POA: Diagnosis not present

## 2016-04-23 DIAGNOSIS — N183 Chronic kidney disease, stage 3 (moderate): Secondary | ICD-10-CM | POA: Insufficient documentation

## 2016-04-23 DIAGNOSIS — E1140 Type 2 diabetes mellitus with diabetic neuropathy, unspecified: Secondary | ICD-10-CM | POA: Insufficient documentation

## 2016-04-23 DIAGNOSIS — I5043 Acute on chronic combined systolic (congestive) and diastolic (congestive) heart failure: Secondary | ICD-10-CM | POA: Insufficient documentation

## 2016-04-23 DIAGNOSIS — E876 Hypokalemia: Secondary | ICD-10-CM | POA: Diagnosis not present

## 2016-04-23 DIAGNOSIS — Z7982 Long term (current) use of aspirin: Secondary | ICD-10-CM | POA: Diagnosis not present

## 2016-04-23 DIAGNOSIS — E1122 Type 2 diabetes mellitus with diabetic chronic kidney disease: Secondary | ICD-10-CM | POA: Diagnosis not present

## 2016-04-23 DIAGNOSIS — I130 Hypertensive heart and chronic kidney disease with heart failure and stage 1 through stage 4 chronic kidney disease, or unspecified chronic kidney disease: Secondary | ICD-10-CM | POA: Insufficient documentation

## 2016-04-23 LAB — CBC
HCT: 34.2 % — ABNORMAL LOW (ref 36.0–46.0)
Hemoglobin: 11.4 g/dL — ABNORMAL LOW (ref 12.0–15.0)
MCH: 29.4 pg (ref 26.0–34.0)
MCHC: 33.3 g/dL (ref 30.0–36.0)
MCV: 88.1 fL (ref 78.0–100.0)
PLATELETS: 314 10*3/uL (ref 150–400)
RBC: 3.88 MIL/uL (ref 3.87–5.11)
RDW: 12.7 % (ref 11.5–15.5)
WBC: 7.7 10*3/uL (ref 4.0–10.5)

## 2016-04-23 LAB — BASIC METABOLIC PANEL WITH GFR
Anion gap: 12 (ref 5–15)
BUN: 38 mg/dL — ABNORMAL HIGH (ref 6–20)
CO2: 25 mmol/L (ref 22–32)
Calcium: 9.7 mg/dL (ref 8.9–10.3)
Chloride: 101 mmol/L (ref 101–111)
Creatinine, Ser: 1.4 mg/dL — ABNORMAL HIGH (ref 0.44–1.00)
GFR calc Af Amer: 38 mL/min — ABNORMAL LOW
GFR calc non Af Amer: 33 mL/min — ABNORMAL LOW
Glucose, Bld: 156 mg/dL — ABNORMAL HIGH (ref 65–99)
Potassium: 2.9 mmol/L — ABNORMAL LOW (ref 3.5–5.1)
Sodium: 138 mmol/L (ref 135–145)

## 2016-04-23 LAB — I-STAT TROPONIN, ED: Troponin i, poc: 0.03 ng/mL (ref 0.00–0.08)

## 2016-04-23 LAB — CBG MONITORING, ED: Glucose-Capillary: 114 mg/dL — ABNORMAL HIGH (ref 65–99)

## 2016-04-23 MED ORDER — POTASSIUM CHLORIDE CRYS ER 20 MEQ PO TBCR
40.0000 meq | EXTENDED_RELEASE_TABLET | Freq: Once | ORAL | Status: AC
  Administered 2016-04-23: 40 meq via ORAL
  Filled 2016-04-23: qty 2

## 2016-04-23 MED ORDER — POTASSIUM CHLORIDE CRYS ER 20 MEQ PO TBCR: 20.0000 meq | EXTENDED_RELEASE_TABLET | Freq: Every day | ORAL | 0 refills | Status: DC

## 2016-04-23 NOTE — Discharge Instructions (Signed)
There were no signs or symptoms of heart damage or disease at this time.  Your potassium level was a little low, and should improve by increasing the amount of potassium in your diet. We will prescribe a few potassium pills, to help it get better more quickly.

## 2016-04-23 NOTE — ED Notes (Signed)
Spoke with pt son [name] and notified of pt condition and ready for discharge. He verbalized he would be to for transportation soon.

## 2016-04-23 NOTE — ED Triage Notes (Signed)
From home after having chest pain x 2 hours. Pt reports pain is left sided and radiates to left arm and back. Pt took 324mg  ASA pta. #20 RFA. NAD.

## 2016-04-23 NOTE — ED Notes (Signed)
Patient transported to X-ray 

## 2016-04-23 NOTE — ED Provider Notes (Signed)
[facility] DEPT Provider Note   CSN: [id number] Arrival date & time: 04/23/16  2029     History   Chief Complaint Chief Complaint  Patient presents with  . Chest Pain    HPI [name] is a 82 y.o. female.  She presents for evaluation of chest pain, which started today. She is a poor historian and unable to give complete history.   Level V caveat- dementia  HPI  Past Medical History:  Diagnosis Date  . Aortic valve disorder   . Chronic diastolic heart failure ([place])   . Constipation   . Coronary artery disease   . Dementia   . Diabetes mellitus without complication ([place])   . GERD (gastroesophageal reflux disease)   . Hyperlipidemia   . Hypertensive heart disease without CHF   . Peripheral neuropathy ([place])   . Peripheral vascular disease [facility])     Patient Active Problem List   Diagnosis Date Noted  . Cardiomyopathy, ischemic 03/27/2016  . CRI (chronic renal insufficiency), stage 3 (moderate) 03/27/2016  . Chest pain, moderate coronary artery risk 03/26/2016  . Acute on chronic combined systolic and diastolic CHF (congestive heart failure) ([place])   . Atrial fibrillation with rapid ventricular response ([place])   . NSTEMI- Toponin 6.11 on 03/03/16   . Fall   . Paroxysmal atrial fibrillation ([place]) 03/02/2016  . Chest pain 03/02/2016  . Type 2 diabetes mellitus with vascular disease ([place])   . Dementia   . MI (myocardial infarction)   . Hx of CABG-3 surgeries, and LM stent   . Hypertensive heart disease without CHF   . Hyperlipidemia   . Peripheral vascular disease ([place])   . Chronic diastolic heart failure ([place])   . Mild aortic stenosis   . GERD (gastroesophageal reflux disease)     Past Surgical History:  Procedure Laterality Date  . BREAST ENHANCEMENT SURGERY    . CAROTID ENDARTERECTOMY  12/2001  . [place], [id number]   By Dr. Camillo Flaming  . LAPAROSCOPIC CHOLECYSTECTOMY  2001  . Repair of left iliac psuedoaneurysm  following cath  2006  . Repair of lymphocoele    . TONSILLECTOMY      OB History    No data available       Home Medications    Prior to Admission medications   Medication Sig Start Date End Date Taking? Authorizing Provider  amLODipine (NORVASC) 5 MG tablet Take 1 tablet (5 mg total) by mouth 2 (two) times daily after a meal. 03/06/16   [place], DO  aspirin EC 81 MG EC tablet Take 1 tablet (81 mg total) by mouth daily. 03/29/16   Geradine Girt, DO  atorvastatin (LIPITOR) 20 MG tablet Take 1 tablet (20 mg total) by mouth daily at 6 PM. 03/06/16   Kerney Elbe, DO  clopidogrel (PLAVIX) 75 MG tablet Take 75 mg by mouth daily.    Historical Provider, MD  ezetimibe (ZETIA) 10 MG tablet Take 10 mg by mouth daily.    Historical Provider, MD  feeding supplement, ENSURE ENLIVE, (ENSURE ENLIVE) LIQD Take 237 mLs by mouth 2 (two) times daily between meals. 03/06/16   Kerney Elbe, DO  hydrALAZINE (APRESOLINE) 50 MG tablet Take 1 tablet (50 mg total) by mouth every 8 (eight) hours. 03/06/16   [place], DO  isosorbide mononitrate (IMDUR) 60 MG 24 hr tablet Take 3 tablets (180 mg total) by mouth daily. 03/29/16   Geradine Girt, DO  lidocaine (LIDODERM) 5 % Place 1 patch onto the skin daily. Remove & Discard patch within 12 hours or as directed by MD 03/28/16   Geradine Girt, DO  memantine (NAMENDA) 10 MG tablet Take 10 mg by mouth 2 (two) times daily.    Historical Provider, MD  metoprolol tartrate (LOPRESSOR) 25 MG tablet Take 1 tablet (25 mg total) by mouth 2 (two) times daily. 03/06/16   [place], DO  pantoprazole (PROTONIX) 20 MG tablet Take 1 tablet (20 mg total) by mouth daily. 03/29/16   Geradine Girt, DO  polyethylene glycol (MIRALAX / GLYCOLAX) packet Take 17 g by mouth daily. 03/28/16   Geradine Girt, DO    Family History Family History  Problem Relation Age of Onset  . Coronary artery disease Brother   . Coronary artery disease Brother   .  Coronary artery disease Brother     Social History Social History  Substance Use Topics  . Smoking status: Former Research scientist (life sciences)  . Smokeless tobacco: Never Used  . Alcohol use No     Allergies   Clindamycin/lincomycin   Review of Systems Review of Systems  Unable to perform ROS: Dementia     Physical Exam Updated Vital Signs BP 128/60   Pulse 85   Temp 97.9 F (36.6 C) (Oral)   Resp 16   Ht 5\' 2"  (1.575 m)   Wt 115 lb (52.2 kg)   SpO2 94%   BMI 21.03 kg/m   Physical Exam  Constitutional: She appears well-developed and well-nourished.  HENT:  Head: Normocephalic and atraumatic.  Eyes: Conjunctivae and EOM are normal. Pupils are equal, round, and reactive to light.  Neck: Normal range of motion and phonation normal. Neck supple.  Cardiovascular: Normal rate and regular rhythm.   Pulmonary/Chest: Effort normal and breath sounds normal. She exhibits tenderness (Left low anterior, mild.).  Abdominal: Soft. She exhibits no distension. There is no tenderness. There is no guarding.  Musculoskeletal: Normal range of motion.  Neurological: She is alert. She exhibits normal muscle tone.  Skin: Skin is warm and dry.  Psychiatric: She has a normal mood and affect. Her behavior is normal.  Nursing note and vitals reviewed.    ED Treatments / Results  Labs (all labs ordered are listed, but only abnormal results are displayed) Labs Reviewed  BASIC METABOLIC PANEL - Abnormal; Notable for the following:       Result Value   Potassium 2.9 (*)    Glucose, Bld 156 (*)    BUN 38 (*)    Creatinine, Ser 1.40 (*)    GFR calc non Af Amer 33 (*)    GFR calc Af Amer 38 (*)    All other components within normal limits  CBC - Abnormal; Notable for the following:    Hemoglobin 11.4 (*)    HCT 34.2 (*)    All other components within normal limits  I-STAT TROPOININ, ED    EKG  EKG Interpretation  Date/Time:  Sunday April 23 2016 20:37:09 EST Ventricular Rate:  96 PR  Interval:    QRS Duration: 97 QT Interval:  346 QTC Calculation: 438 R Axis:   22 Text Interpretation:  Sinus tachycardia Atrial premature complexes Probable left atrial enlargement LVH with secondary repolarization abnormality ST depr, consider ischemia, inferior leads since last tracing no significant change Confirmed by Eulis Foster  MD, Ardenia Stiner [phone]) on 04/23/2016 8:45:29 PM       Radiology Dg Chest 2 View  Result Date: 04/23/2016  CLINICAL DATA:  Chest pain and back pain, onset tonight. EXAM: CHEST  2 VIEW COMPARISON:  03/26/2016 FINDINGS: The lungs are clear. Pulmonary vasculature is normal. There is unchanged borderline cardiomegaly. Hilar and mediastinal contours are unremarkable and unchanged. Pulmonary vasculature is normal. No pleural effusions. IMPRESSION: No active cardiopulmonary disease. Electronically Signed   By: Andreas Newport M.D.   On: 04/23/2016 21:53    Procedures Procedures (including critical care time)  Medications Ordered in ED Medications  potassium chloride SA (K-DUR,KLOR-CON) CR tablet 40 mEq (not administered)     Initial Impression / Assessment and Plan / ED Course  I have reviewed the triage vital signs and the nursing notes.  Pertinent labs & imaging results that were available during my care of the patient were reviewed by me and considered in my medical decision making (see chart for details).  Clinical Course as of Apr 23 2320  Nancy Fetter Apr 23, 2016  2159 DG Chest 2 View [EW]  2159 Low Potassium: (!) 2.9 [EW]  2159 High Glucose: (!) 156 [EW]  2159 High Creatinine: (!) 1.40 [EW]    Clinical Course User Index [EW] Daleen Bo, MD    Medications  potassium chloride SA (K-DUR,KLOR-CON) CR tablet 40 mEq (not administered)    Patient Vitals for the past 24 hrs:  BP Temp Temp src Pulse Resp SpO2 Height Weight  04/23/16 2145 128/60 - - 85 - 94 % - -  04/23/16 2130 139/57 - - 86 - 96 % - -  04/23/16 2100 146/67 - - 96 16 97 % - -  04/23/16 2039  157/62 97.9 F (36.6 C) Oral 97 14 98 % - -  04/23/16 2036 - - - - - - 5\' 2"  (1.575 m) 115 lb (52.2 kg)    11:25 PM Reevaluation with update and discussion. After initial assessment and treatment, an updated evaluation reveals No change in clinical status. Repeat vital signs are reassuring.Daleen Bo L     Final Clinical Impressions(s) / ED Diagnoses   Final diagnoses:  Nonspecific chest pain  Hypokalemia    Nonspecific chest pain, patient recently evaluated for same, with hospital admission. Incidental hypokalemia, cause not specific. No evidence for hemodynamic instability, metabolic instability or impending vascular collapse.  Nursing Notes Reviewed/ Care Coordinated Applicable Imaging Reviewed Interpretation of Laboratory Data incorporated into ED treatment  The patient appears reasonably screened and/or stabilized for discharge and I doubt any other medical condition or other [facility] requiring further screening, evaluation, or treatment in the ED at this time prior to discharge.  Plan: Home Medications- continue; Home Treatments- rest, increase ptassium in diet; return here if the recommended treatment, does not improve the symptoms; Recommended follow up- PCP 3-5 days for check up   New Prescriptions New Prescriptions   No medications on file     Daleen Bo, MD 04/23/16 [phone]

## 2016-04-25 DIAGNOSIS — E1151 Type 2 diabetes mellitus with diabetic peripheral angiopathy without gangrene: Secondary | ICD-10-CM | POA: Diagnosis not present

## 2016-04-25 DIAGNOSIS — F0390 Unspecified dementia without behavioral disturbance: Secondary | ICD-10-CM | POA: Diagnosis not present

## 2016-04-25 DIAGNOSIS — I5043 Acute on chronic combined systolic (congestive) and diastolic (congestive) heart failure: Secondary | ICD-10-CM | POA: Diagnosis not present

## 2016-04-25 DIAGNOSIS — I214 Non-ST elevation (NSTEMI) myocardial infarction: Secondary | ICD-10-CM | POA: Diagnosis not present

## 2016-04-25 DIAGNOSIS — I110 Hypertensive heart disease with heart failure: Secondary | ICD-10-CM | POA: Diagnosis not present

## 2016-04-25 DIAGNOSIS — I480 Paroxysmal atrial fibrillation: Secondary | ICD-10-CM | POA: Diagnosis not present

## 2016-05-05 ENCOUNTER — Observation Stay (HOSPITAL_COMMUNITY)
Admission: EM | Admit: 2016-05-05 | Discharge: 2016-05-06 | Disposition: A | Discharge status: Home / Self Care | Payer: Medicare Other | Attending: Internal Medicine | Admitting: Internal Medicine

## 2016-05-05 ENCOUNTER — Emergency Department (HOSPITAL_COMMUNITY): Payer: Medicare Other

## 2016-05-05 ENCOUNTER — Encounter (HOSPITAL_COMMUNITY): Payer: Self-pay | Admitting: *Deleted

## 2016-05-05 DIAGNOSIS — R079 Chest pain, unspecified: Secondary | ICD-10-CM | POA: Diagnosis not present

## 2016-05-05 DIAGNOSIS — Z951 Presence of aortocoronary bypass graft: Secondary | ICD-10-CM | POA: Diagnosis not present

## 2016-05-05 DIAGNOSIS — I5043 Acute on chronic combined systolic (congestive) and diastolic (congestive) heart failure: Secondary | ICD-10-CM | POA: Insufficient documentation

## 2016-05-05 DIAGNOSIS — E1140 Type 2 diabetes mellitus with diabetic neuropathy, unspecified: Secondary | ICD-10-CM | POA: Insufficient documentation

## 2016-05-05 DIAGNOSIS — N183 Chronic kidney disease, stage 3 (moderate): Secondary | ICD-10-CM | POA: Insufficient documentation

## 2016-05-05 DIAGNOSIS — I200 Unstable angina: Secondary | ICD-10-CM

## 2016-05-05 DIAGNOSIS — I130 Hypertensive heart and chronic kidney disease with heart failure and stage 1 through stage 4 chronic kidney disease, or unspecified chronic kidney disease: Secondary | ICD-10-CM | POA: Diagnosis not present

## 2016-05-05 DIAGNOSIS — I252 Old myocardial infarction: Secondary | ICD-10-CM | POA: Diagnosis not present

## 2016-05-05 DIAGNOSIS — Z7902 Long term (current) use of antithrombotics/antiplatelets: Secondary | ICD-10-CM | POA: Diagnosis not present

## 2016-05-05 DIAGNOSIS — Z7982 Long term (current) use of aspirin: Secondary | ICD-10-CM | POA: Insufficient documentation

## 2016-05-05 DIAGNOSIS — Z87891 Personal history of nicotine dependence: Secondary | ICD-10-CM | POA: Diagnosis not present

## 2016-05-05 DIAGNOSIS — F0150 Vascular dementia without behavioral disturbance: Secondary | ICD-10-CM

## 2016-05-05 DIAGNOSIS — R0789 Other chest pain: Secondary | ICD-10-CM | POA: Diagnosis not present

## 2016-05-05 DIAGNOSIS — I119 Hypertensive heart disease without heart failure: Secondary | ICD-10-CM | POA: Diagnosis not present

## 2016-05-05 DIAGNOSIS — I209 Angina pectoris, unspecified: Principal | ICD-10-CM | POA: Insufficient documentation

## 2016-05-05 DIAGNOSIS — Z79899 Other long term (current) drug therapy: Secondary | ICD-10-CM | POA: Insufficient documentation

## 2016-05-05 DIAGNOSIS — I214 Non-ST elevation (NSTEMI) myocardial infarction: Secondary | ICD-10-CM | POA: Diagnosis present

## 2016-05-05 DIAGNOSIS — F0390 Unspecified dementia without behavioral disturbance: Secondary | ICD-10-CM | POA: Diagnosis present

## 2016-05-05 LAB — CBC WITH DIFFERENTIAL/PLATELET
BASOS ABS: 0 10*3/uL (ref 0.0–0.1)
BASOS PCT: 0 %
Eosinophils Absolute: 0.2 10*3/uL (ref 0.0–0.7)
Eosinophils Relative: 3 %
HEMATOCRIT: 39.1 % (ref 36.0–46.0)
HEMOGLOBIN: 13.2 g/dL (ref 12.0–15.0)
LYMPHS PCT: 28 %
Lymphs Abs: 2.2 10*3/uL (ref 0.7–4.0)
MCH: 29.2 pg (ref 26.0–34.0)
MCHC: 33.8 g/dL (ref 30.0–36.0)
MCV: 86.5 fL (ref 78.0–100.0)
MONO ABS: 0.8 10*3/uL (ref 0.1–1.0)
Monocytes Relative: 10 %
NEUTROS ABS: 4.6 10*3/uL (ref 1.7–7.7)
NEUTROS PCT: 59 %
Platelets: 294 10*3/uL (ref 150–400)
RBC: 4.52 MIL/uL (ref 3.87–5.11)
RDW: 12.3 % (ref 11.5–15.5)
WBC: 7.8 10*3/uL (ref 4.0–10.5)

## 2016-05-05 LAB — TROPONIN I
TROPONIN I: 0.03 ng/mL — AB (ref <0.03)
Troponin I: 0.03 ng/mL (ref <0.03)
Troponin I: 0.03 ng/mL (ref <0.03)

## 2016-05-05 LAB — BASIC METABOLIC PANEL
ANION GAP: 10 (ref 5–15)
BUN: 20 mg/dL (ref 6–20)
CALCIUM: 9.6 mg/dL (ref 8.9–10.3)
CHLORIDE: 99 mmol/L — AB (ref 101–111)
CO2: 25 mmol/L (ref 22–32)
Creatinine, Ser: 1.41 mg/dL — ABNORMAL HIGH (ref 0.44–1.00)
GFR calc non Af Amer: 32 mL/min — ABNORMAL LOW (ref >60)
GFR, EST AFRICAN AMERICAN: 38 mL/min — AB (ref >60)
GLUCOSE: 118 mg/dL — AB (ref 65–99)
POTASSIUM: 2.8 mmol/L — AB (ref 3.5–5.1)
Sodium: 134 mmol/L — ABNORMAL LOW (ref 135–145)

## 2016-05-05 LAB — I-STAT TROPONIN, ED: Troponin i, poc: 0.04 ng/mL (ref 0.00–0.08)

## 2016-05-05 MED ORDER — AMLODIPINE BESYLATE 5 MG PO TABS
5.0000 mg | ORAL_TABLET | Freq: Two times a day (BID) | ORAL | Status: DC
  Administered 2016-05-05 – 2016-05-06 (×3): 5 mg via ORAL
  Filled 2016-05-05 (×3): qty 1

## 2016-05-05 MED ORDER — ONDANSETRON HCL 4 MG/2ML IJ SOLN: 4.0000 mg | Freq: Four times a day (QID) | INTRAMUSCULAR | Status: DC | PRN

## 2016-05-05 MED ORDER — POTASSIUM CHLORIDE 20 MEQ/15ML (10%) PO SOLN
40.0000 meq | Freq: Once | ORAL | Status: AC
  Administered 2016-05-05: 40 meq via ORAL
  Filled 2016-05-05: qty 30

## 2016-05-05 MED ORDER — POTASSIUM CHLORIDE CRYS ER 20 MEQ PO TBCR
20.0000 meq | EXTENDED_RELEASE_TABLET | Freq: Every day | ORAL | Status: DC
  Administered 2016-05-05 – 2016-05-06 (×2): 20 meq via ORAL
  Filled 2016-05-05 (×2): qty 1

## 2016-05-05 MED ORDER — MORPHINE SULFATE (PF) 4 MG/ML IV SOLN
2.0000 mg | INTRAVENOUS | Status: DC | PRN
  Administered 2016-05-05: 2 mg via INTRAVENOUS
  Filled 2016-05-05: qty 1

## 2016-05-05 MED ORDER — NITROGLYCERIN 0.4 MG SL SUBL: 0.4000 mg | SUBLINGUAL_TABLET | SUBLINGUAL | Status: DC | PRN

## 2016-05-05 MED ORDER — POTASSIUM CHLORIDE CRYS ER 20 MEQ PO TBCR
30.0000 meq | EXTENDED_RELEASE_TABLET | Freq: Once | ORAL | Status: AC
  Administered 2016-05-05: 09:00:00 30 meq via ORAL
  Filled 2016-05-05: qty 1

## 2016-05-05 MED ORDER — PANTOPRAZOLE SODIUM 20 MG PO TBEC
20.0000 mg | DELAYED_RELEASE_TABLET | Freq: Every day | ORAL | Status: DC
  Administered 2016-05-05 – 2016-05-06 (×2): 20 mg via ORAL
  Filled 2016-05-05 (×4): qty 1

## 2016-05-05 MED ORDER — HYDRALAZINE HCL 50 MG PO TABS
50.0000 mg | ORAL_TABLET | Freq: Three times a day (TID) | ORAL | Status: DC
  Administered 2016-05-05 – 2016-05-06 (×5): 50 mg via ORAL
  Filled 2016-05-05 (×3): qty 1
  Filled 2016-05-05: qty 2
  Filled 2016-05-05 (×2): qty 1

## 2016-05-05 MED ORDER — NITROGLYCERIN 2 % TD OINT
1.0000 [in_us] | TOPICAL_OINTMENT | Freq: Four times a day (QID) | TRANSDERMAL | Status: DC
  Administered 2016-05-05: 1 [in_us] via TOPICAL
  Filled 2016-05-05: qty 1

## 2016-05-05 MED ORDER — ASPIRIN EC 81 MG PO TBEC
81.0000 mg | DELAYED_RELEASE_TABLET | Freq: Every day | ORAL | Status: DC
  Administered 2016-05-05 – 2016-05-06 (×2): 81 mg via ORAL
  Filled 2016-05-05 (×2): qty 1

## 2016-05-05 MED ORDER — POLYETHYLENE GLYCOL 3350 17 G PO PACK
17.0000 g | PACK | Freq: Every day | ORAL | Status: DC
  Administered 2016-05-05 – 2016-05-06 (×2): 17 g via ORAL
  Filled 2016-05-05 (×2): qty 1

## 2016-05-05 MED ORDER — POTASSIUM CHLORIDE CRYS ER 20 MEQ PO TBCR: 40.0000 meq | EXTENDED_RELEASE_TABLET | Freq: Once | ORAL | Status: DC

## 2016-05-05 MED ORDER — METOPROLOL TARTRATE 25 MG PO TABS
25.0000 mg | ORAL_TABLET | Freq: Two times a day (BID) | ORAL | Status: DC
Start: 2016-05-05 — End: 2016-05-06
  Administered 2016-05-05 – 2016-05-06 (×3): 25 mg via ORAL
  Filled 2016-05-05 (×3): qty 1

## 2016-05-05 MED ORDER — ISOSORBIDE MONONITRATE ER 60 MG PO TB24
180.0000 mg | ORAL_TABLET | Freq: Every day | ORAL | Status: DC
Start: 2016-05-05 — End: 2016-05-06
  Administered 2016-05-05 – 2016-05-06 (×2): 180 mg via ORAL
  Filled 2016-05-05 (×2): qty 3

## 2016-05-05 MED ORDER — ENSURE ENLIVE PO LIQD: 237.0000 mL | Freq: Two times a day (BID) | ORAL | Status: DC

## 2016-05-05 MED ORDER — EZETIMIBE 10 MG PO TABS
10.0000 mg | ORAL_TABLET | Freq: Every day | ORAL | Status: DC
Start: 2016-05-05 — End: 2016-05-06
  Administered 2016-05-05 – 2016-05-06 (×2): 10 mg via ORAL
  Filled 2016-05-05 (×2): qty 1

## 2016-05-05 MED ORDER — ACETAMINOPHEN 325 MG PO TABS: 650.0000 mg | ORAL_TABLET | ORAL | Status: DC | PRN

## 2016-05-05 MED ORDER — ATORVASTATIN CALCIUM 20 MG PO TABS
20.0000 mg | ORAL_TABLET | Freq: Every day | ORAL | Status: DC
Start: 2016-05-05 — End: 2016-05-06
  Administered 2016-05-05: 20 mg via ORAL
  Filled 2016-05-05: qty 1

## 2016-05-05 MED ORDER — CLOPIDOGREL BISULFATE 75 MG PO TABS
75.0000 mg | ORAL_TABLET | Freq: Every day | ORAL | Status: DC
  Administered 2016-05-05 – 2016-05-06 (×2): 75 mg via ORAL
  Filled 2016-05-05 (×2): qty 1

## 2016-05-05 MED ORDER — MEMANTINE HCL 5 MG PO TABS
10.0000 mg | ORAL_TABLET | Freq: Two times a day (BID) | ORAL | Status: DC
  Administered 2016-05-05 – 2016-05-06 (×3): 10 mg via ORAL
  Filled 2016-05-05 (×3): qty 2

## 2016-05-05 MED ORDER — ENOXAPARIN SODIUM 40 MG/0.4ML ~~LOC~~ SOLN
40.0000 mg | SUBCUTANEOUS | Status: DC
  Administered 2016-05-06: 40 mg via SUBCUTANEOUS
  Filled 2016-05-05: qty 0.4

## 2016-05-05 NOTE — ED Notes (Signed)
IV attempted without success; labs obtained

## 2016-05-05 NOTE — Progress Notes (Signed)
Patient seen and examined this morning, admitted overnight by Dr. Alcario Drought, agree with H&P and A/P  HPI 82 y.o. female with medical history significant of CAD s/p CABG.  Had NSTEMI back in October, cards wanted to manage medically.  Recurrent CP admit in Nov, cards still wanted to manage medically. Patient presents to the ED with recurrent chest pain again.  Pain in central chest, radiating to back.  Is persistent but mild in the ED.  ASA 324 en route helped the pain.  Hasnt gotten NTG yet.   Chest pain  - cardiology consulted, appreciate input, not a candidate for aggressive interventions, recommending palliative approach  Dementia  - with paranoia  HTN - continue home medications  Goals of care - consulted palliative care   Costin M. Cruzita Lederer, MD [facility] [phone]

## 2016-05-05 NOTE — ED Notes (Signed)
Spoke with IV team RN who will meet pt on 3W for IV placement. After speaking with Kathlen Brunswick RN, nitro paste applied, hydralazine held at this time

## 2016-05-05 NOTE — ED Provider Notes (Signed)
[facility] DEPT Provider Note   CSN: [id number] Arrival date & time: 05/05/16  0143  By signing my name below, I, [name], attest that this documentation has been prepared under the direction and in the presence of Ariel Greek, MD . Electronically Signed: Higinio [name], Scribe. 05/05/2016. 2:05 AM.  History   Chief Complaint Chief Complaint  Patient presents with  . Chest Pain   The history is provided by the patient. No language interpreter was used.   LEVEL V CAVEAT and Limited ROS due to Dementia   HPI Comments: [name] is a 82 y.o. female with PMHx of aortic valve disorder, CAD, DM and GERD, brought in by EMS to the Emergency Department for an evaluation of left sided chest pain that began this evening. Per EMS, pt was complaining of chest pain radiating in her back. She was given 324 mg ASA en route to the ED with improvement of her pain.   Past Medical History:  Diagnosis Date  . Aortic valve disorder   . Chronic diastolic heart failure ([place])   . Constipation   . Coronary artery disease   . Dementia   . Diabetes mellitus without complication ([place])   . GERD (gastroesophageal reflux disease)   . Hyperlipidemia   . Hypertensive heart disease without CHF   . Peripheral neuropathy ([place])   . Peripheral vascular disease [facility])     Patient Active Problem List   Diagnosis Date Noted  . Cardiomyopathy, ischemic 03/27/2016  . CRI (chronic renal insufficiency), stage 3 (moderate) 03/27/2016  . Chest pain, moderate coronary artery risk 03/26/2016  . Acute on chronic combined systolic and diastolic CHF (congestive heart failure) ([place])   . Atrial fibrillation with rapid ventricular response ([place])   . NSTEMI- Toponin 6.11 on 03/03/16   . Fall   . Paroxysmal atrial fibrillation ([place]) 03/02/2016  . Chest pain 03/02/2016  . Type 2 diabetes mellitus with vascular disease ([place])   . Dementia   . MI (myocardial infarction)   . Hx of CABG-3 surgeries, and LM  stent   . Hypertensive heart disease without CHF   . Hyperlipidemia   . Peripheral vascular disease ([place])   . Chronic diastolic heart failure ([place])   . Mild aortic stenosis   . GERD (gastroesophageal reflux disease)     Past Surgical History:  Procedure Laterality Date  . BREAST ENHANCEMENT SURGERY    . CAROTID ENDARTERECTOMY  12/2001  . [place], [id number]   By Dr. Camillo Flaming  . LAPAROSCOPIC CHOLECYSTECTOMY  2001  . Repair of left iliac psuedoaneurysm following cath  2006  . Repair of lymphocoele    . TONSILLECTOMY      OB History    No data available       Home Medications    Prior to Admission medications   Medication Sig Start Date End Date Taking? Authorizing Provider  amLODipine (NORVASC) 5 MG tablet Take 1 tablet (5 mg total) by mouth 2 (two) times daily after a meal. 03/06/16   [place], DO  aspirin EC 81 MG EC tablet Take 1 tablet (81 mg total) by mouth daily. 03/29/16   Geradine Girt, DO  atorvastatin (LIPITOR) 20 MG tablet Take 1 tablet (20 mg total) by mouth daily at 6 PM. 03/06/16   Kerney Elbe, DO  clopidogrel (PLAVIX) 75 MG tablet Take 75 mg by mouth daily.    Historical Provider, MD  ezetimibe (ZETIA) 10 MG tablet Take  10 mg by mouth daily.    Historical Provider, MD  feeding supplement, ENSURE ENLIVE, (ENSURE ENLIVE) LIQD Take 237 mLs by mouth 2 (two) times daily between meals. 03/06/16   Kerney Elbe, DO  hydrALAZINE (APRESOLINE) 50 MG tablet Take 1 tablet (50 mg total) by mouth every 8 (eight) hours. 03/06/16   [place], DO  isosorbide mononitrate (IMDUR) 60 MG 24 hr tablet Take 3 tablets (180 mg total) by mouth daily. 03/29/16   Geradine Girt, DO  lidocaine (LIDODERM) 5 % Place 1 patch onto the skin daily. Remove & Discard patch within 12 hours or as directed by MD 03/28/16   Geradine Girt, DO  memantine (NAMENDA) 10 MG tablet Take 10 mg by mouth 2 (two) times daily.    Historical Provider, MD    metoprolol tartrate (LOPRESSOR) 25 MG tablet Take 1 tablet (25 mg total) by mouth 2 (two) times daily. 03/06/16   [place], DO  pantoprazole (PROTONIX) 20 MG tablet Take 1 tablet (20 mg total) by mouth daily. 03/29/16   Geradine Girt, DO  polyethylene glycol (MIRALAX / GLYCOLAX) packet Take 17 g by mouth daily. 03/28/16   Geradine Girt, DO  potassium chloride SA (K-DUR,KLOR-CON) 20 MEQ tablet Take 1 tablet (20 mEq total) by mouth daily. 04/23/16   Daleen Bo, MD    Family History Family History  Problem Relation Age of Onset  . Coronary artery disease Brother   . Coronary artery disease Brother   . Coronary artery disease Brother     Social History Social History  Substance Use Topics  . Smoking status: Former Research scientist (life sciences)  . Smokeless tobacco: Never Used  . Alcohol use No     Allergies   Clindamycin/lincomycin   Review of Systems Review of Systems  Unable to perform ROS: Dementia   Physical Exam Updated Vital Signs BP 175/65   Pulse 86   Temp 97.9 F (36.6 C)   Resp 16   SpO2 98%   Physical Exam  Constitutional: She appears well-developed and well-nourished. No distress.  HENT:  Head: Normocephalic and atraumatic.  Right Ear: Hearing normal.  Left Ear: Hearing normal.  Nose: Nose normal.  Mouth/Throat: Oropharynx is clear and moist and mucous membranes are normal.  Eyes: Conjunctivae and EOM are normal. Pupils are equal, round, and reactive to light.  Neck: Normal range of motion. Neck supple.  Cardiovascular: S1 normal and S2 normal.  Exam reveals no gallop and no friction rub.   No murmur heard. Extrasystoles present   Pulmonary/Chest: Effort normal and breath sounds normal. No respiratory distress. She exhibits no tenderness.  Abdominal: Soft. Normal appearance and bowel sounds are normal. There is no hepatosplenomegaly. There is no tenderness. There is no rebound, no guarding, no tenderness at McBurney's point and negative Murphy's sign. No hernia.   Musculoskeletal: Normal range of motion.  Neurological: She has normal strength. No cranial nerve deficit or sensory deficit. Coordination normal. GCS eye subscore is 4. GCS verbal subscore is 5. GCS motor subscore is 6.  Disoriented  Skin: Skin is warm, dry and intact. No rash noted. No cyanosis.  Psychiatric: She has a normal mood and affect. Her speech is normal and behavior is normal. Thought content normal.  Nursing note and vitals reviewed.  ED Treatments / Results  Labs (all labs ordered are listed, but only abnormal results are displayed) Labs Reviewed  BASIC METABOLIC PANEL - Abnormal; Notable for the following:  Result Value   Sodium 134 (*)    Potassium 2.8 (*)    Chloride 99 (*)    Glucose, Bld 118 (*)    Creatinine, Ser 1.41 (*)    GFR calc non Af Amer 32 (*)    GFR calc Af Amer 38 (*)    All other components within normal limits  CBC WITH DIFFERENTIAL/PLATELET  Randolm Idol, ED    EKG  EKG Interpretation  Date/Time:  Friday May 05 2016 01:50:31 EST Ventricular Rate:  86 PR Interval:    QRS Duration: 100 QT Interval:  411 QTC Calculation: 492 R Axis:   10 Text Interpretation:  Sinus rhythm Atrial premature complexes Biatrial enlargement LVH with secondary repolarization abnormality Borderline prolonged QT interval No significant change since last tracing Confirmed by POLLINA  MD, CHRISTOPHER [id number]) on 05/05/2016 1:52:48 AM       Radiology Dg Chest 2 View  Result Date: 05/05/2016 CLINICAL DATA:  82 year old female with chest pain and shortness of breath EXAM: CHEST  2 VIEW COMPARISON:  Chest radiograph dated 04/23/2016 FINDINGS: The lungs are clear. There is no pleural effusion or pneumothorax. The cardiac silhouette is within normal limits. Median sternotomy wires and CABG vascular clips noted. There is atherosclerotic calcification of the aortic arch. No acute osseous pathology identified. Bilateral breast implants noted. IMPRESSION: No acute  cardiopulmonary process. Electronically Signed   By: Anner Crete M.D.   On: 05/05/2016 02:35    Procedures Procedures (including critical care time)  Medications Ordered in ED Medications  nitroGLYCERIN (NITROSTAT) SL tablet 0.4 mg (not administered)    DIAGNOSTIC STUDIES:  Oxygen Saturation is 98% on RA, normal by my interpretation.    COORDINATION OF CARE:  2:00 AM Discussed treatment [name] with pt at bedside and pt agreed to [name].  Initial Impression / Assessment and [name] / ED Course  I have reviewed the triage vital signs and the nursing notes.  Pertinent labs & imaging results that were available during my care of the patient were reviewed by me and considered in my medical decision making (see chart for details).  Clinical Course    Presents to the emergency department for evaluation of chest pain. Patient is a poor historian secondary to dementia. She was given aspirin prior to arrival. Patient still having left-sided pain upon arrival. She is moderately hypertensive. Patient does have multiple cardiac risk factors, and recently had an NSTEMI. Current Heart score is 5. Patient will require inpatient monitoring for cardiac rule out.  CHA2DS2/VAS Stroke Risk Points      6 >= 2 Points: High Risk  1 - 1.99 Points: Medium Risk  0 Points: Low Risk    The patient's score has not changed in the past year.:  No Change         Details    Note: External data might be a factor in metrics not marked with    Points Metrics   This score determines the patient's risk of having a stroke if the  patient has atrial fibrillation.       1 Has Congestive Heart Failure:  Yes   1 Has Vascular Disease:  Yes   0 Has Hypertension:  No   2 Age:  48   1 Has Diabetes:  Yes   0 Had Stroke:  No Had TIA:  No Had thromboembolism:  No   1 Female:  Yes          Final Clinical Impressions(s) / ED Diagnoses   Final  diagnoses:  Chest pain, unspecified type    New Prescriptions New  Prescriptions   No medications on file   I personally performed the services described in this documentation, which was scribed in my presence. The recorded information has been reviewed and is accurate.     Ariel Greek, MD 05/05/16 [phone]

## 2016-05-05 NOTE — Consult Note (Signed)
CARDIOLOGY CONSULT NOTE   Patient ID: AROURA APPLEYARD MRN: [id number] DOB/AGE: [birth year] 80 y.o.  Admit date: 05/05/2016  Primary Physician   Mayra Neer, MD Primary Cardiologist   Dr. Wynonia Lawman  Reason for Consultation   Chest pain Requesting Physician  Dr. Cruzita Lederer  HPI: [name] is a 82 y.o. female followed by Dr. Wynonia Lawman with a history of CAD s/p CABG and PCI, PAF, CKD-stage III, HTN, PVD, aortic valve disorder, dementia and two admission for chest pain in past 3 months who again admitted for chest pain.   History of multiple prior CABG operation and a LM PCI in 2003. Last cath in 2007 showed a patent LIMA-CFX, RIMA-LAD and patent LM PCI site.   She was admitted 03/02/16-03/06/16 with back pain and ruled in for a NSTEMI-pk Troponin 6.11. Her echo showed an EF of 45-50%. Dr Wynonia Lawman felt it would be best to treat her conservatively. He did not feel she was a candidate for anticoagulation, CHADs VASc=6. She was discharged to SNF.  She was again admitted 03/26/16-03/28/16 from SNF for chest pain. [facility] seen the patient as Dr. Wynonia Lawman was on medical leave. Peak of troponin was 0.1. Patient was demented. Increased Imdur to 180mg  qd. No further invasive testing is indicated as suggested by Dr. Thurman Coyer last note.  The patient brought by EMS for left sided chest pain. She also had back pain. ? With radiation. Pain improved with ASA 324mg  and resolved after IV morphine in ER. She is demented at baseline. Stated that she lives by her self. Son lives nearby. Son remindes her to take her medications. However pt states that she is not taking " aspirin and plavix".  Currently chest pain free on nitro paste. She is unable to provide any specific information due to dementia. repeatedly states "have left sided chest pain".   EKG showed sinus rhythm with PAC, LVH and prolonged QT.  Troponin I of 0.03 x2. Scr of 1.4. K of 2.8. CXR without acute cardiopulmonary disease.    Past Medical History:    Diagnosis Date  . Aortic valve disorder   . Chronic diastolic heart failure ([place])   . Constipation   . Coronary artery disease   . Dementia   . Diabetes mellitus without complication ([place])   . GERD (gastroesophageal reflux disease)   . Hyperlipidemia   . Hypertensive heart disease without CHF   . Peripheral neuropathy ([place])   . Peripheral vascular disease [facility])      Past Surgical History:  Procedure Laterality Date  . BREAST ENHANCEMENT SURGERY    . CAROTID ENDARTERECTOMY  12/2001  . [place], [id number]   By Dr. Camillo Flaming  . LAPAROSCOPIC CHOLECYSTECTOMY  2001  . Repair of left iliac psuedoaneurysm following cath  2006  . Repair of lymphocoele    . TONSILLECTOMY      Allergies  Allergen Reactions  . Clindamycin/Lincomycin Rash and Other (See Comments)    Heart problems     I have reviewed the patient's current medications . amLODipine  5 mg Oral BID PC  . aspirin EC  81 mg Oral Daily  . atorvastatin  20 mg Oral q1800  . clopidogrel  75 mg Oral Daily  . enoxaparin (LOVENOX) injection  40 mg Subcutaneous Q24H  . ezetimibe  10 mg Oral Daily  . feeding supplement (ENSURE ENLIVE)  237 mL Oral BID BM  . hydrALAZINE  50 mg Oral Q8H  . isosorbide mononitrate  180 mg Oral Daily  .  memantine  10 mg Oral BID  . metoprolol tartrate  25 mg Oral BID  . nitroGLYCERIN  1 inch Topical Q6H  . pantoprazole  20 mg Oral Daily  . polyethylene glycol  17 g Oral Daily  . potassium chloride  30 mEq Oral Once  . potassium chloride SA  20 mEq Oral Daily    acetaminophen, morphine injection, nitroGLYCERIN, ondansetron (ZOFRAN) IV  Prior to Admission medications   Medication Sig Start Date End Date Taking? Authorizing Provider  amLODipine (NORVASC) 5 MG tablet Take 1 tablet (5 mg total) by mouth 2 (two) times daily after a meal. 03/06/16   [place], DO  aspirin EC 81 MG EC tablet Take 1 tablet (81 mg total) by mouth daily. 03/29/16   Geradine Girt,  DO  atorvastatin (LIPITOR) 20 MG tablet Take 1 tablet (20 mg total) by mouth daily at 6 PM. 03/06/16   Kerney Elbe, DO  clopidogrel (PLAVIX) 75 MG tablet Take 75 mg by mouth daily.    Historical Provider, MD  ezetimibe (ZETIA) 10 MG tablet Take 10 mg by mouth daily.    Historical Provider, MD  feeding supplement, ENSURE ENLIVE, (ENSURE ENLIVE) LIQD Take 237 mLs by mouth 2 (two) times daily between meals. 03/06/16   Kerney Elbe, DO  hydrALAZINE (APRESOLINE) 50 MG tablet Take 1 tablet (50 mg total) by mouth every 8 (eight) hours. 03/06/16   [place], DO  isosorbide mononitrate (IMDUR) 60 MG 24 hr tablet Take 3 tablets (180 mg total) by mouth daily. 03/29/16   Geradine Girt, DO  lidocaine (LIDODERM) 5 % Place 1 patch onto the skin daily. Remove & Discard patch within 12 hours or as directed by MD 03/28/16   Geradine Girt, DO  memantine (NAMENDA) 10 MG tablet Take 10 mg by mouth 2 (two) times daily.    Historical Provider, MD  metoprolol tartrate (LOPRESSOR) 25 MG tablet Take 1 tablet (25 mg total) by mouth 2 (two) times daily. 03/06/16   [place], DO  pantoprazole (PROTONIX) 20 MG tablet Take 1 tablet (20 mg total) by mouth daily. 03/29/16   Geradine Girt, DO  polyethylene glycol (MIRALAX / GLYCOLAX) packet Take 17 g by mouth daily. 03/28/16   Geradine Girt, DO  potassium chloride SA (K-DUR,KLOR-CON) 20 MEQ tablet Take 1 tablet (20 mEq total) by mouth daily. 04/23/16   Daleen Bo, MD     Social History   Social History  . Marital status: Widowed    Spouse name: N/A  . Number of children: N/A  . Years of education: N/A   Occupational History  . Not on file.   Social History Main Topics  . Smoking status: Former Research scientist (life sciences)  . Smokeless tobacco: Never Used  . Alcohol use No  . Drug use: No  . Sexual activity: No   Other Topics Concern  . 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 Father Deceased   pneumonia  . Mother Deceased at age 103   died following surgery  . Brother Deceased   died of CAD  . Brother Deceased   died of CAD  . Brother Deceased   died of CAD  . Sister Deceased   died of blood clots   Family History  Problem Relation Age of Onset  . Coronary artery disease Brother   .  Coronary artery disease Brother   . Coronary artery disease Brother     ROS:  Full 14 point review of systems complete and found to be negative unless listed above.  Physical Exam: Blood pressure (!) 146/58, pulse 79, temperature 98.5 F (36.9 C), temperature source Oral, resp. rate 16, SpO2 97 %.  General: Well developed, well nourished, female in no acute distress Head: Eyes PERRLA, No xanthomas. Normocephalic and atraumatic, oropharynx without edema or exudate.  Lungs: Resp regular and unlabored, CTA. Heart: RRR no s3, s4, or murmurs..   Neck: No carotid bruits. No lymphadenopathy.  JVD. Abdomen: Bowel sounds present, abdomen soft and non-tender without masses or hernias noted. Msk:  No spine or cva tenderness. No weakness, no joint deformities or effusions. Extremities: No clubbing, cyanosis or edema. DP/PT/Radials 2+ and equal bilaterally. Neuro: only alert to name. No focal deficits noted. Psych:  Demented Skin: No rashes or lesions noted.  Labs:   Lab Results  Component Value Date   WBC 7.8 05/05/2016   HGB 13.2 05/05/2016   HCT 39.1 05/05/2016   MCV 86.5 05/05/2016   PLT 294 05/05/2016   No results for input(s): INR in the last 72 hours.  Recent Labs Lab 05/05/16 0251  NA 134*  K 2.8*  CL 99*  CO2 25  BUN 20  CREATININE 1.41*  CALCIUM 9.6  GLUCOSE 118*   Magnesium  Date Value Ref Range Status  03/04/2016 2.0 1.7 - 2.4 mg/dL Final    Recent Labs  05/05/16 0251 05/05/16 0650  TROPONINI 0.03* 0.03*    Recent Labs  05/05/16 0256  TROPIPOC 0.04   Pro B Natriuretic peptide (BNP)  Date/Time Value Ref Range  Status  05/24/2010 03:39 PM 510.0 (H) 0.0 - 100.0 pg/mL Final  01/06/2009 01:41 AM 263.0 (H) 0.0 - 100.0 pg/mL Final   No results found for: CHOL, HDL, LDLCALC, TRIG No results found for: DDIMER Lipase  Date/Time Value Ref Range Status  01/31/2016 10:47 AM 32 11 - 51 U/L Final   TSH  Date/Time Value Ref Range Status  03/02/2016 10:00 AM 2.162 0.350 - 4.500 uIU/mL Final    Comment:    Performed by a 3rd Generation assay with a functional sensitivity of <=0.01 uIU/mL.  05/23/2012 10:00 AM 0.903 0.350 - 4.500 uIU/mL Final    Echo: 03/03/16 LV EF: 45% -   50%  ------------------------------------------------------------------- Indications:      Chest pain 786.51.  ------------------------------------------------------------------- History:   PMH:   Coronary artery disease.  Aortic valve disease. Risk factors:  Dementia. Former tobacco use. Hypertension. Diabetes mellitus.  ------------------------------------------------------------------- Study Conclusions  - Left ventricle: The cavity size was normal. There was moderate   concentric hypertrophy. Systolic function was mildly reduced. The   estimated ejection fraction was in the range of 45% to 50%.   Regional wall motion abnormalities cannot be excluded. - Aortic valve: Transvalvular velocity was minimally increased.   There was very mild stenosis. There was mild regurgitation. Valve   area (VTI): 1.39 cm^2. Valve area (Vmax): 1.22 cm^2. Valve area   (Vmean): 1.17 cm^2. - Left atrium: The atrium was mildly dilated    Radiology:  Dg Chest 2 View  Result Date: 05/05/2016 CLINICAL DATA:  82 year old female with chest pain and shortness of breath EXAM: CHEST  2 VIEW COMPARISON:  Chest radiograph dated 04/23/2016 FINDINGS: The lungs are clear. There is no pleural effusion or pneumothorax. The cardiac silhouette is within normal limits. Median sternotomy wires and CABG vascular clips noted.  There is atherosclerotic  calcification of the aortic arch. No acute osseous pathology identified. Bilateral breast implants noted. IMPRESSION: No acute cardiopulmonary process. Electronically Signed   By: Anner Crete M.D.   On: 05/05/2016 02:35    ASSESSMENT AND PLAN:    1. Unstable angina - Troponin 0.03x2 with flat trend. EKG without acute changes. Per Dr. Thurman Coyer note no further invasive testing. She is demented. Treat with medications. Consider increasing imdur further to 240mg  qd and discontinuation of nitro paste. She will  Benefits from inpatient palliative care evaluation. She will need help with medication management. Continue current medications.   2.  Paroxysmal atrial fibrillation  - Maintaing sinus rhythm. Not on anticoagulation due to dementia. CHADSVASCs score of 6.  3. ICM - she is euvolemic. EF of 45 to 50% on echo 02/2016. Continue current medications.   4. Mild AS and AR on echo  5. HTN - Stable on current medications  Signed: Bhagat,Bhavinkumar, PA 05/05/2016, 8:21 AM Pager [phone]  Co-Sign MD  Patient seen, examined. Available data reviewed. Agree with findings, assessment, and plan as outlined by Robbie Lis, PA-C. The patient is independently interviewed and examined. At the time of my interview there is no family at the bedside. The patient states that her "heart hurts." She seems very comfortable. She denies shortness of breath. History is very limited because of her dementia. On exam, she is a frail, elderly woman in no distress. Lungs are clear, heart is regular rate and rhythm without murmur or gallop, abdomen is soft, thin, nontender, extremities are without edema.  Troponins are cycled and have all been negative. I have reviewed past records. She has been managed medically even in the setting of non-STEMI because of advanced dementia. At the time of this evaluation, her cardiac markers fortunately are negative. I have reviewed her medical program which includes an aggressive  antianginal regimen. Would not make any changes to her regimen. I think a palliative medicine consultation is appropriate. Please call if we can be of any further assistance. This patient would not be a candidate for invasive cardiac evaluation really under any circumstances in my opinion. This has also been documented by her primary cardiologist, Dr. Wynonia Lawman in the past.  Sherren Mocha, M.D. 05/05/2016 11:19 AM

## 2016-05-05 NOTE — H&P (Signed)
History and Physical    [name] [id number] DOB: 02/24/1927 DOA: 05/05/2016   PCP: Mayra Neer, MD Chief Complaint:  Chief Complaint  Patient presents with  . Chest Pain    HPI: [name] is a 82 y.o. female with medical history significant of CAD s/p CABG.  Had NSTEMI back in October, cards wanted to manage medically.  Recurrent CP admit in Nov, cards still wanted to manage medically.  Patient presents to the ED with recurrent chest pain again.  Pain in central chest, radiating to back.  Is persistent but mild in the ED.  ASA 324 en route helped the pain.  Hasnt gotten NTG yet.  ED Course: The EDP requests admission for CP.  Review of Systems: As per HPI otherwise 10 point review of systems negative.    Past Medical History:  Diagnosis Date  . Aortic valve disorder   . Chronic diastolic heart failure ([place])   . Constipation   . Coronary artery disease   . Dementia   . Diabetes mellitus without complication ([place])   . GERD (gastroesophageal reflux disease)   . Hyperlipidemia   . Hypertensive heart disease without CHF   . Peripheral neuropathy ([place])   . Peripheral vascular disease [facility])     Past Surgical History:  Procedure Laterality Date  . BREAST ENHANCEMENT SURGERY    . CAROTID ENDARTERECTOMY  12/2001  . [place], [id number]   By Dr. Camillo Flaming  . LAPAROSCOPIC CHOLECYSTECTOMY  2001  . Repair of left iliac psuedoaneurysm following cath  2006  . Repair of lymphocoele    . TONSILLECTOMY       reports that she has quit smoking. She has never used smokeless tobacco. She reports that she does not drink alcohol or use drugs.  Allergies  Allergen Reactions  . Clindamycin/Lincomycin Rash and Other (See Comments)    Heart problems     Family History  Problem Relation Age of Onset  . Coronary artery disease Brother   . Coronary artery disease Brother   . Coronary artery disease Brother       Prior to  Admission medications   Medication Sig Start Date End Date Taking? Authorizing Provider  amLODipine (NORVASC) 5 MG tablet Take 1 tablet (5 mg total) by mouth 2 (two) times daily after a meal. 03/06/16   [place], DO  aspirin EC 81 MG EC tablet Take 1 tablet (81 mg total) by mouth daily. 03/29/16   Geradine Girt, DO  atorvastatin (LIPITOR) 20 MG tablet Take 1 tablet (20 mg total) by mouth daily at 6 PM. 03/06/16   Kerney Elbe, DO  clopidogrel (PLAVIX) 75 MG tablet Take 75 mg by mouth daily.    Historical Provider, MD  ezetimibe (ZETIA) 10 MG tablet Take 10 mg by mouth daily.    Historical Provider, MD  feeding supplement, ENSURE ENLIVE, (ENSURE ENLIVE) LIQD Take 237 mLs by mouth 2 (two) times daily between meals. 03/06/16   Kerney Elbe, DO  hydrALAZINE (APRESOLINE) 50 MG tablet Take 1 tablet (50 mg total) by mouth every 8 (eight) hours. 03/06/16   [place], DO  isosorbide mononitrate (IMDUR) 60 MG 24 hr tablet Take 3 tablets (180 mg total) by mouth daily. 03/29/16   Geradine Girt, DO  lidocaine (LIDODERM) 5 % Place 1 patch onto the skin daily. Remove & Discard patch within 12 hours or as directed by MD 03/28/16   Geradine Girt, DO  memantine (NAMENDA) 10 MG tablet Take 10 mg by mouth 2 (two) times daily.    Historical Provider, MD  metoprolol tartrate (LOPRESSOR) 25 MG tablet Take 1 tablet (25 mg total) by mouth 2 (two) times daily. 03/06/16   [place], DO  pantoprazole (PROTONIX) 20 MG tablet Take 1 tablet (20 mg total) by mouth daily. 03/29/16   Geradine Girt, DO  polyethylene glycol (MIRALAX / GLYCOLAX) packet Take 17 g by mouth daily. 03/28/16   Geradine Girt, DO  potassium chloride SA (K-DUR,KLOR-CON) 20 MEQ tablet Take 1 tablet (20 mEq total) by mouth daily. 04/23/16   Daleen Bo, MD    Physical Exam: Vitals:   05/05/16 0201 05/05/16 0315 05/05/16 0345 05/05/16 0400  BP: 175/65 136/62 137/63 145/61  Pulse: 86 77 76 79  Resp: 16 14 13 14     Temp: 97.9 F (36.6 C)     SpO2: 98% 95% 96% 97%      Constitutional: NAD, calm, comfortable Eyes: PERRL, lids and conjunctivae normal ENMT: Mucous membranes are moist. Posterior pharynx clear of any exudate or lesions.Normal dentition.  Neck: normal, supple, no masses, no thyromegaly Respiratory: clear to auscultation bilaterally, no wheezing, no crackles. Normal respiratory effort. No accessory muscle use.  Cardiovascular: Regular rate and rhythm, no murmurs / rubs / gallops. No extremity edema. 2+ pedal pulses. No carotid bruits.  Abdomen: no tenderness, no masses palpated. No hepatosplenomegaly. Bowel sounds positive.  Musculoskeletal: no clubbing / cyanosis. No joint deformity upper and lower extremities. Good ROM, no contractures. Normal muscle tone.  Skin: no rashes, lesions, ulcers. No induration Neurologic: CN 2-12 grossly intact. Sensation intact, DTR normal. Strength 5/5 in all 4.  Psychiatric: Normal judgment and insight. Alert and oriented x 3. Normal mood.    Labs on Admission: I have personally reviewed following labs and imaging studies  CBC:  Recent Labs Lab 05/05/16 0251  WBC 7.8  NEUTROABS 4.6  HGB 13.2  HCT 39.1  MCV 86.5  PLT [id number]   Basic Metabolic Panel:  Recent Labs Lab 05/05/16 0251  NA 134*  K 2.8*  CL 99*  CO2 25  GLUCOSE 118*  BUN 20  CREATININE 1.41*  CALCIUM 9.6   GFR: Estimated Creatinine Clearance: 22.2 mL/min (by C-G formula based on SCr of 1.41 mg/dL (H)). Liver Function Tests: No results for input(s): AST, ALT, ALKPHOS, BILITOT, PROT, ALBUMIN in the last 168 hours. No results for input(s): LIPASE, AMYLASE in the last 168 hours. No results for input(s): AMMONIA in the last 168 hours. Coagulation Profile: No results for input(s): INR, PROTIME in the last 168 hours. Cardiac Enzymes: No results for input(s): CKTOTAL, CKMB, CKMBINDEX, TROPONINI in the last 168 hours. BNP (last 3 results) No results for input(s): PROBNP in the  last 8760 hours. HbA1C: No results for input(s): HGBA1C in the last 72 hours. CBG: No results for input(s): GLUCAP in the last 168 hours. Lipid Profile: No results for input(s): CHOL, HDL, LDLCALC, TRIG, CHOLHDL, LDLDIRECT in the last 72 hours. Thyroid Function Tests: No results for input(s): TSH, T4TOTAL, FREET4, T3FREE, THYROIDAB in the last 72 hours. Anemia Panel: No results for input(s): VITAMINB12, FOLATE, FERRITIN, TIBC, IRON, RETICCTPCT in the last 72 hours. Urine analysis:    Component Value Date/Time   COLORURINE YELLOW 01/31/2016 [place] 01/31/2016 1233   LABSPEC 1.015 01/31/2016 1233   PHURINE 6.5 01/31/2016 1233   GLUCOSEU NEGATIVE 01/31/2016 1233   HGBUR NEGATIVE 01/31/2016 1233   BILIRUBINUR NEGATIVE  01/31/2016 1233   [place] 01/31/2016 1233   PROTEINUR NEGATIVE 01/31/2016 1233   UROBILINOGEN 1.0 05/23/2012 0257   NITRITE NEGATIVE 01/31/2016 1233   LEUKOCYTESUR NEGATIVE 01/31/2016 1233   Sepsis Labs: @LABRCNTIP (procalcitonin:4,lacticidven:4) )No results found for this or any previous visit (from the past 240 hour(s)).   Radiological Exams on Admission: Dg Chest 2 View  Result Date: 05/05/2016 CLINICAL DATA:  82 year old female with chest pain and shortness of breath EXAM: CHEST  2 VIEW COMPARISON:  Chest radiograph dated 04/23/2016 FINDINGS: The lungs are clear. There is no pleural effusion or pneumothorax. The cardiac silhouette is within normal limits. Median sternotomy wires and CABG vascular clips noted. There is atherosclerotic calcification of the aortic arch. No acute osseous pathology identified. Bilateral breast implants noted. IMPRESSION: No acute cardiopulmonary process. Electronically Signed   By: Anner Crete M.D.   On: 05/05/2016 02:35    EKG: Independently reviewed.  Assessment/Plan Principal Problem:   Ischemic chest pain (HCC) Active Problems:   Dementia   Hx of CABG-3 surgeries, and LM stent   Hypertensive  heart disease without CHF   NSTEMI- Toponin 6.11 on 03/03/16    1. Ischemic chest pain - 1. CP obs pathway 2. Will try NTG paste / morphine for pain 3. I doubt cards will want to manage invasively (please see their consult note from Nov). 4. Consider palliative care consult due to ongoing CP 5. Continue imdur 2. Dementia - continue home meds 3. HTN - continue home meds   DVT prophylaxis: Lovenox Code Status: Full Family Communication: No family in room Consults called: None Admission status: Admit to obs   Allisha Harter, [facility] Pager [phone] from 7PM-7AM  If 7AM-7PM, please contact the day physician for the patient [email or web address] Password [email or web address]  05/05/2016, 4:20 AM

## 2016-05-05 NOTE — ED Notes (Signed)
Attempted IV, unsuccessful.  Lab bedside, pt to X-ray after blood drawl.

## 2016-05-05 NOTE — ED Triage Notes (Signed)
Pt to ED by EMS c/o chest pain. Per EMS, pt has dementia and called EMS for chest pain radiating into back; gave 324mg  ASA with improvement of pain. Pt still c/o L sided chest pain at present

## 2016-05-05 NOTE — ED Notes (Signed)
Delay in transport to floor, pt has no IV access with chest pain. IV team consulted

## 2016-05-05 NOTE — ED Notes (Signed)
IV attempted by two different RNs x 2 without success. IV team consulted

## 2016-05-06 DIAGNOSIS — R079 Chest pain, unspecified: Secondary | ICD-10-CM | POA: Diagnosis not present

## 2016-05-06 DIAGNOSIS — I209 Angina pectoris, unspecified: Secondary | ICD-10-CM | POA: Diagnosis not present

## 2016-05-06 LAB — BASIC METABOLIC PANEL
ANION GAP: 8 (ref 5–15)
BUN: 21 mg/dL — AB (ref 6–20)
CALCIUM: 9.2 mg/dL (ref 8.9–10.3)
CO2: 26 mmol/L (ref 22–32)
Chloride: 103 mmol/L (ref 101–111)
Creatinine, Ser: 1.3 mg/dL — ABNORMAL HIGH (ref 0.44–1.00)
GFR calc Af Amer: 42 mL/min — ABNORMAL LOW (ref >60)
GFR, EST NON AFRICAN AMERICAN: 36 mL/min — AB (ref >60)
Glucose, Bld: 105 mg/dL — ABNORMAL HIGH (ref 65–99)
POTASSIUM: 4 mmol/L (ref 3.5–5.1)
SODIUM: 137 mmol/L (ref 135–145)

## 2016-05-06 NOTE — Discharge Summary (Signed)
Physician Discharge Summary  [name] [id number] DOB: August 20, 1926 DOA: 05/05/2016  PCP: Mayra Neer, MD  Admit date: 05/05/2016 Discharge date: 05/06/2016  Admitted From: home Disposition:  home  Recommendations for Outpatient Follow-up:  Follow up with PCP in 2-4 weeks Long term SNF placement pending as an outpatient  Discharge Condition: stable CODE STATUS: Full Diet recommendation: heart healthy  HPI: [name] is a 82 y.o. female with medical history significant of CAD s/p CABG.  Had NSTEMI back in October, cards wanted to manage medically.  Recurrent CP admit in Nov, cards still wanted to manage medically. Patient presents to the ED with recurrent chest pain again.  Pain in central chest, radiating to back.  Is persistent but mild in the ED.  ASA 324 en route helped the pain.  Hasnt gotten NTG yet. ED Course: The EDP requests admission for CP.  Hospital Course: Discharge Diagnoses:  Principal Problem:   Ischemic chest pain ([place]) Active Problems:   Dementia   Hx of CABG-3 surgeries, and LM stent   Hypertensive heart disease without CHF   NSTEMI- Toponin 6.11 on 03/03/16  Chest pain - cardiology consulted, appreciate input, not a candidate for aggressive interventions due to underlying dementia, recommending palliative approach. Discussed with Dr. Wynonia Lawman, patient's primary cardiologist. Cardiac enzymes have remained flat 0.03 >> 0.04 >> 0.03 >> 0.03. Continue her current regimen. She has had multiple hospitalizations with chest pain with the same recommendation for conservative management. If the patients comes to the ER with recurrent chest pain it may be better to discuss with cardiology the need for admission prior to directly admitting the patient to the hospital.  Dementia - with paranoia, discussed with patient's son, he is looking into long term SNF and currently awaiting for a bed. He is currently hiring 24/7 private care at home to assist  patient at home and will prefer to take patient home. PT evaluated patient and did recommend SNF. HTN - continue home medications Goals of care - consulted palliative care   Discharge Instructions   Allergies as of 05/06/2016      Reactions   Clindamycin/lincomycin Rash, Other (See Comments)   Heart problems       Medication List    TAKE these medications   amLODipine 5 MG tablet Commonly known as:  NORVASC Take 1 tablet (5 mg total) by mouth 2 (two) times daily after a meal.   aspirin 81 MG EC tablet Take 1 tablet (81 mg total) by mouth daily.   atorvastatin 20 MG tablet Commonly known as:  LIPITOR Take 1 tablet (20 mg total) by mouth daily at 6 PM.   clopidogrel 75 MG tablet Commonly known as:  PLAVIX Take 75 mg by mouth daily.   ezetimibe 10 MG tablet Commonly known as:  ZETIA Take 10 mg by mouth daily.   feeding supplement (ENSURE ENLIVE) Liqd Take 237 mLs by mouth 2 (two) times daily between meals. What changed:  when to take this   hydrALAZINE 50 MG tablet Commonly known as:  APRESOLINE Take 1 tablet (50 mg total) by mouth every 8 (eight) hours. What changed:  when to take this   isosorbide mononitrate 60 MG 24 hr tablet Commonly known as:  IMDUR Take 3 tablets (180 mg total) by mouth daily. What changed:  how much to take  when to take this   lidocaine 5 % Commonly known as:  LIDODERM Place 1 patch onto the skin daily. Remove & Discard patch within 12  hours or as directed by MD   memantine 10 MG tablet Commonly known as:  NAMENDA Take 10 mg by mouth See admin instructions. Take 1 tablet (10 mg) by mouth daily with breakfast and lunch   metoprolol tartrate 25 MG tablet Commonly known as:  LOPRESSOR Take 1 tablet (25 mg total) by mouth 2 (two) times daily. What changed:  when to take this  additional instructions   pantoprazole 20 MG tablet Commonly known as:  PROTONIX Take 1 tablet (20 mg total) by mouth daily.   polyethylene glycol  packet Commonly known as:  MIRALAX / GLYCOLAX Take 17 g by mouth daily.   potassium chloride SA 20 MEQ tablet Commonly known as:  K-DUR,KLOR-CON Take 1 tablet (20 mEq total) by mouth daily.      Follow-up Information    SHAW,KIMBERLEE, MD. Schedule an appointment as soon as possible for a visit in 3 week(s).   Specialty:  Family Medicine Contact information: [address] [place] [address] [phone]          Allergies  Allergen Reactions  . Clindamycin/Lincomycin Rash and Other (See Comments)    Heart problems     Consultations:  Cardiology   Procedures/Studies:  Dg Chest 2 View  Result Date: 05/05/2016 CLINICAL DATA:  81 year old female with chest pain and shortness of breath EXAM: CHEST  2 VIEW COMPARISON:  Chest radiograph dated 04/23/2016 FINDINGS: The lungs are clear. There is no pleural effusion or pneumothorax. The cardiac silhouette is within normal limits. Median sternotomy wires and CABG vascular clips noted. There is atherosclerotic calcification of the aortic arch. No acute osseous pathology identified. Bilateral breast implants noted. IMPRESSION: No acute cardiopulmonary process. Electronically Signed   By: Anner Crete M.D.   On: 05/05/2016 02:35   Dg Chest 2 View  Result Date: 04/23/2016 CLINICAL DATA:  Chest pain and back pain, onset tonight. EXAM: CHEST  2 VIEW COMPARISON:  03/26/2016 FINDINGS: The lungs are clear. Pulmonary vasculature is normal. There is unchanged borderline cardiomegaly. Hilar and mediastinal contours are unremarkable and unchanged. Pulmonary vasculature is normal. No pleural effusions. IMPRESSION: No active cardiopulmonary disease. Electronically Signed   By: Andreas Newport M.D.   On: 04/23/2016 21:53      Subjective: - no chest pain, shortness of breath, no abdominal pain, nausea or vomiting.   Discharge Exam: Vitals:   05/06/16 0732 05/06/16 0855  BP:  (!) 127/52  Pulse: (!) 57 65  Resp: 13 13    Temp:     Vitals:   05/05/16 2018 05/06/16 0500 05/06/16 0732 05/06/16 0855  BP: (!) 133/55 (!) 139/58  (!) 127/52  Pulse: 68 63 (!) 57 65  Resp: 16 16 13 13   Temp: 98.7 F (37.1 C) 98.4 F (36.9 C)    TempSrc:      SpO2: 97% 97% 97% 97%  Weight:  50.8 kg (112 lb)      General: Pt is alert, awake, not in acute distress Cardiovascular: RRR, S1/S2 +, no rubs, no gallops Respiratory: CTA bilaterally, no wheezing, no rhonchi Abdominal: Soft, NT, ND, bowel sounds + Extremities: no edema, no cyanosis    The results of significant diagnostics from this hospitalization (including imaging, microbiology, ancillary and laboratory) are listed below for reference.     Microbiology: No results found for this or any previous visit (from the past 240 hour(s)).   Labs: BNP (last 3 results) No results for input(s): BNP in the last 8760 hours. Basic Metabolic Panel:  Recent Labs Lab 05/05/16 0251 05/06/16 0224  NA 134* 137  K 2.8* 4.0  CL 99* 103  CO2 25 26  GLUCOSE 118* 105*  BUN 20 21*  CREATININE 1.41* 1.30*  CALCIUM 9.6 9.2   CBC:  Recent Labs Lab 05/05/16 0251  WBC 7.8  NEUTROABS 4.6  HGB 13.2  HCT 39.1  MCV 86.5  PLT 294   Cardiac Enzymes:  Recent Labs Lab 05/05/16 0251 05/05/16 0650 05/05/16 0940  TROPONINI 0.03* 0.03* 0.03*   BNP: Invalid input(s): POCBNP CBG: No results for input(s): GLUCAP in the last 168 hours. D-Dimer No results for input(s): DDIMER in the last 72 hours. Hgb A1c No results for input(s): HGBA1C in the last 72 hours. Lipid Profile No results for input(s): CHOL, HDL, LDLCALC, TRIG, CHOLHDL, LDLDIRECT in the last 72 hours. Thyroid function studies No results for input(s): TSH, T4TOTAL, T3FREE, THYROIDAB in the last 72 hours.  Invalid input(s): FREET3 Anemia work up No results for input(s): VITAMINB12, FOLATE, FERRITIN, TIBC, IRON, RETICCTPCT in the last 72 hours. Urinalysis    Component Value Date/Time   COLORURINE YELLOW  01/31/2016 1233   APPEARANCEUR CLEAR 01/31/2016 1233   LABSPEC 1.015 01/31/2016 1233   PHURINE 6.5 01/31/2016 1233   GLUCOSEU NEGATIVE 01/31/2016 1233   HGBUR NEGATIVE 01/31/2016 1233   BILIRUBINUR NEGATIVE 01/31/2016 1233   KETONESUR NEGATIVE 01/31/2016 1233   PROTEINUR NEGATIVE 01/31/2016 1233   UROBILINOGEN 1.0 05/23/2012 0257   NITRITE NEGATIVE 01/31/2016 1233   LEUKOCYTESUR NEGATIVE 01/31/2016 1233   Sepsis Labs Invalid input(s): PROCALCITONIN,  WBC,  LACTICIDVEN Microbiology No results found for this or any previous visit (from the past 240 hour(s)).   Time coordinating discharge: Over 30 minutes  SIGNED:  Marzetta Board, MD  [facility] 05/06/2016, 4:16 PM Pager [phone]  If 7PM-7AM, please contact night-coverage [email or web address] Password [email or web address]

## 2016-05-06 NOTE — Evaluation (Signed)
Physical Therapy Evaluation Patient Details Name: [name] MRN: [id number] DOB: 01/09/1927 Today's Date: 05/06/2016   History of Present Illness   Patient is a 82 yo female with hx of CAD s/p CABG, HTN, HLD, PVD, dementia and CKD presents with chest pain.   Clinical Impression  Pt is demonstrating some difficulty with distances, light headed and requiring close monitoring but sats are steady at 93% by end of session, using portable telemetry to monitor mobility.  Will follow acutely and will need to be in a controlled environment with SNF for recovery safely.  Focus of PT will be strength and balance with all mobility.    Follow Up Recommendations SNF    Equipment Recommendations  None recommended by PT    Recommendations for Other Services       Precautions / Restrictions Precautions Precautions: Fall (light headed, on telemetry) Restrictions Weight Bearing Restrictions: No      Mobility  Bed Mobility Overal bed mobility: Needs Assistance Bed Mobility: Supine to Sit     Supine to sit: Min assist     General bed mobility comments: mainly assisted to scoot out to EOB  Transfers Overall transfer level: Needs assistance Equipment used: 1 person hand held assist;Rolling walker (2 wheeled) Transfers: Sit to/from [facility] Sit to Stand: Min assist Stand pivot transfers: Min assist       General transfer comment: cues for hand placement 100% of the time  Ambulation/Gait Ambulation/Gait assistance: Min assist Ambulation Distance (Feet): 120 Feet Assistive device: Rolling walker (2 wheeled);1 person hand held assist Gait Pattern/deviations: Step-through pattern;Decreased stride length;Narrow base of support;Trunk flexed Gait velocity: reduced Gait velocity interpretation: Below normal speed for age/gender    Stairs            Wheelchair Mobility    Modified Rankin (Stroke Patients Only)       Balance Overall balance  assessment: Needs assistance Sitting-balance support: Feet supported Sitting balance-Leahy Scale: Fair   Postural control: Posterior lean Standing balance support: Bilateral upper extremity supported Standing balance-Leahy Scale: Poor                               Pertinent Vitals/Pain Pain Assessment: No/denies pain    Home Living Family/patient expects to be discharged to:: Skilled nursing facility                      Prior Function Level of Independence: Independent (per pt)         Comments: PLOF was unclear due to dementia     Hand Dominance   Dominant Hand: Right    Extremity/Trunk Assessment   Upper Extremity Assessment Upper Extremity Assessment: Generalized weakness    Lower Extremity Assessment Lower Extremity Assessment: Generalized weakness    Cervical / Trunk Assessment Cervical / Trunk Assessment: Kyphotic  Communication   Communication: No difficulties  Cognition Arousal/Alertness: Awake/alert Behavior During Therapy: Anxious Overall Cognitive Status: History of cognitive impairments - at baseline                      General Comments      Exercises     Assessment/Plan    PT Assessment Patient needs continued PT services  PT Problem List Decreased strength;Decreased range of motion;Decreased activity tolerance;Decreased balance;Decreased mobility;Decreased coordination;Decreased cognition;Decreased knowledge of use of DME;Decreased safety awareness;Cardiopulmonary status limiting activity          PT  Treatment Interventions DME instruction;Gait training;Functional mobility training;Therapeutic activities;Therapeutic exercise;Balance training;Cognitive remediation    PT Goals (Current goals can be found in the Care Plan section)  Acute Rehab PT Goals Patient Stated Goal: none stated PT Goal Formulation: Patient unable to participate in goal setting Potential to Achieve Goals: Good    Frequency Min  2X/week   Barriers to discharge Decreased caregiver support;Inaccessible home environment Pt reports she has been home but chart indicates she may have come from SNF    Co-evaluation               End of Session Equipment Utilized During Treatment: Gait belt Activity Tolerance: Patient tolerated treatment well;Patient limited by fatigue Patient left: in chair;with call bell/phone within reach;with chair alarm set Nurse Communication: Mobility status         Time: [id number] PT Time Calculation (min) (ACUTE ONLY): 24 min   Charges:   PT Evaluation $PT Eval Moderate Complexity: 1 Procedure PT Treatments $Gait Training: 8-22 mins   PT G CodesRamond Dial May 07, 2016, 1:04 PM   Mee Hives, PT MS Acute Rehab Dept. Number: [place]

## 2016-05-06 NOTE — Progress Notes (Signed)
Subjective:  Patient is well known to me and has advanced dementia.  Despite the fact that I am her primary cardiologist and seen her for many years she did not recognize me when I walked in the room and when I mention my name states that she may have seen me when she was previously in the hospital.  She does not currently have any chest pain.  I saw her previously on her previous admission and she is clearly not a candidate for any further cardiac interventions due to her advanced dementia.  Objective:  Vital Signs in the last 24 hours: BP (!) 139/58   Pulse 63   Temp 98.4 F (36.9 C)   Resp 16   Wt 50.8 kg (112 lb)   SpO2 97%   BMI 20.49 kg/m   Physical Exam: Confused elderly female pleasant but in no acute distress Lungs:  Clear Cardiac:  Regular rhythm, normal S1 and S2, no S3 Extremities:  No edema present  Intake/Output from previous day: 12/15 0701 - 12/16 0700 In: 360 [P.O.:360] Out: 1300 [Urine:1300]  Weight Filed Weights   05/06/16 0500  Weight: 50.8 kg (112 lb)    Lab Results: Basic Metabolic Panel:  Recent Labs  05/05/16 0251 05/06/16 0224  NA 134* 137  K 2.8* 4.0  CL 99* 103  CO2 25 26  GLUCOSE 118* 105*  BUN 20 21*  CREATININE 1.41* 1.30*   CBC:  Recent Labs  05/05/16 0251  WBC 7.8  NEUTROABS 4.6  HGB 13.2  HCT 39.1  MCV 86.5  PLT 294   Cardiac Enzymes: Troponin (Point of Care Test)  Recent Labs  05/05/16 0256  TROPIPOC 0.04   Cardiac Panel (last 3 results)  Recent Labs  05/05/16 0251 05/05/16 0650 05/05/16 0940  TROPONINI 0.03* 0.03* 0.03*    Telemetry: Sinus rhythm  Assessment/Plan:  She clearly has advanced dementia.  Dr. Burt Knack has also seen the patient and has also agreed that she is not a candidate for further cardiac intervention or cardiac testing.  The dementia makes treatment and evaluation of the chest pain quite difficult in that she will call EMS for it.  Her relative is a son and at this point she probably  would be better in a skilled nursing facility but this is been difficult to arrange in the past also.  Will see as needed.     Kerry Hough  MD [facility]  05/06/2016, 8:42 AM

## 2016-05-06 NOTE — Discharge Planning (Signed)
CSW notified by MD, pt ready for DC. CSW spoke to pt's son regarding SNF placement. Son reports he doesn't want SNF placement and is working on long term placement for his mom. Son reports he is working with [place] and in the process of selling pt's home. Son has hired [employer] to come into pt's home 24/7 until he can arrange long term care. Son reports no other needs at this time. Son will pick pt up around 330 today. MD and nurse notified. CSW will remain available for support as needed.  Capricia Serda B. Joline Maxcy Clinical Social Work Dept Weekend Social Worker [phone] 1:53 PM

## 2016-05-11 NOTE — Progress Notes (Signed)
Late entry for missed G-code. Based on review of the evaluation and goals by Mee Hives, PT.   2016/05/27 1305  PT G-Codes **NOT FOR INPATIENT CLASS**  Functional Assessment Tool Used clinical judgement based on review of medical record   Functional Limitation Mobility: Walking and moving around  Mobility: Walking and Moving Around Current Status [id number]) CJ  Mobility: Walking and Moving Around Goal Status [phone]) CI  Lavonia Dana, [place]  [phone] 05/11/2016

## 2016-05-29 DIAGNOSIS — I1310 Hypertensive heart and chronic kidney disease without heart failure, with stage 1 through stage 4 chronic kidney disease, or unspecified chronic kidney disease: Secondary | ICD-10-CM | POA: Diagnosis not present

## 2016-05-29 DIAGNOSIS — Z9189 Other specified personal risk factors, not elsewhere classified: Secondary | ICD-10-CM | POA: Diagnosis not present

## 2016-05-29 DIAGNOSIS — E782 Mixed hyperlipidemia: Secondary | ICD-10-CM | POA: Diagnosis not present

## 2016-05-29 DIAGNOSIS — E1122 Type 2 diabetes mellitus with diabetic chronic kidney disease: Secondary | ICD-10-CM | POA: Diagnosis not present

## 2016-05-29 DIAGNOSIS — N183 Chronic kidney disease, stage 3 (moderate): Secondary | ICD-10-CM | POA: Diagnosis not present

## 2016-05-29 DIAGNOSIS — I209 Angina pectoris, unspecified: Secondary | ICD-10-CM | POA: Diagnosis not present

## 2016-05-29 DIAGNOSIS — F0390 Unspecified dementia without behavioral disturbance: Secondary | ICD-10-CM | POA: Diagnosis not present

## 2016-05-29 DIAGNOSIS — E46 Unspecified protein-calorie malnutrition: Secondary | ICD-10-CM | POA: Diagnosis not present

## 2016-09-21 DIAGNOSIS — E782 Mixed hyperlipidemia: Secondary | ICD-10-CM | POA: Diagnosis not present

## 2016-09-21 DIAGNOSIS — I1310 Hypertensive heart and chronic kidney disease without heart failure, with stage 1 through stage 4 chronic kidney disease, or unspecified chronic kidney disease: Secondary | ICD-10-CM | POA: Diagnosis not present

## 2016-09-21 DIAGNOSIS — K219 Gastro-esophageal reflux disease without esophagitis: Secondary | ICD-10-CM | POA: Diagnosis not present

## 2016-09-21 DIAGNOSIS — I209 Angina pectoris, unspecified: Secondary | ICD-10-CM | POA: Diagnosis not present

## 2016-09-21 DIAGNOSIS — N183 Chronic kidney disease, stage 3 (moderate): Secondary | ICD-10-CM | POA: Diagnosis not present

## 2016-09-21 DIAGNOSIS — I25118 Atherosclerotic heart disease of native coronary artery with other forms of angina pectoris: Secondary | ICD-10-CM | POA: Diagnosis not present

## 2016-09-21 DIAGNOSIS — E1122 Type 2 diabetes mellitus with diabetic chronic kidney disease: Secondary | ICD-10-CM | POA: Diagnosis not present

## 2016-09-21 DIAGNOSIS — F0390 Unspecified dementia without behavioral disturbance: Secondary | ICD-10-CM | POA: Diagnosis not present

## 2016-09-21 DIAGNOSIS — F322 Major depressive disorder, single episode, severe without psychotic features: Secondary | ICD-10-CM | POA: Diagnosis not present

## 2016-09-21 DIAGNOSIS — E46 Unspecified protein-calorie malnutrition: Secondary | ICD-10-CM | POA: Diagnosis not present

## 2016-12-22 DIAGNOSIS — I209 Angina pectoris, unspecified: Secondary | ICD-10-CM | POA: Diagnosis not present

## 2016-12-22 DIAGNOSIS — I1310 Hypertensive heart and chronic kidney disease without heart failure, with stage 1 through stage 4 chronic kidney disease, or unspecified chronic kidney disease: Secondary | ICD-10-CM | POA: Diagnosis not present

## 2016-12-22 DIAGNOSIS — R6889 Other general symptoms and signs: Secondary | ICD-10-CM | POA: Diagnosis not present

## 2016-12-22 DIAGNOSIS — K219 Gastro-esophageal reflux disease without esophagitis: Secondary | ICD-10-CM | POA: Diagnosis not present

## 2016-12-22 DIAGNOSIS — F322 Major depressive disorder, single episode, severe without psychotic features: Secondary | ICD-10-CM | POA: Diagnosis not present

## 2016-12-22 DIAGNOSIS — N183 Chronic kidney disease, stage 3 (moderate): Secondary | ICD-10-CM | POA: Diagnosis not present

## 2016-12-22 DIAGNOSIS — E782 Mixed hyperlipidemia: Secondary | ICD-10-CM | POA: Diagnosis not present

## 2016-12-22 DIAGNOSIS — I25118 Atherosclerotic heart disease of native coronary artery with other forms of angina pectoris: Secondary | ICD-10-CM | POA: Diagnosis not present

## 2016-12-22 DIAGNOSIS — E46 Unspecified protein-calorie malnutrition: Secondary | ICD-10-CM | POA: Diagnosis not present

## 2016-12-22 DIAGNOSIS — F0390 Unspecified dementia without behavioral disturbance: Secondary | ICD-10-CM | POA: Diagnosis not present

## 2016-12-22 DIAGNOSIS — E1122 Type 2 diabetes mellitus with diabetic chronic kidney disease: Secondary | ICD-10-CM | POA: Diagnosis not present

## 2017-01-03 DIAGNOSIS — E871 Hypo-osmolality and hyponatremia: Secondary | ICD-10-CM | POA: Diagnosis not present

## 2017-01-08 DIAGNOSIS — E871 Hypo-osmolality and hyponatremia: Secondary | ICD-10-CM | POA: Diagnosis not present

## 2017-02-07 ENCOUNTER — Other Ambulatory Visit: Payer: Self-pay | Admitting: Family Medicine

## 2017-02-07 DIAGNOSIS — I1310 Hypertensive heart and chronic kidney disease without heart failure, with stage 1 through stage 4 chronic kidney disease, or unspecified chronic kidney disease: Secondary | ICD-10-CM | POA: Diagnosis not present

## 2017-02-07 DIAGNOSIS — L858 Other specified epidermal thickening: Secondary | ICD-10-CM | POA: Diagnosis not present

## 2017-02-07 DIAGNOSIS — Z23 Encounter for immunization: Secondary | ICD-10-CM | POA: Diagnosis not present

## 2017-02-07 DIAGNOSIS — C44629 Squamous cell carcinoma of skin of left upper limb, including shoulder: Secondary | ICD-10-CM | POA: Diagnosis not present

## 2017-02-07 DIAGNOSIS — E871 Hypo-osmolality and hyponatremia: Secondary | ICD-10-CM | POA: Diagnosis not present

## 2017-02-23 DIAGNOSIS — E871 Hypo-osmolality and hyponatremia: Secondary | ICD-10-CM | POA: Diagnosis not present

## 2017-03-08 DIAGNOSIS — E871 Hypo-osmolality and hyponatremia: Secondary | ICD-10-CM | POA: Diagnosis not present

## 2017-03-08 DIAGNOSIS — I1310 Hypertensive heart and chronic kidney disease without heart failure, with stage 1 through stage 4 chronic kidney disease, or unspecified chronic kidney disease: Secondary | ICD-10-CM | POA: Diagnosis not present

## 2017-04-02 DIAGNOSIS — F0390 Unspecified dementia without behavioral disturbance: Secondary | ICD-10-CM | POA: Diagnosis not present

## 2017-04-02 DIAGNOSIS — C44629 Squamous cell carcinoma of skin of left upper limb, including shoulder: Secondary | ICD-10-CM | POA: Diagnosis not present

## 2017-04-02 DIAGNOSIS — E46 Unspecified protein-calorie malnutrition: Secondary | ICD-10-CM | POA: Diagnosis not present

## 2017-04-02 DIAGNOSIS — I25118 Atherosclerotic heart disease of native coronary artery with other forms of angina pectoris: Secondary | ICD-10-CM | POA: Diagnosis not present

## 2017-04-02 DIAGNOSIS — E782 Mixed hyperlipidemia: Secondary | ICD-10-CM | POA: Diagnosis not present

## 2017-04-02 DIAGNOSIS — N183 Chronic kidney disease, stage 3 (moderate): Secondary | ICD-10-CM | POA: Diagnosis not present

## 2017-04-02 DIAGNOSIS — E1122 Type 2 diabetes mellitus with diabetic chronic kidney disease: Secondary | ICD-10-CM | POA: Diagnosis not present

## 2017-04-02 DIAGNOSIS — I1310 Hypertensive heart and chronic kidney disease without heart failure, with stage 1 through stage 4 chronic kidney disease, or unspecified chronic kidney disease: Secondary | ICD-10-CM | POA: Diagnosis not present

## 2017-07-06 DIAGNOSIS — E46 Unspecified protein-calorie malnutrition: Secondary | ICD-10-CM | POA: Diagnosis not present

## 2017-07-06 DIAGNOSIS — F0390 Unspecified dementia without behavioral disturbance: Secondary | ICD-10-CM | POA: Diagnosis not present

## 2017-07-06 DIAGNOSIS — I25118 Atherosclerotic heart disease of native coronary artery with other forms of angina pectoris: Secondary | ICD-10-CM | POA: Diagnosis not present

## 2017-07-06 DIAGNOSIS — N183 Chronic kidney disease, stage 3 (moderate): Secondary | ICD-10-CM | POA: Diagnosis not present

## 2017-07-06 DIAGNOSIS — R0600 Dyspnea, unspecified: Secondary | ICD-10-CM | POA: Diagnosis not present

## 2017-07-06 DIAGNOSIS — E782 Mixed hyperlipidemia: Secondary | ICD-10-CM | POA: Diagnosis not present

## 2017-07-06 DIAGNOSIS — I1310 Hypertensive heart and chronic kidney disease without heart failure, with stage 1 through stage 4 chronic kidney disease, or unspecified chronic kidney disease: Secondary | ICD-10-CM | POA: Diagnosis not present

## 2017-07-06 DIAGNOSIS — E1122 Type 2 diabetes mellitus with diabetic chronic kidney disease: Secondary | ICD-10-CM | POA: Diagnosis not present

## 2017-07-17 DIAGNOSIS — N179 Acute kidney failure, unspecified: Secondary | ICD-10-CM | POA: Diagnosis not present

## 2017-07-17 DIAGNOSIS — I509 Heart failure, unspecified: Secondary | ICD-10-CM | POA: Diagnosis not present

## 2017-07-18 ENCOUNTER — Ambulatory Visit (INDEPENDENT_AMBULATORY_CARE_PROVIDER_SITE_OTHER): Payer: Medicare Other | Admitting: Podiatry

## 2017-07-18 ENCOUNTER — Encounter: Payer: Self-pay | Admitting: Podiatry

## 2017-07-18 VITALS — BP 170/76 | HR 89

## 2017-07-18 DIAGNOSIS — M79675 Pain in left toe(s): Secondary | ICD-10-CM

## 2017-07-18 DIAGNOSIS — E1159 Type 2 diabetes mellitus with other circulatory complications: Secondary | ICD-10-CM

## 2017-07-18 DIAGNOSIS — B351 Tinea unguium: Secondary | ICD-10-CM

## 2017-07-18 DIAGNOSIS — M79674 Pain in right toe(s): Secondary | ICD-10-CM | POA: Diagnosis not present

## 2017-07-18 NOTE — Progress Notes (Signed)
   Subjective:    Patient ID: [name], female    DOB: June 30, 1926, 84 y.o.   MRN: [id number]  HPI this patient presents the office for evaluation of her feet and trimming of her toenails.  She is diabetic and has dementia.  She is also taking Plavix.  She was referred by [facility] for [facility] care.  She presents for preventative foot care services. Patient was accompanied to this appointment with her son.    Review of Systems  All other systems reviewed and are negative.      Objective:   Physical Exam General Appearance  Alert, conversant and in no acute stress.  Vascular  Dorsalis pedis and posterior pulses are palpable  bilaterally.  Capillary return is within normal limits  bilaterally. Temperature is within normal limits  Bilaterally.  Neurologic  Senn-Weinstein monofilament wire test deferred.. Muscle power within normal limits bilaterally.  Nails Thick disfigured discolored nails with subungual debris bilaterally from hallux to fifth toes bilaterally. No evidence of bacterial infection or drainage bilaterally.  Orthopedic  No limitations of motion of motion feet bilaterally.  No crepitus or effusions noted.  No bony pathology or digital deformities noted.  Skin  normotropic skin with no porokeratosis noted bilaterally.  No signs of infections or ulcers noted.          Assessment & Plan:  Onychomycosis  B/L  Diabetes with no foot complications.   IE  Debride nails  X 10.  Patient had not had her nails done for months.  After I had to provided her nails and evaluated her foot condition. She pointed out that the nail care had nails that were prominent.  . She then checked each of her nails.  I don't checked her nails and found 1 to be prominent and that one was debridement.  Patient was told to return to the office in 3 months.   Gardiner Barefoot DPM

## 2017-08-03 DIAGNOSIS — N179 Acute kidney failure, unspecified: Secondary | ICD-10-CM | POA: Diagnosis not present

## 2017-08-03 DIAGNOSIS — I509 Heart failure, unspecified: Secondary | ICD-10-CM | POA: Diagnosis not present

## 2017-08-21 DIAGNOSIS — F0390 Unspecified dementia without behavioral disturbance: Secondary | ICD-10-CM | POA: Diagnosis not present

## 2017-08-24 DIAGNOSIS — N179 Acute kidney failure, unspecified: Secondary | ICD-10-CM | POA: Diagnosis not present

## 2017-09-04 ENCOUNTER — Other Ambulatory Visit: Payer: Self-pay | Admitting: Family Medicine

## 2017-09-04 ENCOUNTER — Ambulatory Visit
Admission: RE | Admit: 2017-09-04 | Discharge: 2017-09-04 | Disposition: A | Discharge status: Home / Self Care | Payer: Medicare Other | Source: Ambulatory Visit | Attending: Family Medicine | Admitting: Family Medicine

## 2017-09-04 DIAGNOSIS — M79644 Pain in right finger(s): Secondary | ICD-10-CM

## 2017-09-04 DIAGNOSIS — M19041 Primary osteoarthritis, right hand: Secondary | ICD-10-CM | POA: Diagnosis not present

## 2017-09-04 DIAGNOSIS — B070 Plantar wart: Secondary | ICD-10-CM | POA: Diagnosis not present

## 2017-10-04 ENCOUNTER — Telehealth: Payer: Self-pay | Admitting: Licensed Clinical Social Worker

## 2017-10-10 IMAGING — DX DG CHEST 2V
2 series · 2 of 2 positions shown · non-contrast
Comparison: Chest radiograph dated 04/23/2016

CLINICAL DATA: 89-year-old female with chest pain and shortness of
breath

EXAM:
CHEST  2 VIEW

[chest lat]
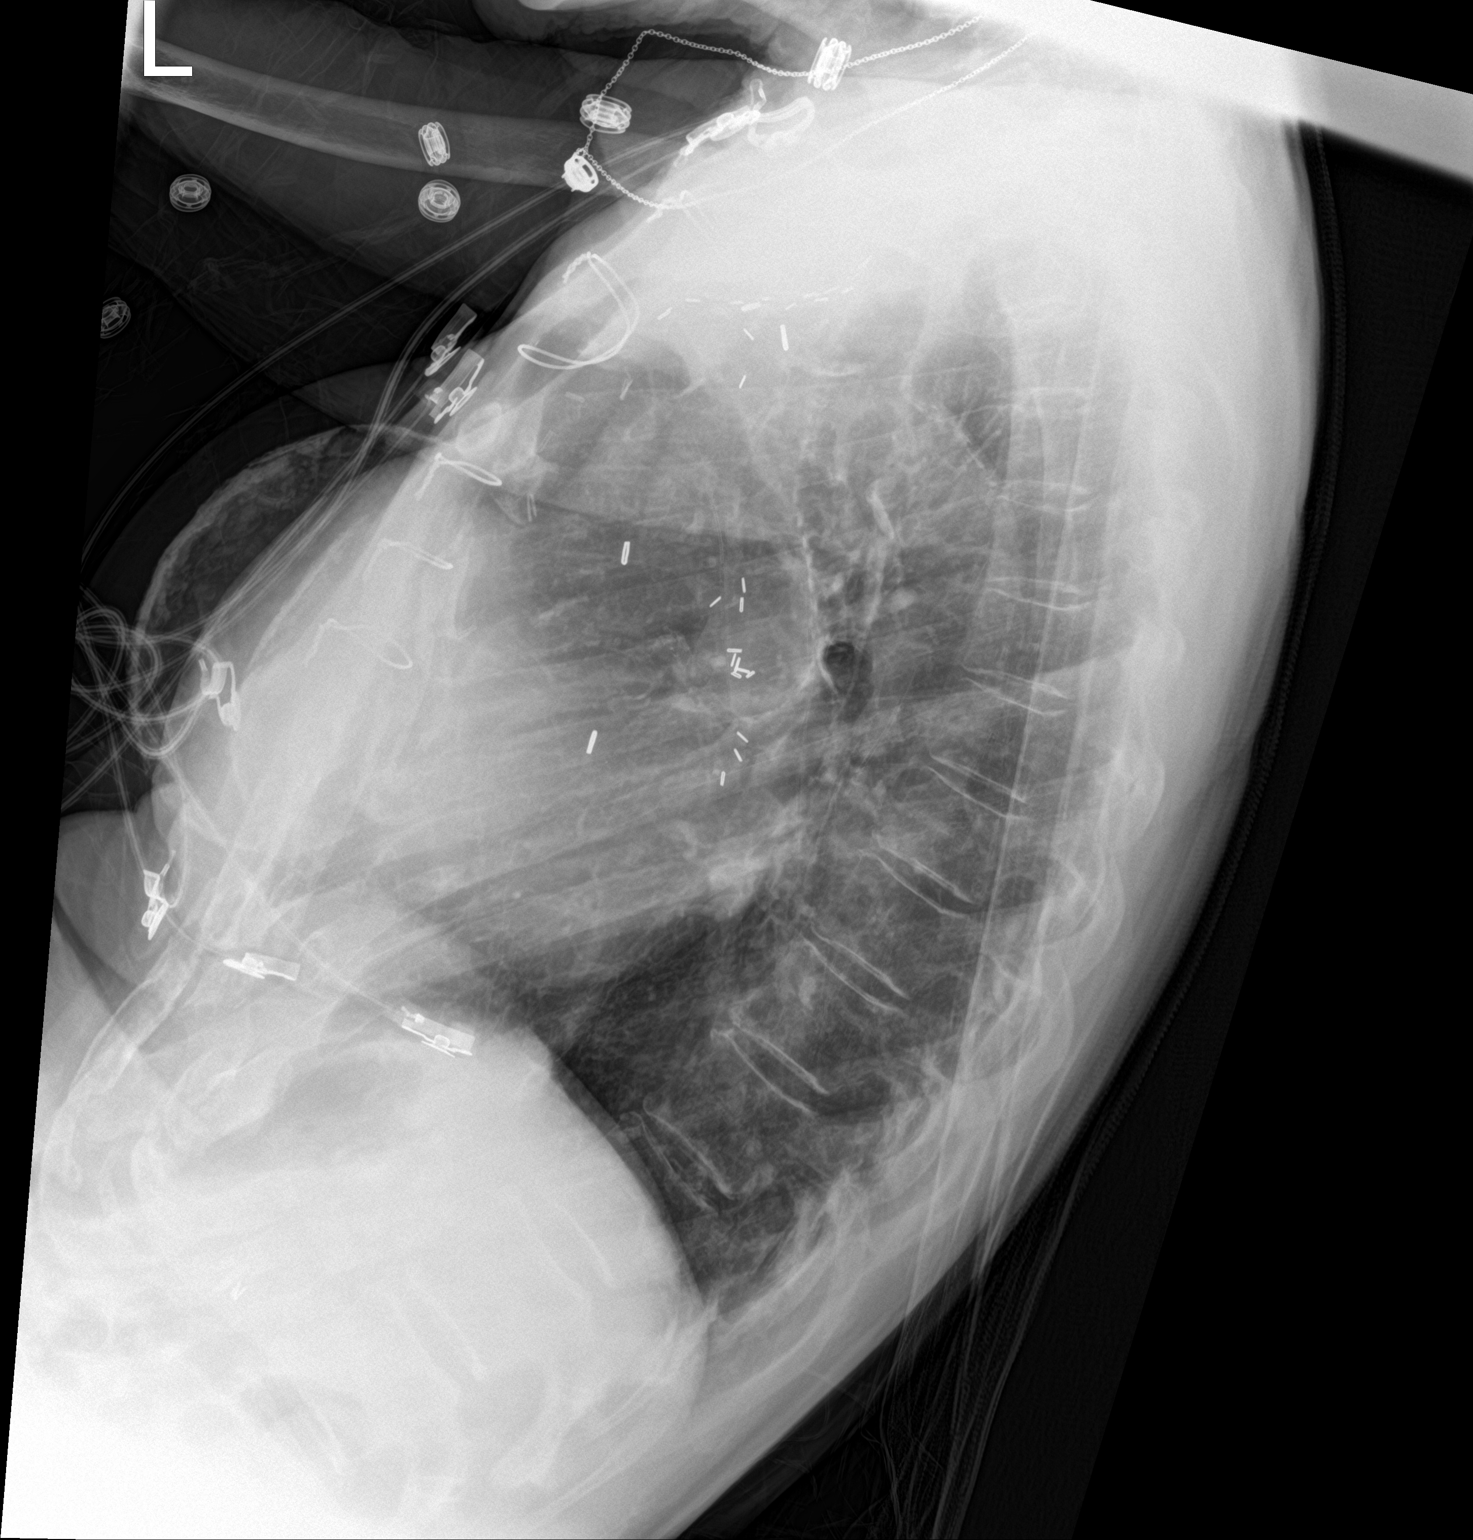

[chest ap]
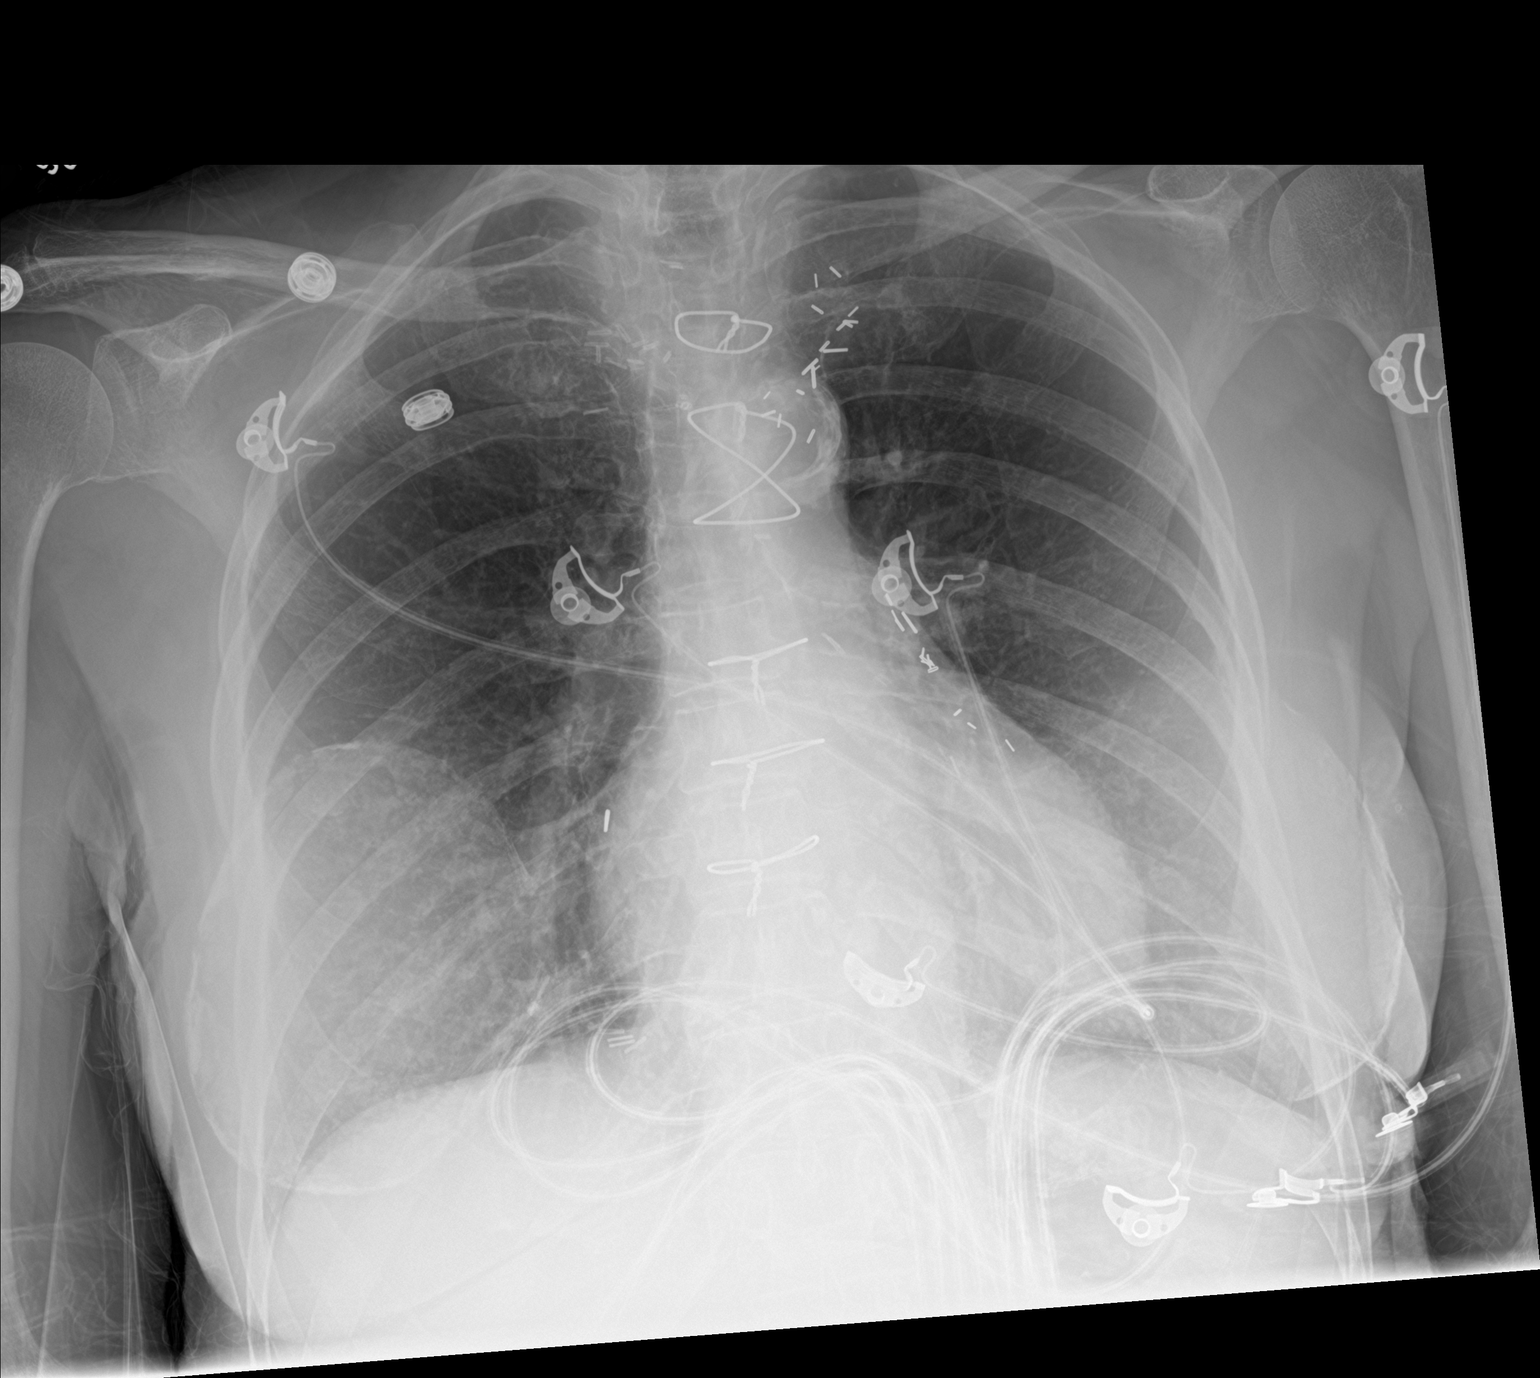

[2 of 2 positions shown; findings below may reference images not displayed]

FINDINGS: The lungs are clear. There is no pleural effusion or pneumothorax.
The cardiac silhouette is within normal limits. Median sternotomy
wires and CABG vascular clips noted. There is atherosclerotic
calcification of the aortic arch. No acute osseous pathology
identified. Bilateral breast implants noted.
IMPRESSION: No acute cardiopulmonary process.

## 2017-10-12 DIAGNOSIS — N183 Chronic kidney disease, stage 3 (moderate): Secondary | ICD-10-CM | POA: Diagnosis not present

## 2017-10-12 DIAGNOSIS — F322 Major depressive disorder, single episode, severe without psychotic features: Secondary | ICD-10-CM | POA: Diagnosis not present

## 2017-10-12 DIAGNOSIS — I509 Heart failure, unspecified: Secondary | ICD-10-CM | POA: Diagnosis not present

## 2017-10-12 DIAGNOSIS — E46 Unspecified protein-calorie malnutrition: Secondary | ICD-10-CM | POA: Diagnosis not present

## 2017-10-12 DIAGNOSIS — E1122 Type 2 diabetes mellitus with diabetic chronic kidney disease: Secondary | ICD-10-CM | POA: Diagnosis not present

## 2017-10-12 DIAGNOSIS — F0390 Unspecified dementia without behavioral disturbance: Secondary | ICD-10-CM | POA: Diagnosis not present

## 2017-10-12 DIAGNOSIS — E782 Mixed hyperlipidemia: Secondary | ICD-10-CM | POA: Diagnosis not present

## 2017-10-12 DIAGNOSIS — I1310 Hypertensive heart and chronic kidney disease without heart failure, with stage 1 through stage 4 chronic kidney disease, or unspecified chronic kidney disease: Secondary | ICD-10-CM | POA: Diagnosis not present

## 2018-03-05 DIAGNOSIS — K219 Gastro-esophageal reflux disease without esophagitis: Secondary | ICD-10-CM | POA: Diagnosis not present

## 2018-03-05 DIAGNOSIS — R809 Proteinuria, unspecified: Secondary | ICD-10-CM | POA: Diagnosis not present

## 2018-03-05 DIAGNOSIS — E46 Unspecified protein-calorie malnutrition: Secondary | ICD-10-CM | POA: Diagnosis not present

## 2018-03-05 DIAGNOSIS — Z23 Encounter for immunization: Secondary | ICD-10-CM | POA: Diagnosis not present

## 2018-03-05 DIAGNOSIS — F0390 Unspecified dementia without behavioral disturbance: Secondary | ICD-10-CM | POA: Diagnosis not present

## 2018-03-05 DIAGNOSIS — I509 Heart failure, unspecified: Secondary | ICD-10-CM | POA: Diagnosis not present

## 2018-03-05 DIAGNOSIS — N183 Chronic kidney disease, stage 3 (moderate): Secondary | ICD-10-CM | POA: Diagnosis not present

## 2018-03-05 DIAGNOSIS — I1310 Hypertensive heart and chronic kidney disease without heart failure, with stage 1 through stage 4 chronic kidney disease, or unspecified chronic kidney disease: Secondary | ICD-10-CM | POA: Diagnosis not present

## 2018-03-05 DIAGNOSIS — I209 Angina pectoris, unspecified: Secondary | ICD-10-CM | POA: Diagnosis not present

## 2018-03-05 DIAGNOSIS — E1122 Type 2 diabetes mellitus with diabetic chronic kidney disease: Secondary | ICD-10-CM | POA: Diagnosis not present

## 2018-04-01 DIAGNOSIS — C4490 Unspecified malignant neoplasm of skin, unspecified: Secondary | ICD-10-CM | POA: Diagnosis not present

## 2018-04-16 DIAGNOSIS — C44722 Squamous cell carcinoma of skin of right lower limb, including hip: Secondary | ICD-10-CM | POA: Diagnosis not present

## 2018-05-02 ENCOUNTER — Telehealth: Payer: Self-pay

## 2018-05-02 NOTE — Telephone Encounter (Signed)
Phone call placed to patient's son to inquire if he would like to schedule a follow up visit with Palliative Care. VM left

## 2018-06-14 DIAGNOSIS — I1310 Hypertensive heart and chronic kidney disease without heart failure, with stage 1 through stage 4 chronic kidney disease, or unspecified chronic kidney disease: Secondary | ICD-10-CM | POA: Diagnosis not present

## 2018-06-14 DIAGNOSIS — K219 Gastro-esophageal reflux disease without esophagitis: Secondary | ICD-10-CM | POA: Diagnosis not present

## 2018-06-14 DIAGNOSIS — E46 Unspecified protein-calorie malnutrition: Secondary | ICD-10-CM | POA: Diagnosis not present

## 2018-06-14 DIAGNOSIS — Z0001 Encounter for general adult medical examination with abnormal findings: Secondary | ICD-10-CM | POA: Diagnosis not present

## 2018-06-14 DIAGNOSIS — E1122 Type 2 diabetes mellitus with diabetic chronic kidney disease: Secondary | ICD-10-CM | POA: Diagnosis not present

## 2018-06-14 DIAGNOSIS — I25118 Atherosclerotic heart disease of native coronary artery with other forms of angina pectoris: Secondary | ICD-10-CM | POA: Diagnosis not present

## 2018-06-14 DIAGNOSIS — F322 Major depressive disorder, single episode, severe without psychotic features: Secondary | ICD-10-CM | POA: Diagnosis not present

## 2018-06-14 DIAGNOSIS — N183 Chronic kidney disease, stage 3 (moderate): Secondary | ICD-10-CM | POA: Diagnosis not present

## 2018-06-14 DIAGNOSIS — Z23 Encounter for immunization: Secondary | ICD-10-CM | POA: Diagnosis not present

## 2018-06-14 DIAGNOSIS — E782 Mixed hyperlipidemia: Secondary | ICD-10-CM | POA: Diagnosis not present

## 2018-06-14 DIAGNOSIS — F0390 Unspecified dementia without behavioral disturbance: Secondary | ICD-10-CM | POA: Diagnosis not present

## 2018-06-14 DIAGNOSIS — I509 Heart failure, unspecified: Secondary | ICD-10-CM | POA: Diagnosis not present

## 2018-10-15 ENCOUNTER — Inpatient Hospital Stay (HOSPITAL_COMMUNITY)
Admission: EM | Admit: 2018-10-15 | Discharge: 2018-10-18 | DRG: 064 | Disposition: A | Discharge status: Hospice | Payer: Medicare Other | Attending: Internal Medicine | Admitting: Internal Medicine

## 2018-10-15 ENCOUNTER — Other Ambulatory Visit: Payer: Self-pay

## 2018-10-15 ENCOUNTER — Emergency Department (HOSPITAL_COMMUNITY): Payer: Medicare Other

## 2018-10-15 ENCOUNTER — Inpatient Hospital Stay (HOSPITAL_COMMUNITY): Payer: Medicare Other

## 2018-10-15 DIAGNOSIS — I63213 Cerebral infarction due to unspecified occlusion or stenosis of bilateral vertebral arteries: Secondary | ICD-10-CM | POA: Diagnosis not present

## 2018-10-15 DIAGNOSIS — Z79899 Other long term (current) drug therapy: Secondary | ICD-10-CM

## 2018-10-15 DIAGNOSIS — I255 Ischemic cardiomyopathy: Secondary | ICD-10-CM | POA: Diagnosis present

## 2018-10-15 DIAGNOSIS — I5042 Chronic combined systolic (congestive) and diastolic (congestive) heart failure: Secondary | ICD-10-CM | POA: Diagnosis present

## 2018-10-15 DIAGNOSIS — Z741 Need for assistance with personal care: Secondary | ICD-10-CM | POA: Diagnosis not present

## 2018-10-15 DIAGNOSIS — I700 Atherosclerosis of aorta: Secondary | ICD-10-CM | POA: Diagnosis not present

## 2018-10-15 DIAGNOSIS — Z1159 Encounter for screening for other viral diseases: Secondary | ICD-10-CM

## 2018-10-15 DIAGNOSIS — Z8249 Family history of ischemic heart disease and other diseases of the circulatory system: Secondary | ICD-10-CM | POA: Diagnosis not present

## 2018-10-15 DIAGNOSIS — I509 Heart failure, unspecified: Secondary | ICD-10-CM | POA: Diagnosis not present

## 2018-10-15 DIAGNOSIS — R4701 Aphasia: Secondary | ICD-10-CM | POA: Diagnosis present

## 2018-10-15 DIAGNOSIS — Z9049 Acquired absence of other specified parts of digestive tract: Secondary | ICD-10-CM | POA: Diagnosis not present

## 2018-10-15 DIAGNOSIS — R1110 Vomiting, unspecified: Secondary | ICD-10-CM | POA: Diagnosis not present

## 2018-10-15 DIAGNOSIS — R0902 Hypoxemia: Secondary | ICD-10-CM | POA: Diagnosis not present

## 2018-10-15 DIAGNOSIS — E785 Hyperlipidemia, unspecified: Secondary | ICD-10-CM | POA: Diagnosis present

## 2018-10-15 DIAGNOSIS — I70209 Unspecified atherosclerosis of native arteries of extremities, unspecified extremity: Secondary | ICD-10-CM | POA: Diagnosis not present

## 2018-10-15 DIAGNOSIS — I480 Paroxysmal atrial fibrillation: Secondary | ICD-10-CM | POA: Diagnosis present

## 2018-10-15 DIAGNOSIS — G40119 Localization-related (focal) (partial) symptomatic epilepsy and epileptic syndromes with simple partial seizures, intractable, without status epilepticus: Secondary | ICD-10-CM | POA: Diagnosis not present

## 2018-10-15 DIAGNOSIS — R4020 Unspecified coma: Secondary | ICD-10-CM | POA: Diagnosis not present

## 2018-10-15 DIAGNOSIS — E1151 Type 2 diabetes mellitus with diabetic peripheral angiopathy without gangrene: Secondary | ICD-10-CM | POA: Diagnosis present

## 2018-10-15 DIAGNOSIS — E876 Hypokalemia: Secondary | ICD-10-CM | POA: Diagnosis present

## 2018-10-15 DIAGNOSIS — Z66 Do not resuscitate: Secondary | ICD-10-CM | POA: Diagnosis present

## 2018-10-15 DIAGNOSIS — Z209 Contact with and (suspected) exposure to unspecified communicable disease: Secondary | ICD-10-CM | POA: Diagnosis not present

## 2018-10-15 DIAGNOSIS — I10 Essential (primary) hypertension: Secondary | ICD-10-CM | POA: Diagnosis not present

## 2018-10-15 DIAGNOSIS — G9389 Other specified disorders of brain: Secondary | ICD-10-CM | POA: Diagnosis not present

## 2018-10-15 DIAGNOSIS — R531 Weakness: Secondary | ICD-10-CM | POA: Diagnosis not present

## 2018-10-15 DIAGNOSIS — I4891 Unspecified atrial fibrillation: Secondary | ICD-10-CM

## 2018-10-15 DIAGNOSIS — E1159 Type 2 diabetes mellitus with other circulatory complications: Secondary | ICD-10-CM | POA: Diagnosis present

## 2018-10-15 DIAGNOSIS — Z515 Encounter for palliative care: Secondary | ICD-10-CM | POA: Diagnosis present

## 2018-10-15 DIAGNOSIS — I350 Nonrheumatic aortic (valve) stenosis: Secondary | ICD-10-CM | POA: Diagnosis present

## 2018-10-15 DIAGNOSIS — Z03818 Encounter for observation for suspected exposure to other biological agents ruled out: Secondary | ICD-10-CM | POA: Diagnosis not present

## 2018-10-15 DIAGNOSIS — R29810 Facial weakness: Secondary | ICD-10-CM | POA: Diagnosis not present

## 2018-10-15 DIAGNOSIS — Z951 Presence of aortocoronary bypass graft: Secondary | ICD-10-CM | POA: Diagnosis not present

## 2018-10-15 DIAGNOSIS — I2510 Atherosclerotic heart disease of native coronary artery without angina pectoris: Secondary | ICD-10-CM | POA: Diagnosis present

## 2018-10-15 DIAGNOSIS — R569 Unspecified convulsions: Secondary | ICD-10-CM

## 2018-10-15 DIAGNOSIS — Z7982 Long term (current) use of aspirin: Secondary | ICD-10-CM | POA: Diagnosis not present

## 2018-10-15 DIAGNOSIS — Z955 Presence of coronary angioplasty implant and graft: Secondary | ICD-10-CM

## 2018-10-15 DIAGNOSIS — G40901 Epilepsy, unspecified, not intractable, with status epilepticus: Secondary | ICD-10-CM | POA: Diagnosis present

## 2018-10-15 DIAGNOSIS — Z7902 Long term (current) use of antithrombotics/antiplatelets: Secondary | ICD-10-CM | POA: Diagnosis not present

## 2018-10-15 DIAGNOSIS — Z7189 Other specified counseling: Secondary | ICD-10-CM

## 2018-10-15 DIAGNOSIS — R410 Disorientation, unspecified: Secondary | ICD-10-CM | POA: Diagnosis not present

## 2018-10-15 DIAGNOSIS — G8194 Hemiplegia, unspecified affecting left nondominant side: Secondary | ICD-10-CM | POA: Diagnosis present

## 2018-10-15 DIAGNOSIS — I639 Cerebral infarction, unspecified: Secondary | ICD-10-CM | POA: Diagnosis present

## 2018-10-15 DIAGNOSIS — J9601 Acute respiratory failure with hypoxia: Secondary | ICD-10-CM | POA: Diagnosis present

## 2018-10-15 DIAGNOSIS — K219 Gastro-esophageal reflux disease without esophagitis: Secondary | ICD-10-CM | POA: Diagnosis present

## 2018-10-15 DIAGNOSIS — M2550 Pain in unspecified joint: Secondary | ICD-10-CM | POA: Diagnosis not present

## 2018-10-15 DIAGNOSIS — Z87891 Personal history of nicotine dependence: Secondary | ICD-10-CM | POA: Diagnosis not present

## 2018-10-15 DIAGNOSIS — I63233 Cerebral infarction due to unspecified occlusion or stenosis of bilateral carotid arteries: Secondary | ICD-10-CM | POA: Diagnosis not present

## 2018-10-15 DIAGNOSIS — F0390 Unspecified dementia without behavioral disturbance: Secondary | ICD-10-CM | POA: Diagnosis present

## 2018-10-15 DIAGNOSIS — G40911 Epilepsy, unspecified, intractable, with status epilepticus: Secondary | ICD-10-CM | POA: Diagnosis not present

## 2018-10-15 DIAGNOSIS — G459 Transient cerebral ischemic attack, unspecified: Secondary | ICD-10-CM | POA: Diagnosis not present

## 2018-10-15 DIAGNOSIS — I110 Hypertensive heart disease with heart failure: Secondary | ICD-10-CM | POA: Diagnosis present

## 2018-10-15 DIAGNOSIS — Z7401 Bed confinement status: Secondary | ICD-10-CM | POA: Diagnosis not present

## 2018-10-15 DIAGNOSIS — F0150 Vascular dementia without behavioral disturbance: Secondary | ICD-10-CM | POA: Diagnosis not present

## 2018-10-15 DIAGNOSIS — I361 Nonrheumatic tricuspid (valve) insufficiency: Secondary | ICD-10-CM | POA: Diagnosis not present

## 2018-10-15 DIAGNOSIS — R404 Transient alteration of awareness: Secondary | ICD-10-CM | POA: Diagnosis not present

## 2018-10-15 LAB — TSH: TSH: 0.6 u[IU]/mL (ref 0.350–4.500)

## 2018-10-15 LAB — COMPREHENSIVE METABOLIC PANEL
ALT: 20 U/L (ref 0–44)
AST: 36 U/L (ref 15–41)
Albumin: 4.3 g/dL (ref 3.5–5.0)
Alkaline Phosphatase: 94 U/L (ref 38–126)
Anion gap: 14 (ref 5–15)
BUN: 12 mg/dL (ref 8–23)
CO2: 26 mmol/L (ref 22–32)
Calcium: 9.9 mg/dL (ref 8.9–10.3)
Chloride: 98 mmol/L (ref 98–111)
Creatinine, Ser: 1.12 mg/dL — ABNORMAL HIGH (ref 0.44–1.00)
GFR calc Af Amer: 50 mL/min — ABNORMAL LOW (ref >60)
GFR calc non Af Amer: 44 mL/min — ABNORMAL LOW (ref >60)
Glucose, Bld: 150 mg/dL — ABNORMAL HIGH (ref 70–99)
Potassium: 3.3 mmol/L — ABNORMAL LOW (ref 3.5–5.1)
Sodium: 138 mmol/L (ref 135–145)
Total Bilirubin: 1.2 mg/dL (ref 0.3–1.2)
Total Protein: 7.5 g/dL (ref 6.5–8.1)

## 2018-10-15 LAB — POCT I-STAT EG7
Acid-Base Excess: 1 mmol/L (ref 0.0–2.0)
Bicarbonate: 26.2 mmol/L (ref 20.0–28.0)
Calcium, Ion: 1.14 mmol/L — ABNORMAL LOW (ref 1.15–1.40)
HCT: 46 % (ref 36.0–46.0)
Hemoglobin: 15.6 g/dL — ABNORMAL HIGH (ref 12.0–15.0)
O2 Saturation: 80 %
Potassium: 3.1 mmol/L — ABNORMAL LOW (ref 3.5–5.1)
Sodium: 138 mmol/L (ref 135–145)
TCO2: 28 mmol/L (ref 22–32)
pCO2, Ven: 43.1 mmHg — ABNORMAL LOW (ref 44.0–60.0)
pH, Ven: 7.392 (ref 7.250–7.430)
pO2, Ven: 45 mmHg (ref 32.0–45.0)

## 2018-10-15 LAB — I-STAT CHEM 8, ED
BUN: 14 mg/dL (ref 8–23)
Calcium, Ion: 1.12 mmol/L — ABNORMAL LOW (ref 1.15–1.40)
Chloride: 100 mmol/L (ref 98–111)
Creatinine, Ser: 1 mg/dL (ref 0.44–1.00)
Glucose, Bld: 152 mg/dL — ABNORMAL HIGH (ref 70–99)
HCT: 45 % (ref 36.0–46.0)
Hemoglobin: 15.3 g/dL — ABNORMAL HIGH (ref 12.0–15.0)
Potassium: 3.2 mmol/L — ABNORMAL LOW (ref 3.5–5.1)
Sodium: 137 mmol/L (ref 135–145)
TCO2: 25 mmol/L (ref 22–32)

## 2018-10-15 LAB — LACTIC ACID, PLASMA
Lactic Acid, Venous: 1 mmol/L (ref 0.5–1.9)
Lactic Acid, Venous: <0.3 mmol/L — ABNORMAL LOW (ref 0.5–1.9)

## 2018-10-15 LAB — DIFFERENTIAL
Abs Immature Granulocytes: 0.08 10*3/uL — ABNORMAL HIGH (ref 0.00–0.07)
Basophils Absolute: 0 10*3/uL (ref 0.0–0.1)
Basophils Relative: 0 %
Eosinophils Absolute: 0 10*3/uL (ref 0.0–0.5)
Eosinophils Relative: 0 %
Immature Granulocytes: 1 %
Lymphocytes Relative: 5 %
Lymphs Abs: 0.7 10*3/uL (ref 0.7–4.0)
Monocytes Absolute: 0.5 10*3/uL (ref 0.1–1.0)
Monocytes Relative: 4 %
Neutro Abs: 13.4 10*3/uL — ABNORMAL HIGH (ref 1.7–7.7)
Neutrophils Relative %: 90 %

## 2018-10-15 LAB — CBC
HCT: 44.4 % (ref 36.0–46.0)
Hemoglobin: 14.8 g/dL (ref 12.0–15.0)
MCH: 29.1 pg (ref 26.0–34.0)
MCHC: 33.3 g/dL (ref 30.0–36.0)
MCV: 87.2 fL (ref 80.0–100.0)
Platelets: 332 10*3/uL (ref 150–400)
RBC: 5.09 MIL/uL (ref 3.87–5.11)
RDW: 12.6 % (ref 11.5–15.5)
WBC: 14.8 10*3/uL — ABNORMAL HIGH (ref 4.0–10.5)
nRBC: 0 % (ref 0.0–0.2)

## 2018-10-15 LAB — CBG MONITORING, ED: Glucose-Capillary: 143 mg/dL — ABNORMAL HIGH (ref 70–99)

## 2018-10-15 LAB — PROTIME-INR
INR: 0.9 (ref 0.8–1.2)
Prothrombin Time: 12.5 seconds (ref 11.4–15.2)

## 2018-10-15 LAB — APTT: aPTT: 29 seconds (ref 24–36)

## 2018-10-15 LAB — MAGNESIUM: Magnesium: 1.9 mg/dL (ref 1.7–2.4)

## 2018-10-15 LAB — SARS CORONAVIRUS 2 BY RT PCR (HOSPITAL ORDER, PERFORMED IN ~~LOC~~ HOSPITAL LAB): SARS Coronavirus 2: NEGATIVE

## 2018-10-15 LAB — GLUCOSE, CAPILLARY: Glucose-Capillary: 143 mg/dL — ABNORMAL HIGH (ref 70–99)

## 2018-10-15 MED ORDER — SENNOSIDES-DOCUSATE SODIUM 8.6-50 MG PO TABS: 1.0000 | ORAL_TABLET | Freq: Every evening | ORAL | Status: DC | PRN

## 2018-10-15 MED ORDER — STROKE: EARLY STAGES OF RECOVERY BOOK
Freq: Once | Status: AC
  Administered 2018-10-15: 15:00:00

## 2018-10-15 MED ORDER — IOHEXOL 350 MG/ML SOLN
100.0000 mL | Freq: Once | INTRAVENOUS | Status: AC | PRN
  Administered 2018-10-15: 100 mL via INTRAVENOUS

## 2018-10-15 MED ORDER — ACETAMINOPHEN 325 MG PO TABS: 650.0000 mg | ORAL_TABLET | ORAL | Status: DC | PRN

## 2018-10-15 MED ORDER — POTASSIUM CHLORIDE 10 MEQ/100ML IV SOLN
10.0000 meq | INTRAVENOUS | Status: AC
  Administered 2018-10-15 (×4): 10 meq via INTRAVENOUS
  Filled 2018-10-15 (×4): qty 100

## 2018-10-15 MED ORDER — PIPERACILLIN-TAZOBACTAM 3.375 G IVPB 30 MIN
3.3750 g | Freq: Once | INTRAVENOUS | Status: AC
  Administered 2018-10-15: 3.375 g via INTRAVENOUS
  Filled 2018-10-15: qty 50

## 2018-10-15 MED ORDER — METOPROLOL TARTRATE 5 MG/5ML IV SOLN
2.5000 mg | Freq: Four times a day (QID) | INTRAVENOUS | Status: DC | PRN
  Filled 2018-10-15: qty 5

## 2018-10-15 MED ORDER — SODIUM CHLORIDE 0.9% FLUSH: 3.0000 mL | Freq: Once | INTRAVENOUS | Status: DC

## 2018-10-15 MED ORDER — ENOXAPARIN SODIUM 60 MG/0.6ML ~~LOC~~ SOLN
1.0000 mg/kg | SUBCUTANEOUS | Status: DC
  Administered 2018-10-15 – 2018-10-17 (×3): 55 mg via SUBCUTANEOUS
  Filled 2018-10-15 (×3): qty 0.55

## 2018-10-15 MED ORDER — IOHEXOL 350 MG/ML SOLN: 100.0000 mL | Freq: Once | INTRAVENOUS | Status: DC | PRN

## 2018-10-15 MED ORDER — ASPIRIN 300 MG RE SUPP
300.0000 mg | Freq: Every day | RECTAL | Status: DC
  Administered 2018-10-15 – 2018-10-17 (×3): 300 mg via RECTAL
  Filled 2018-10-15 (×3): qty 1

## 2018-10-15 MED ORDER — IOHEXOL 350 MG/ML SOLN
50.0000 mL | Freq: Once | INTRAVENOUS | Status: AC | PRN
  Administered 2018-10-15: 50 mL via INTRAVENOUS

## 2018-10-15 MED ORDER — SODIUM CHLORIDE 0.9 % IV SOLN
INTRAVENOUS | Status: DC
  Administered 2018-10-15 – 2018-10-18 (×3): via INTRAVENOUS

## 2018-10-15 MED ORDER — ACETAMINOPHEN 160 MG/5ML PO SOLN: 650.0000 mg | ORAL | Status: DC | PRN

## 2018-10-15 MED ORDER — METOPROLOL TARTRATE 5 MG/5ML IV SOLN
2.5000 mg | Freq: Once | INTRAVENOUS | Status: AC
  Administered 2018-10-15: 2.5 mg via INTRAVENOUS
  Filled 2018-10-15: qty 5

## 2018-10-15 MED ORDER — SODIUM CHLORIDE 0.9 % IV BOLUS
1000.0000 mL | Freq: Once | INTRAVENOUS | Status: AC
  Administered 2018-10-15: 1000 mL via INTRAVENOUS

## 2018-10-15 MED ORDER — ACETAMINOPHEN 650 MG RE SUPP: 650.0000 mg | RECTAL | Status: DC | PRN

## 2018-10-15 MED ORDER — PANTOPRAZOLE SODIUM 40 MG IV SOLR
40.0000 mg | INTRAVENOUS | Status: DC
  Administered 2018-10-15 – 2018-10-17 (×3): 40 mg via INTRAVENOUS
  Filled 2018-10-15 (×3): qty 40

## 2018-10-15 NOTE — H&P (Addendum)
History and Physical    [name] VPX:[id number] DOB: 1927/04/06 DOA: 10/15/2018  Referring MD/NP/PA: Duffy Bruce, MD PCP: Mayra Neer, MD  Patient coming from: Home via EMS  Chief Complaint: Weakness  I have personally briefly reviewed patient's old medical records in [place]   HPI: [name] is a 85 y.o. female with medical history significant of A. fib not on anticoagulation, systolic CHF, CAD s/p CABG, dementia, DM type II, PVD; who presented after being found by son this morning around 8 AM in bed unable to talk and with left-sided weakness.  History is obtained from the son over the phone as patient unable to give history at this time due to her condition.  She had last been seen normal around 6 PM the night before.  At baseline the patient had been living alone, but her son will check on her twice daily making sure she had meals and her medications.  When he arrived this morning he also noted that she had vomited.  He reports that she had previously been on Coumadin, but he does not recall why she was taken off of it.   ED Course: Upon admission to the emergency department patient was seen to have a temperature of 100.3 F pulse 76-123, respiration 15-22, blood pressure elevated up to 194/87, and O2 saturation maintained on 2 L of nasal cannula oxygen.  Labs revealed WBC 14.8, potassium 3.1, BUN 12, creatinine 1.12, and lactic acid 1.  Chest x-ray showed no acute abnormalities.  Patient had been given Zosyn as there was initial concern for aspiration and 2.5 mg of metoprolol IV for rate control.  Neurology have been consulted.  TRH called to admit.  Review of Systems  Unable to perform ROS: Medical condition  Gastrointestinal: Positive for vomiting.  Neurological: Positive for speech change and focal weakness.    Past Medical History:  Diagnosis Date   Aortic valve disorder    Chronic diastolic heart failure (HCC)    Constipation    Coronary  artery disease    Dementia    Diabetes mellitus without complication (HCC)    GERD (gastroesophageal reflux disease)    Hyperlipidemia    Hypertensive heart disease without CHF    Peripheral neuropathy    Peripheral vascular disease ([place])     Past Surgical History:  Procedure Laterality Date   BREAST ENHANCEMENT SURGERY     CAROTID ENDARTERECTOMY  12/2001   CORONARY ARTERY BYPASS GRAFT  1981, [id number]   By Dr. Camillo Flaming   LAPAROSCOPIC CHOLECYSTECTOMY  2001   Repair of left iliac psuedoaneurysm following cath  2006   Repair of lymphocoele     TONSILLECTOMY       reports that she has quit smoking. She has never used smokeless tobacco. She reports that she does not drink alcohol or use drugs.  Allergies  Allergen Reactions   Clindamycin/Lincomycin Rash and Other (See Comments)    Heart problems     Family History  Problem Relation Age of Onset   Coronary artery disease Brother    Coronary artery disease Brother    Coronary artery disease Brother     Prior to Admission medications   Medication Sig Start Date End Date Taking? Authorizing Provider  amLODipine (NORVASC) 5 MG tablet Take 1 tablet (5 mg total) by mouth 2 (two) times daily after a meal. 03/06/16  Yes Sheikh, Omair Latif, DO  aspirin EC 81 MG EC tablet Take 1 tablet (81 mg total) by mouth  daily. 03/29/16  Yes Eliseo Squires, Jessica U, DO  atorvastatin (LIPITOR) 20 MG tablet Take 1 tablet (20 mg total) by mouth daily at 6 PM. 03/06/16  Yes Sheikh, Tool, DO  feeding supplement, ENSURE ENLIVE, (ENSURE ENLIVE) LIQD Take 237 mLs by mouth 2 (two) times daily between meals. Patient taking differently: Take 237 mLs by mouth daily.  03/06/16  Yes Sheikh, Omair Latif, DO  furosemide (LASIX) 20 MG tablet Take 20 mg by mouth daily. 07/24/18  Yes [provider]  hydrALAZINE (APRESOLINE) 50 MG tablet Take by mouth.   Yes [provider]  isosorbide mononitrate (IMDUR) 60 MG 24 hr tablet Take  180 mg by mouth daily.    Yes [provider]  memantine (NAMENDA) 10 MG tablet Take 10 mg by mouth 2 (two) times daily.    Yes [provider]  lidocaine (LIDODERM) 5 % Place 1 patch onto the skin daily. Remove & Discard patch within 12 hours or as directed by MD Patient not taking: Reported on 10/15/2018 03/28/16   Geradine Girt, DO  metoprolol tartrate (LOPRESSOR) 25 MG tablet Take 1 tablet (25 mg total) by mouth 2 (two) times daily. Patient not taking: Reported on 10/15/2018 03/06/16   Raiford Noble Latif, DO  pantoprazole (PROTONIX) 20 MG tablet Take 1 tablet (20 mg total) by mouth daily. Patient not taking: Reported on 10/15/2018 03/29/16   Geradine Girt, DO  polyethylene glycol (MIRALAX / GLYCOLAX) packet Take 17 g by mouth daily. Patient not taking: Reported on 10/15/2018 03/28/16   Geradine Girt, DO  potassium chloride SA (K-DUR,KLOR-CON) 20 MEQ tablet Take 1 tablet (20 mEq total) by mouth daily. Patient not taking: Reported on 10/15/2018 04/23/16   Daleen Bo, MD    Physical Exam:  Constitutional: Elderly female who appears to be in no acute distress Vitals:   10/15/18 1145 10/15/18 1200 10/15/18 1230 10/15/18 1300  BP: (!) 170/82 (!) 179/74 (!) 166/121   Pulse: (!) 112 (!) 102 100   Resp: (!) 22 18 20    Temp:      TempSrc:      SpO2: 100% 96% 100%   Weight:    56.2 kg  Height:    5\' 2"  (1.575 m)   Eyes: PERRL, lids and conjunctivae normal ENMT: Mucous membranes are dry.  Posterior pharynx clear of any exudate or lesions.   Neck: normal, supple, no masses, no thyromegaly Respiratory: clear to auscultation bilaterally, no wheezing, no crackles. Normal respiratory effort. No accessory muscle use.  Cardiovascular: Irregularly irregular, no murmurs / rubs / gallops. No extremity edema. 2+ pedal pulses. No carotid bruits.  Abdomen: no tenderness, no masses palpated. No hepatosplenomegaly. Bowel sounds positive.  Musculoskeletal: no clubbing / cyanosis. No  joint deformity upper and lower extremities. Good ROM, no contractures. Normal muscle tone.  Skin: no rashes, lesions, ulcers. No induration Neurologic: Strength 5/5 in right upper and lower extremity.  Strength in left upper extremity 3/5, and not moving left lower extremity. Psychiatric: Alert, but unable to assess for orientation due to current condition.    Labs on Admission: I have personally reviewed following labs and imaging studies  CBC: Recent Labs  Lab 10/15/18 0903 10/15/18 0913 10/15/18 1029  WBC 14.8*  --   --   NEUTROABS 13.4*  --   --   HGB 14.8 15.3* 15.6*  HCT 44.4 45.0 46.0  MCV 87.2  --   --   PLT 332  --   --  Basic Metabolic Panel: Recent Labs  Lab 10/15/18 0903 10/15/18 0913 10/15/18 1029 10/15/18 1437  NA 138 137 138  --   K 3.3* 3.2* 3.1*  --   CL 98 100  --   --   CO2 26  --   --   --   GLUCOSE 150* 152*  --   --   BUN 12 14  --   --   CREATININE 1.12* 1.00  --   --   CALCIUM 9.9  --   --   --   MG  --   --   --  1.9   GFR: Estimated Creatinine Clearance: 30.2 mL/min (by C-G formula based on SCr of 1 mg/dL). Liver Function Tests: Recent Labs  Lab 10/15/18 0903  AST 36  ALT 20  ALKPHOS 94  BILITOT 1.2  PROT 7.5  ALBUMIN 4.3   No results for input(s): LIPASE, AMYLASE in the last 168 hours. No results for input(s): AMMONIA in the last 168 hours. Coagulation Profile: Recent Labs  Lab 10/15/18 0903  INR 0.9   Cardiac Enzymes: No results for input(s): CKTOTAL, CKMB, CKMBINDEX, TROPONINI in the last 168 hours. BNP (last 3 results) No results for input(s): PROBNP in the last 8760 hours. HbA1C: No results for input(s): HGBA1C in the last 72 hours. CBG: Recent Labs  Lab 10/15/18 0905  GLUCAP 143*   Lipid Profile: No results for input(s): CHOL, HDL, LDLCALC, TRIG, CHOLHDL, LDLDIRECT in the last 72 hours. Thyroid Function Tests: Recent Labs    10/15/18 1437  TSH 0.600   Anemia Panel: No results for input(s): VITAMINB12,  FOLATE, FERRITIN, TIBC, IRON, RETICCTPCT in the last 72 hours. Urine analysis:    Component Value Date/Time   COLORURINE YELLOW 01/31/2016 [place] 01/31/2016 1233   LABSPEC 1.015 01/31/2016 1233   PHURINE 6.5 01/31/2016 1233   GLUCOSEU NEGATIVE 01/31/2016 1233   HGBUR NEGATIVE 01/31/2016 1233   BILIRUBINUR NEGATIVE 01/31/2016 1233   KETONESUR NEGATIVE 01/31/2016 1233   PROTEINUR NEGATIVE 01/31/2016 1233   UROBILINOGEN 1.0 05/23/2012 0257   NITRITE NEGATIVE 01/31/2016 1233   LEUKOCYTESUR NEGATIVE 01/31/2016 1233   Sepsis Labs: Recent Results (from the past 240 hour(s))  SARS Coronavirus 2 (CEPHEID- Performed in [place] hospital lab), Hosp Order     Status: None   Collection Time: 10/15/18  9:42 AM  Result Value Ref Range Status   SARS Coronavirus 2 NEGATIVE NEGATIVE Final    Comment: (NOTE) If result is NEGATIVE SARS-CoV-2 target nucleic acids are NOT DETECTED. The SARS-CoV-2 RNA is generally detectable in upper and lower  respiratory specimens during the acute phase of infection. The lowest  concentration of SARS-CoV-2 viral copies this assay can detect is 250  copies / mL. A negative result does not preclude SARS-CoV-2 infection  and should not be used as the sole basis for treatment or other  patient management decisions.  A negative result may occur with  improper specimen collection / handling, submission of specimen other  than nasopharyngeal swab, presence of viral mutation(s) within the  areas targeted by this assay, and inadequate number of viral copies  (<250 copies / mL). A negative result must be combined with clinical  observations, patient history, and epidemiological information. If result is POSITIVE SARS-CoV-2 target nucleic acids are DETECTED. The SARS-CoV-2 RNA is generally detectable in upper and lower  respiratory specimens dur ing the acute phase of infection.  Positive  results are indicative of active infection with SARS-CoV-2.  Clinical  correlation with patient history and other diagnostic information is  necessary to determine patient infection status.  Positive results do  not rule out bacterial infection or co-infection with other viruses. If result is PRESUMPTIVE POSTIVE SARS-CoV-2 nucleic acids MAY BE PRESENT.   A presumptive positive result was obtained on the submitted specimen  and confirmed on repeat testing.  While 2019 novel coronavirus  (SARS-CoV-2) nucleic acids may be present in the submitted sample  additional confirmatory testing may be necessary for epidemiological  and / or clinical management purposes  to differentiate between  SARS-CoV-2 and other Sarbecovirus currently known to infect humans.  If clinically indicated additional testing with an alternate test  methodology [phone]) is advised. The SARS-CoV-2 RNA is generally  detectable in upper and lower respiratory sp ecimens during the acute  phase of infection. The expected result is Negative. Fact Sheet for Patients:  StrictlyIdeas.no Fact Sheet for Healthcare Providers: BankingDealers.co.za This test is not yet approved or cleared by the [place] FDA and has been authorized for detection and/or diagnosis of SARS-CoV-2 by FDA under an Emergency Use Authorization (EUA).  This EUA will remain in effect (meaning this test can be used) for the duration of the COVID-19 declaration under Section 564(b)(1) of the Act, 21 U.S.C. section 360bbb-3(b)(1), unless the authorization is terminated or revoked sooner. Performed at [facility], [address], [place], [place] [address]      Radiological Exams on Admission: Ct Code Stroke Cta Head W/wo Contrast  Result Date: 10/15/2018 CLINICAL DATA:  Code stroke. Last seen normal at 6 o'clock p.m. last night. Aphasia. Left-sided weakness. EXAM: CT ANGIOGRAPHY HEAD AND NECK CT PERFUSION BRAIN TECHNIQUE: Multidetector CT imaging of the head  and neck was performed using the standard protocol during bolus administration of intravenous contrast. Multiplanar CT image reconstructions and MIPs were obtained to evaluate the vascular anatomy. Carotid stenosis measurements (when applicable) are obtained utilizing NASCET criteria, using the distal internal carotid diameter as the denominator. Multiphase CT imaging of the brain was performed following IV bolus contrast injection. Subsequent parametric perfusion maps were calculated using RAPID software. CONTRAST:  170mL OMNIPAQUE IOHEXOL 350 MG/ML SOLN COMPARISON:  CT head without contrast 10/15/2018. FINDINGS: CTA NECK FINDINGS Aortic arch: Extensive vascular calcifications are present at the aortic arch and origins of the great vessels. A 3 vessel arch configuration is present. There dense calcifications at the origin of the left common carotid artery with greater than 60% stenosis proximally. There is no significant stenosis at the origin of the innominate or of the left subclavian artery. There is no aneurysm. Right carotid system: Right common carotid artery demonstrates some proximal calcifications from the innominate. There distal mural calcifications proximal bifurcation. Extensive calcified plaque is present at the right carotid bifurcation without a significant stenosis of greater than 50% relative to the more distal vessel. The more distal right ICA is normal. Left carotid system: The left common carotid artery demonstrates the proximal stenosis. Additional mural calcifications are present without other tandem stenoses. Extensive vascular calcifications are present at the left carotid bifurcation without a significant stenosis of greater than 50% relative to the more distal vessel. The more distal left ICA is normal. Vertebral arteries: The vertebral arteries are codominant. Calcifications are present in the subclavian arteries bilaterally without significant vertebral artery stenoses. There is no  significant vertebral artery stenosis or vascular injury in the neck. Skeleton: Endplate degenerative changes and uncovertebral spurring is worse right than left at C5-6 and C6-7.  Extensive facet changes are also worse on the right, more in the upper cervical spine. No focal lytic or blastic lesions are present. The patient is status post median sternotomy. Vertebral body heights are maintained. Calvarium is intact. Other neck: No focal mucosal or submucosal lesions are present. Thyroid and salivary glands are normal. There is no significant adenopathy. Upper chest: Centrilobular emphysematous changes are present. There is no focal nodule, mass, or airspace disease. Review of the MIP images confirms the above findings CTA HEAD FINDINGS Anterior circulation: Posterior circulation: Atherosclerotic calcifications are present within the cavernous internal carotid arteries bilaterally. There is no significant stenosis of either vessel relative to the ICA terminus. The A1 and M1 segments are normal. The anterior communicating artery is patent. MCA bifurcations are intact bilaterally. There is asymmetric attenuation of distal left MCA branch vessels compared to the right. No significant proximal stenosis or occlusion is present. Mild distal irregularity is present in the ACA branches bilaterally. Venous sinuses: Atherosclerotic calcifications are present at the dural margin of both vertebral arteries. There is a moderate left V3 segment stenosis. Vertebrobasilar junction is normal. PICA origins are visualized and normal. Basilar artery is normal. Both posterior cerebral arteries originate from the basilar tip. There is some attenuation of distal PCA branch vessels without significant proximal stenosis or occlusion. Anatomic variants: None Delayed phase: Review of the MIP images confirms the above findings CT Brain Perfusion Findings: ASPECTS: 10/10 CBF (<30%) Volume: 3mL Perfusion (Tmax>6.0s) volume: 63mL Mismatch Volume:  44mL Infarction Location:There is asymmetric ischemia and watershed distribution over the left convexity. No infarct is present. IMPRESSION: 1. Asymmetric decreased perfusion to the left hemisphere evident by decreased cerebral blood flow in a watershed distribution and decreased collateral vessels visible on the CTA. 2. The most significant proximal stenosis on the left is in the proximal left common carotid artery near the origin. There is a stenosis of at least 60%. 3. Extensive atherosclerotic changes are present at the left carotid bifurcation and left cavernous internal carotid artery without a significant stenosis of greater than 50%. 4. No large vessel occlusion. 5. Moderate left vertebral artery stenosis at the V3 segment. 6. Other significant atherosclerotic calcifications as described without other significant stenoses. 7. Moderate degenerative changes in the cervical spine. These results were called by telephone at the time of interpretation on 10/15/2018 at 9:54 am to Dr. Kerney Elbe , who verbally acknowledged these results. Electronically Signed   By: San Morelle M.D.   On: 10/15/2018 10:03   Ct Code Stroke Cta Neck W/wo Contrast  Result Date: 10/15/2018 CLINICAL DATA:  Code stroke. Last seen normal at 6 o'clock p.m. last night. Aphasia. Left-sided weakness. EXAM: CT ANGIOGRAPHY HEAD AND NECK CT PERFUSION BRAIN TECHNIQUE: Multidetector CT imaging of the head and neck was performed using the standard protocol during bolus administration of intravenous contrast. Multiplanar CT image reconstructions and MIPs were obtained to evaluate the vascular anatomy. Carotid stenosis measurements (when applicable) are obtained utilizing NASCET criteria, using the distal internal carotid diameter as the denominator. Multiphase CT imaging of the brain was performed following IV bolus contrast injection. Subsequent parametric perfusion maps were calculated using RAPID software. CONTRAST:  161mL OMNIPAQUE  IOHEXOL 350 MG/ML SOLN COMPARISON:  CT head without contrast 10/15/2018. FINDINGS: CTA NECK FINDINGS Aortic arch: Extensive vascular calcifications are present at the aortic arch and origins of the great vessels. A 3 vessel arch configuration is present. There dense calcifications at the origin of the left common carotid artery with greater than  60% stenosis proximally. There is no significant stenosis at the origin of the innominate or of the left subclavian artery. There is no aneurysm. Right carotid system: Right common carotid artery demonstrates some proximal calcifications from the innominate. There distal mural calcifications proximal bifurcation. Extensive calcified plaque is present at the right carotid bifurcation without a significant stenosis of greater than 50% relative to the more distal vessel. The more distal right ICA is normal. Left carotid system: The left common carotid artery demonstrates the proximal stenosis. Additional mural calcifications are present without other tandem stenoses. Extensive vascular calcifications are present at the left carotid bifurcation without a significant stenosis of greater than 50% relative to the more distal vessel. The more distal left ICA is normal. Vertebral arteries: The vertebral arteries are codominant. Calcifications are present in the subclavian arteries bilaterally without significant vertebral artery stenoses. There is no significant vertebral artery stenosis or vascular injury in the neck. Skeleton: Endplate degenerative changes and uncovertebral spurring is worse right than left at C5-6 and C6-7. Extensive facet changes are also worse on the right, more in the upper cervical spine. No focal lytic or blastic lesions are present. The patient is status post median sternotomy. Vertebral body heights are maintained. Calvarium is intact. Other neck: No focal mucosal or submucosal lesions are present. Thyroid and salivary glands are normal. There is no  significant adenopathy. Upper chest: Centrilobular emphysematous changes are present. There is no focal nodule, mass, or airspace disease. Review of the MIP images confirms the above findings CTA HEAD FINDINGS Anterior circulation: Posterior circulation: Atherosclerotic calcifications are present within the cavernous internal carotid arteries bilaterally. There is no significant stenosis of either vessel relative to the ICA terminus. The A1 and M1 segments are normal. The anterior communicating artery is patent. MCA bifurcations are intact bilaterally. There is asymmetric attenuation of distal left MCA branch vessels compared to the right. No significant proximal stenosis or occlusion is present. Mild distal irregularity is present in the ACA branches bilaterally. Venous sinuses: Atherosclerotic calcifications are present at the dural margin of both vertebral arteries. There is a moderate left V3 segment stenosis. Vertebrobasilar junction is normal. PICA origins are visualized and normal. Basilar artery is normal. Both posterior cerebral arteries originate from the basilar tip. There is some attenuation of distal PCA branch vessels without significant proximal stenosis or occlusion. Anatomic variants: None Delayed phase: Review of the MIP images confirms the above findings CT Brain Perfusion Findings: ASPECTS: 10/10 CBF (<30%) Volume: 42mL Perfusion (Tmax>6.0s) volume: 44mL Mismatch Volume: 59mL Infarction Location:There is asymmetric ischemia and watershed distribution over the left convexity. No infarct is present. IMPRESSION: 1. Asymmetric decreased perfusion to the left hemisphere evident by decreased cerebral blood flow in a watershed distribution and decreased collateral vessels visible on the CTA. 2. The most significant proximal stenosis on the left is in the proximal left common carotid artery near the origin. There is a stenosis of at least 60%. 3. Extensive atherosclerotic changes are present at the left  carotid bifurcation and left cavernous internal carotid artery without a significant stenosis of greater than 50%. 4. No large vessel occlusion. 5. Moderate left vertebral artery stenosis at the V3 segment. 6. Other significant atherosclerotic calcifications as described without other significant stenoses. 7. Moderate degenerative changes in the cervical spine. These results were called by telephone at the time of interpretation on 10/15/2018 at 9:54 am to Dr. Kerney Elbe , who verbally acknowledged these results. Electronically Signed   By: Wynetta Fines.D.  On: 10/15/2018 10:03   Ct Angio Chest Pe W And/or Wo Contrast  Result Date: 10/15/2018 CLINICAL DATA:  Respiratory distress. EXAM: CT ANGIOGRAPHY CHEST WITH CONTRAST TECHNIQUE: Multidetector CT imaging of the chest was performed using the standard protocol during bolus administration of intravenous contrast. Multiplanar CT image reconstructions and MIPs were obtained to evaluate the vascular anatomy. CONTRAST:  47mL OMNIPAQUE IOHEXOL 350 MG/ML SOLN COMPARISON:  Radiograph of same day.  CT scan of January 06, 2009. FINDINGS: Cardiovascular: Satisfactory opacification of the pulmonary arteries to the segmental level. No evidence of pulmonary embolism. Normal heart size. No pericardial effusion. Status post coronary bypass graft. Atherosclerosis of thoracic aorta is noted. Mediastinum/Nodes: Moderate size sliding-type hiatal hernia is noted. No adenopathy is noted. Thyroid gland is unremarkable. Lungs/Pleura: Lungs are clear. No pleural effusion or pneumothorax. Upper Abdomen: No acute abnormality. Musculoskeletal: No chest wall abnormality. No acute or significant osseous findings. Review of the MIP images confirms the above findings. IMPRESSION: No definite evidence of pulmonary embolus. Moderate size sliding-type hiatal hernia. Aortic Atherosclerosis (ICD10-I70.0). Electronically Signed   By: Marijo Conception M.D.   On: 10/15/2018 13:25   Ct Code  Stroke Cerebral Perfusion With Contrast  Result Date: 10/15/2018 CLINICAL DATA:  Code stroke. Last seen normal at 6 o'clock p.m. last night. Aphasia. Left-sided weakness. EXAM: CT ANGIOGRAPHY HEAD AND NECK CT PERFUSION BRAIN TECHNIQUE: Multidetector CT imaging of the head and neck was performed using the standard protocol during bolus administration of intravenous contrast. Multiplanar CT image reconstructions and MIPs were obtained to evaluate the vascular anatomy. Carotid stenosis measurements (when applicable) are obtained utilizing NASCET criteria, using the distal internal carotid diameter as the denominator. Multiphase CT imaging of the brain was performed following IV bolus contrast injection. Subsequent parametric perfusion maps were calculated using RAPID software. CONTRAST:  147mL OMNIPAQUE IOHEXOL 350 MG/ML SOLN COMPARISON:  CT head without contrast 10/15/2018. FINDINGS: CTA NECK FINDINGS Aortic arch: Extensive vascular calcifications are present at the aortic arch and origins of the great vessels. A 3 vessel arch configuration is present. There dense calcifications at the origin of the left common carotid artery with greater than 60% stenosis proximally. There is no significant stenosis at the origin of the innominate or of the left subclavian artery. There is no aneurysm. Right carotid system: Right common carotid artery demonstrates some proximal calcifications from the innominate. There distal mural calcifications proximal bifurcation. Extensive calcified plaque is present at the right carotid bifurcation without a significant stenosis of greater than 50% relative to the more distal vessel. The more distal right ICA is normal. Left carotid system: The left common carotid artery demonstrates the proximal stenosis. Additional mural calcifications are present without other tandem stenoses. Extensive vascular calcifications are present at the left carotid bifurcation without a significant stenosis of  greater than 50% relative to the more distal vessel. The more distal left ICA is normal. Vertebral arteries: The vertebral arteries are codominant. Calcifications are present in the subclavian arteries bilaterally without significant vertebral artery stenoses. There is no significant vertebral artery stenosis or vascular injury in the neck. Skeleton: Endplate degenerative changes and uncovertebral spurring is worse right than left at C5-6 and C6-7. Extensive facet changes are also worse on the right, more in the upper cervical spine. No focal lytic or blastic lesions are present. The patient is status post median sternotomy. Vertebral body heights are maintained. Calvarium is intact. Other neck: No focal mucosal or submucosal lesions are present. Thyroid and salivary glands are normal. There  is no significant adenopathy. Upper chest: Centrilobular emphysematous changes are present. There is no focal nodule, mass, or airspace disease. Review of the MIP images confirms the above findings CTA HEAD FINDINGS Anterior circulation: Posterior circulation: Atherosclerotic calcifications are present within the cavernous internal carotid arteries bilaterally. There is no significant stenosis of either vessel relative to the ICA terminus. The A1 and M1 segments are normal. The anterior communicating artery is patent. MCA bifurcations are intact bilaterally. There is asymmetric attenuation of distal left MCA branch vessels compared to the right. No significant proximal stenosis or occlusion is present. Mild distal irregularity is present in the ACA branches bilaterally. Venous sinuses: Atherosclerotic calcifications are present at the dural margin of both vertebral arteries. There is a moderate left V3 segment stenosis. Vertebrobasilar junction is normal. PICA origins are visualized and normal. Basilar artery is normal. Both posterior cerebral arteries originate from the basilar tip. There is some attenuation of distal PCA  branch vessels without significant proximal stenosis or occlusion. Anatomic variants: None Delayed phase: Review of the MIP images confirms the above findings CT Brain Perfusion Findings: ASPECTS: 10/10 CBF (<30%) Volume: 60mL Perfusion (Tmax>6.0s) volume: 59mL Mismatch Volume: 54mL Infarction Location:There is asymmetric ischemia and watershed distribution over the left convexity. No infarct is present. IMPRESSION: 1. Asymmetric decreased perfusion to the left hemisphere evident by decreased cerebral blood flow in a watershed distribution and decreased collateral vessels visible on the CTA. 2. The most significant proximal stenosis on the left is in the proximal left common carotid artery near the origin. There is a stenosis of at least 60%. 3. Extensive atherosclerotic changes are present at the left carotid bifurcation and left cavernous internal carotid artery without a significant stenosis of greater than 50%. 4. No large vessel occlusion. 5. Moderate left vertebral artery stenosis at the V3 segment. 6. Other significant atherosclerotic calcifications as described without other significant stenoses. 7. Moderate degenerative changes in the cervical spine. These results were called by telephone at the time of interpretation on 10/15/2018 at 9:54 am to Dr. Kerney Elbe , who verbally acknowledged these results. Electronically Signed   By: San Morelle M.D.   On: 10/15/2018 10:03   Dg Chest Portable 1 View  Result Date: 10/15/2018 CLINICAL DATA:  Left-sided weakness vomiting.  Possible aspiration. EXAM: PORTABLE CHEST 1 VIEW COMPARISON:  Radiographs 05/05/2016 and 04/24/2016 FINDINGS: 1032 hours. The heart size and mediastinal contours are stable status post median sternotomy and CABG. There is aortic atherosclerosis. The lungs are clear. There is no pleural effusion or pneumothorax. No acute osseous findings are evident. Prominent bilateral breast capsular calcifications are noted. Telemetry leads  overlie the chest. IMPRESSION: Stable chest.  No active cardiopulmonary process. Electronically Signed   By: Richardean Sale M.D.   On: 10/15/2018 10:45   Ct Head Code Stroke Wo Contrast  Result Date: 10/15/2018 CLINICAL DATA:  Code stroke.  Left-sided paralysis and aphasia. EXAM: CT HEAD WITHOUT CONTRAST TECHNIQUE: Contiguous axial images were obtained from the base of the skull through the vertex without intravenous contrast. 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 Chronic small-vessel ischemic changes of the pons. Generalized age related atrophy. Chronic small-vessel ischemic changes of the hemispheric white matter. No sign of acute cortical infarction. No mass lesion, hemorrhage, hydrocephalus or extra-axial collection. Vascular: There is atherosclerotic calcification of the major vessels at the base of the brain. Skull: Negative Sinuses/Orbits: Clear/normal Other: None ASPECTS ([place] Stroke Program Early CT Score) - Ganglionic level infarction (caudate,  lentiform nuclei, internal capsule, insula, M1-M3 cortex): 7 - Supraganglionic infarction (M4-M6 cortex): 3 Total score (0-10 with 10 being normal): 10 IMPRESSION: 1. No acute finding by CT. Atrophy and chronic small-vessel ischemic changes. 2. ASPECTS is 10. 3. These results were communicated to Dr. Cheral Marker at [facility] 5/26/2020by text page via the [facility] messaging system. Electronically Signed   By: Nelson Chimes M.D.   On: 10/15/2018 09:23    EKG: Independently reviewed.  Atrial fibrillation 111 bpm  Assessment/Plan CVA: Acute.  Patient presented with complaints of left-sided weakness and rightward gaze.  Imaging studies suggested decreased perfusion of the left cerebral hemisphere and watershed distribution.  Neurology consulted. - Admit to telemetry bed - Stroke order set initiated - Neuro checks - Check  MRI brain - PT/OT/Speech to evaluate and treat - Check echocardiogram - Check  Hemoglobin A1c and lipid panel in a.m. - Aspirin suppository - Permissive hypertension - Restart Plavix when able - Appreciate neurology consultative services, will follow-up for further recommendation - Social work consult   Acute respiratory failure with hypoxia: Patient presented with new oxygen requirement of 2 L nasal cannula oxygen to maintain O2 saturations.  Chest x-ray otherwise clear.  Question possible pulmonary embolus, but CT angiogram of the chest was negative for a PE or signs of pneumonia.  Patient had been given Zosyn x1 dose for suspected aspiration. -Continuous pulse oximetry with nasal cannula oxygen to maintain O2 saturations -Incentive spirometry  Paroxysmal atrial fibrillation: Heart rates on admission into the 120s.  Given 2.5 mg of metoprolol IV with improvement in rate.  CHA2DS2-VASc score = 8(CHF, HTN, DM, CVA, age, sex). Remote history of being on anticoagulation, but appears to have been discontinued due to dementia, previous report of fall, and social situation of living alone in 02/2016. -Check TSH  -Lovenox per pharmacy -Metoprolol IV as needed for rate control   Hypokalemia: Acute.  Potassium noted to be as low as 3.1. -Give 40 mEq of potassium chloride IV -Continue to monitor and replace as needed -Check magnesium  Systolic CHF: Chronic.  Last EF noted to be 45 to 50% in 02/2016.  Patient does not appear to be fluid overloaded at this time. -Follow-up echo  Diabetes mellitus type 2: On admission glucose 151.  Patient appears to be diet-controlled at baseline.  No recent hemoglobin A1c on file. -Follow-up hemoglobin A1c -Continue to monitor for now  History of CAD with CABG x4 and left main stent  GERD -Protonix IV   DVT prophylaxis: Lovenox Code Status: DNR Family Communication: Discussed plan of care with the son over the phone. Disposition Plan: To be determined Consults called: Neurology  Admission status:inpatient   Norval Morton  MD [facility] Pager [phone]   If 7PM-7AM, please contact night-coverage [email or web address] Password [facility]  10/15/2018, 4:52 PM

## 2018-10-15 NOTE — ED Notes (Signed)
Patient transported to CT 

## 2018-10-15 NOTE — ED Triage Notes (Signed)
Pt here from home, LSN 1800 last night. Pt found by son this morning aphasic, L sided weakness. Surrounded by vomit in bed, potential aspiration. 100.3 temporal. At baseline, pt ambulatory, mild dementia but able to have conversation.

## 2018-10-15 NOTE — Evaluation (Signed)
Clinical/Bedside Swallow Evaluation Patient Details  Name: [name] MRN: [id number] Date of Birth: [birth year]  Today's Date: 10/15/2018 Time: SLP Start Time (ACUTE ONLY): 1505 SLP Stop Time (ACUTE ONLY): 1530 SLP Time Calculation (min) (ACUTE ONLY): 25 min  Past Medical History:  Past Medical History:  Diagnosis Date  . Aortic valve disorder   . Chronic diastolic heart failure ([place])   . Constipation   . Coronary artery disease   . Dementia   . Diabetes mellitus without complication ([place])   . GERD (gastroesophageal reflux disease)   . Hyperlipidemia   . Hypertensive heart disease without CHF   . Peripheral neuropathy   . Peripheral vascular disease [facility])    Past Surgical History:  Past Surgical History:  Procedure Laterality Date  . BREAST ENHANCEMENT SURGERY    . CAROTID ENDARTERECTOMY  12/2001  . [place], [id number]   By Dr. Camillo Flaming  . LAPAROSCOPIC CHOLECYSTECTOMY  2001  . Repair of left iliac psuedoaneurysm following cath  2006  . Repair of lymphocoele    . TONSILLECTOMY     HPI:  Patient is a 85 y/o female who presents with left sided weakness and aphasia. Concern for possible aspiration PNA. Head CT-Asymmetric decreased perfusion to the left hemisphere evident by decreased cerebral blood flow in a watershed distribution. MRI pending. PMH includes CAD s/p CABG, HTN, HLD, PVD, dementia, CKD   Assessment / Plan / Recommendation Clinical Impression  Patient presents with a severe cognitive-based dysphagia, resulting in delayed swallow initiation, poor awareness of boluses, immediate and delayed coughing and decreased acceptance of boluses. Patient with h/o dementia and now a new CVA, both of which are contributing to her inability to safely consume PO's.  SLP Visit Diagnosis: Dysphagia, oropharyngeal phase (R13.12)    Aspiration Risk  Moderate aspiration risk;Risk for inadequate nutrition/hydration    Diet Recommendation NPO    Medication Administration: Via alternative means    Other  Recommendations Oral Care Recommendations: Oral care QID(if patient allows)   Follow up Recommendations Skilled Nursing facility;24 hour supervision/assistance      Frequency and Duration min 3x week  2 weeks       Prognosis Prognosis for Safe Diet Advancement: Fair Barriers to Reach Goals: Cognitive deficits;Severity of deficits      Swallow Study   General Date of Onset: 10/15/18 HPI: Patient is a 85 y/o female who presents with left sided weakness and aphasia. Concern for possible aspiration PNA. Head CT-Asymmetric decreased perfusion to the left hemisphere evident by decreased cerebral blood flow in a watershed distribution. MRI pending. PMH includes CAD s/p CABG, HTN, HLD, PVD, dementia, CKD Type of Study: Bedside Swallow Evaluation Previous Swallow Assessment: N/A Diet Prior to this Study: NPO Temperature Spikes Noted: No Respiratory Status: Nasal cannula History of Recent Intubation: No Behavior/Cognition: Alert;Confused;Doesn't follow directions;Requires cueing;Uncooperative Oral Cavity Assessment: Other (comment)(patient would not allow for oral motor assessment) Oral Care Completed by SLP: Other (Comment)(attempted but patient very resistant) Oral Cavity - Dentition: Other (Comment)(unable to fully visualize) Self-Feeding Abilities: Total assist Patient Positioning: Upright in bed Baseline Vocal Quality: Not observed Volitional Cough: Cognitively unable to elicit Volitional Swallow: Unable to elicit    Oral/Motor/Sensory Function Overall Oral Motor/Sensory Function: Mild impairment Facial ROM: Reduced left Facial Symmetry: Abnormal symmetry right Facial Strength: Reduced left   Ice Chips     Thin Liquid Thin Liquid: Impaired Presentation: Cup;Straw Oral Phase Impairments: Poor awareness of bolus Pharyngeal  Phase Impairments: Suspected delayed Swallow;Cough - Immediate;Cough -  Delayed    Nectar  Thick Nectar Thick Liquid: Impaired Presentation: Cup Pharyngeal Phase Impairments: Suspected delayed Swallow   Honey Thick Honey Thick Liquid: Not tested   Puree Puree: Impaired Presentation: Spoon Oral Phase Impairments: Reduced labial seal Oral Phase Functional Implications: Prolonged oral transit Pharyngeal Phase Impairments: Suspected delayed Swallow Other Comments: patient accepted only tiny amount of spoon bolus and started to refuse the rest   Solid     Solid: Not tested      Dannial Monarch 10/15/2018,5:54 PM  Sonia Baller, MA, CCC-SLP Speech Therapy [facility] Acute Rehab Pager: [phone]

## 2018-10-15 NOTE — Consult Note (Signed)
NEURO HOSPITALIST CONSULT NOTE   Requestig physician: Dr. Ellender Hose  Reason for Consult: Acute onset of left sided weakness and aphasia  History obtained from:  EMS and Chart  HPI:                                                                                                                                          [name] is an 85 y.o. female with dementia presenting from home with acute onset of left hemiparesis and aphasia. LKN was 1800 last night. She was found by her son this AM with aphasia and left sided weakness, surrounded by vomit in her bed. She was thought to possibly have aspirated. Her temporal temp per EMS was 100.3. CBG 151, BP 178/94, HR 120, 90% on RA en route.   She is partially dependent on her family, who visit her every day and assist with some ADLs such as cooking. At baseline she able to have a conversation. She is able to ambulate on her own. She is not on a blood thinner.   Her son [name], can be contacted at [phone].  Stroke risk factors: CHF, CAD (history of CABG), DM, HLD, HTN, PVD, carotid artery disease (history of CEA).   Past Medical History:  Diagnosis Date  . Aortic valve disorder   . Chronic diastolic heart failure ([place])   . Constipation   . Coronary artery disease   . Dementia   . Diabetes mellitus without complication ([place])   . GERD (gastroesophageal reflux disease)   . Hyperlipidemia   . Hypertensive heart disease without CHF   . Peripheral neuropathy   . Peripheral vascular disease [facility])     Past Surgical History:  Procedure Laterality Date  . BREAST ENHANCEMENT SURGERY    . CAROTID ENDARTERECTOMY  12/2001  . [place], [id number]   By Dr. Camillo Flaming  . LAPAROSCOPIC CHOLECYSTECTOMY  2001  . Repair of left iliac psuedoaneurysm following cath  2006  . Repair of lymphocoele    . TONSILLECTOMY      Family History  Problem Relation Age of Onset  . Coronary artery disease  Brother   . Coronary artery disease Brother   . Coronary artery disease Brother               Social History:  reports that she has quit smoking. She has never used smokeless tobacco. She reports that she does not drink alcohol or use drugs.  Allergies  Allergen Reactions  . Clindamycin/Lincomycin Rash and Other (See Comments)    Heart problems     HOME MEDICATIONS:  Norvasc ASA Plavix Lipitor Zetia Ensure Hydralazine Imdur Lidocaine Hyzaar Memantine Lopressor Protonix Miralax K-Dur    ROS:                                                                                                                                       Unable to obtain due to aphasia.   Blood pressure (!) 184/89, pulse 76, temperature 98.4 F (36.9 C), temperature source Axillary, resp. rate 16, SpO2 99 %.   General Examination:                                                                                                       Physical Exam  HEENT-  [place]/AT   Lungs- Respirations unlabored  Extremities- Warm and well perfused  Neurological Examination Mental Status: Awake with dense receptive and expressive aphasia. Mute and not following any commands. Attends to her left and right sides. Cranial Nerves: II: No blink to threat on the left. PERRL.   III,IV, VI: No ptosis. Right sided gaze preference, but will track examiner's face briefly to the left. No nystagmus.  V,VII: Subtle decreased prominence of left NL fold. Grimaces to brow ridge pressure.  VIII: Unable to formally test hearing.  IX,X: Does not vocalize or open mouth for examination of palate XI: Head rotates to left and right spontaneously XII: Does not protrude tongue to command Motor: RUE and RLE: Moves purposefully with 5/5 strength LUE with extensor posturing at elbow and internal rotation, increased tone. No  purposeful movement.  LLE 2/5 withdrawal to noxious Sensory: Reacts to noxious stimuli on left and right Deep Tendon Reflexes: No definite asymmetry of upper extremity reflexes. Hypoactive patellar reflexes.  Plantars: Upgoing bilaterally Cerebellar: Not following commands for assessment Gait: Deferred   Lab Results: Basic Metabolic Panel: No results for input(s): NA, K, CL, CO2, GLUCOSE, BUN, CREATININE, CALCIUM, MG, PHOS in the last 168 hours.  CBC: No results for input(s): WBC, NEUTROABS, HGB, HCT, MCV, PLT in the last 168 hours.  Cardiac Enzymes: No results for input(s): CKTOTAL, CKMB, CKMBINDEX, TROPONINI in the last 168 hours.  Lipid Panel: No results for input(s): CHOL, TRIG, HDL, CHOLHDL, VLDL, LDLCALC in the last 168 hours.  Imaging: No results found.   Assessment: 85 year old female with acute onset of left sided weakness and aphasia 1. Neurological exam best localizes as focal right cerebral hemisphere dysfunction 2. No hemorrhage on CT head 3. No LVO on CTA. Proximal stenoses noted on imaging of the  neck. 4. CTP is of equivocal significance, with possible watershed distribution hypoperfusion  5. Stroke risk factors: CHF, CAD (history of CABG), DM, HLD, HTN, PVD, carotid artery disease (history of CEA).   Recommendations: 1. Permissive HTN 2. IVF with NS is recommended to maintain BP. Use with caution given her history of diastolic heart failure.  3. Continue home ASA and Plavix 4. Unclear if Lipitor with continued benefit in this patient given her advanced age 41. MRI brain 6. TTE 7. Cardiac telemetry 8. PT/OT/Speech 9. Permissive HTN x 24 hours   Electronically signed: Dr. Kerney Elbe 10/15/2018, 9:14 AM

## 2018-10-15 NOTE — ED Notes (Signed)
ED TO INPATIENT HANDOFF REPORT  ED Nurse Name and Phone #: Judson Roch [phone]   S Name/Age/Gender [name] 85 y.o. female Room/Bed: 046C/046C  Code Status   Code Status: Full Code  Home/SNF/Other Home {Patient oriented to: disoriented x 4 Is this baseline? No   Triage Complete: Triage complete  Chief Complaint code stroke  Triage Note Pt here from home, LSN 1800 last night. Pt found by son this morning aphasic, L sided weakness. Surrounded by vomit in bed, potential aspiration. 100.3 temporal. At baseline, pt ambulatory, mild dementia but able to have conversation.    Allergies Allergies  Allergen Reactions  . Clindamycin/Lincomycin Rash and Other (See Comments)    Heart problems     Level of Care/Admitting Diagnosis ED Disposition    ED Disposition Condition Comment   Admit  Hospital Area: [facility] [[id number]]  Level of Care: Telemetry Medical [104]  Covid Evaluation: Screening Protocol (No Symptoms)  Diagnosis: CVA (cerebral vascular accident) [facility]) [[id number]]  Admitting Physician: Norval Morton [[id number]]  Attending Physician: Norval Morton [[id number]]  Estimated length of stay: past midnight tomorrow  Certification:: I certify this patient will need inpatient services for at least 2 midnights  PT Class (Do Not Modify): Inpatient [101]  PT Acc Code (Do Not Modify): Private [1]       B Medical/Surgery History Past Medical History:  Diagnosis Date  . Aortic valve disorder   . Chronic diastolic heart failure ([place])   . Constipation   . Coronary artery disease   . Dementia   . Diabetes mellitus without complication ([place])   . GERD (gastroesophageal reflux disease)   . Hyperlipidemia   . Hypertensive heart disease without CHF   . Peripheral neuropathy   . Peripheral vascular disease [facility])    Past Surgical History:  Procedure Laterality Date  . BREAST ENHANCEMENT SURGERY    . CAROTID ENDARTERECTOMY  12/2001  . [place], [id number]   By Dr. Camillo Flaming  . LAPAROSCOPIC CHOLECYSTECTOMY  2001  . Repair of left iliac psuedoaneurysm following cath  2006  . Repair of lymphocoele    . TONSILLECTOMY       A IV Location/Drains/Wounds Patient Lines/Drains/Airways Status   Active Line/Drains/Airways    Name:   Placement date:   Placement time:   Site:   Days:   Peripheral IV 10/15/18 Left Antecubital   10/15/18    0908    Antecubital   less than 1   Peripheral IV 10/15/18 Left;Upper Arm   10/15/18    0916    Arm   less than 1          Intake/Output Last 24 hours  Intake/Output Summary (Last 24 hours) at 10/15/2018 1258 Last data filed at 10/15/2018 1112 Gross per 24 hour  Intake 100 ml  Output -  Net 100 ml    Labs/Imaging Results for orders placed or performed during the hospital encounter of 10/15/18 (from the past 48 hour(s))  Protime-INR     Status: None   Collection Time: 10/15/18  9:03 AM  Result Value Ref Range   Prothrombin Time 12.5 11.4 - 15.2 seconds   INR 0.9 0.8 - 1.2    Comment: (NOTE) INR goal varies based on device and disease states. Performed at [facility], [place] [address] [place], [place] [address]   APTT     Status: None   Collection Time: 10/15/18  9:03 AM  Result Value Ref  Range   aPTT 29 24 - 36 seconds    Comment: Performed at [facility] [address], [place], [place] [address]  CBC     Status: Abnormal   Collection Time: 10/15/18  9:03 AM  Result Value Ref Range   WBC 14.8 (H) 4.0 - 10.5 K/uL   RBC 5.09 3.87 - 5.11 MIL/uL   Hemoglobin 14.8 12.0 - 15.0 g/dL   HCT 44.4 36.0 - 46.0 %   MCV 87.2 80.0 - 100.0 fL   MCH 29.1 26.0 - 34.0 pg   MCHC 33.3 30.0 - 36.0 g/dL   RDW 12.6 11.5 - 15.5 %   Platelets 332 150 - 400 K/uL   nRBC 0.0 0.0 - 0.2 %    Comment: Performed at [facility], [place] [address] [place], [place] [address]  Differential     Status: Abnormal   Collection Time: 10/15/18  9:03 AM  Result Value Ref  Range   Neutrophils Relative % 90 %   Neutro Abs 13.4 (H) 1.7 - 7.7 K/uL   Lymphocytes Relative 5 %   Lymphs Abs 0.7 0.7 - 4.0 K/uL   Monocytes Relative 4 %   Monocytes Absolute 0.5 0.1 - 1.0 K/uL   Eosinophils Relative 0 %   Eosinophils Absolute 0.0 0.0 - 0.5 K/uL   Basophils Relative 0 %   Basophils Absolute 0.0 0.0 - 0.1 K/uL   Immature Granulocytes 1 %   Abs Immature Granulocytes 0.08 (H) 0.00 - 0.07 K/uL    Comment: Performed at [facility] [address] [place], [place] [address]  Comprehensive metabolic panel     Status: Abnormal   Collection Time: 10/15/18  9:03 AM  Result Value Ref Range   Sodium 138 135 - 145 mmol/L   Potassium 3.3 (L) 3.5 - 5.1 mmol/L   Chloride 98 98 - 111 mmol/L   CO2 26 22 - 32 mmol/L   Glucose, Bld 150 (H) 70 - 99 mg/dL   BUN 12 8 - 23 mg/dL   Creatinine, Ser 1.12 (H) 0.44 - 1.00 mg/dL   Calcium 9.9 8.9 - 10.3 mg/dL   Total Protein 7.5 6.5 - 8.1 g/dL   Albumin 4.3 3.5 - 5.0 g/dL   AST 36 15 - 41 U/L   ALT 20 0 - 44 U/L   Alkaline Phosphatase 94 38 - 126 U/L   Total Bilirubin 1.2 0.3 - 1.2 mg/dL   GFR calc non Af Amer 44 (L) >60 mL/min   GFR calc Af Amer 50 (L) >60 mL/min   Anion gap 14 5 - 15    Comment: Performed at [facility] [address] [place], [place] [address]  CBG monitoring, ED     Status: Abnormal   Collection Time: 10/15/18  9:05 AM  Result Value Ref Range   Glucose-Capillary 143 (H) 70 - 99 mg/dL  Lactic acid, plasma     Status: None   Collection Time: 10/15/18  9:11 AM  Result Value Ref Range   Lactic Acid, Venous 1.0 0.5 - 1.9 mmol/L    Comment: Performed at [facility] [address] [place], [place] [address]  I-stat chem 8, ED [facility] and [facility] only)     Status: Abnormal   Collection Time: 10/15/18  9:13 AM  Result Value Ref Range   Sodium 137 135 - 145 mmol/L   Potassium 3.2 (L) 3.5 - 5.1 mmol/L   Chloride 100 98 -  111 mmol/L   BUN 14 8 - 23 mg/dL   Creatinine, Ser 1.00 0.44 - 1.00 mg/dL    Glucose, Bld 152 (H) 70 - 99 mg/dL   Calcium, Ion 1.12 (L) 1.15 - 1.40 mmol/L   TCO2 25 22 - 32 mmol/L   Hemoglobin 15.3 (H) 12.0 - 15.0 g/dL   HCT 45.0 36.0 - 46.0 %  SARS Coronavirus 2 (CEPHEID- Performed in [facility] hospital lab), Hosp Order     Status: None   Collection Time: 10/15/18  9:42 AM  Result Value Ref Range   SARS Coronavirus 2 NEGATIVE NEGATIVE    Comment: (NOTE) If result is NEGATIVE SARS-CoV-2 target nucleic acids are NOT DETECTED. The SARS-CoV-2 RNA is generally detectable in upper and lower  respiratory specimens during the acute phase of infection. The lowest  concentration of SARS-CoV-2 viral copies this assay can detect is 250  copies / mL. A negative result does not preclude SARS-CoV-2 infection  and should not be used as the sole basis for treatment or other  patient management decisions.  A negative result may occur with  improper specimen collection / handling, submission of specimen other  than nasopharyngeal swab, presence of viral mutation(s) within the  areas targeted by this assay, and inadequate number of viral copies  (<250 copies / mL). A negative result must be combined with clinical  observations, patient history, and epidemiological information. If result is POSITIVE SARS-CoV-2 target nucleic acids are DETECTED. The SARS-CoV-2 RNA is generally detectable in upper and lower  respiratory specimens dur ing the acute phase of infection.  Positive  results are indicative of active infection with SARS-CoV-2.  Clinical  correlation with patient history and other diagnostic information is  necessary to determine patient infection status.  Positive results do  not rule out bacterial infection or co-infection with other viruses. If result is PRESUMPTIVE POSTIVE SARS-CoV-2 nucleic acids MAY BE PRESENT.   A presumptive positive result was obtained on the submitted specimen  and confirmed on repeat testing.  While 2019 novel coronavirus  (SARS-CoV-2)  nucleic acids may be present in the submitted sample  additional confirmatory testing may be necessary for epidemiological  and / or clinical management purposes  to differentiate between  SARS-CoV-2 and other Sarbecovirus currently known to infect humans.  If clinically indicated additional testing with an alternate test  methodology [phone]) is advised. The SARS-CoV-2 RNA is generally  detectable in upper and lower respiratory sp ecimens during the acute  phase of infection. The expected result is Negative. Fact Sheet for Patients:  StrictlyIdeas.no Fact Sheet for Healthcare Providers: BankingDealers.co.za This test is not yet approved or cleared by the [place] FDA and has been authorized for detection and/or diagnosis of SARS-CoV-2 by FDA under an Emergency Use Authorization (EUA).  This EUA will remain in effect (meaning this test can be used) for the duration of the COVID-19 declaration under Section 564(b)(1) of the Act, 21 U.S.C. section 360bbb-3(b)(1), unless the authorization is terminated or revoked sooner. Performed at [facility], [place] [address], [place], [place] [address]   POCT I-Stat EG7     Status: Abnormal   Collection Time: 10/15/18 10:29 AM  Result Value Ref Range   pH, Ven 7.392 7.250 - 7.430   pCO2, Ven 43.1 (L) 44.0 - 60.0 mmHg   pO2, Ven 45.0 32.0 - 45.0 mmHg   Bicarbonate 26.2 20.0 - 28.0 mmol/L   TCO2 28 22 - 32 mmol/L   O2 Saturation 80.0 %  Acid-Base Excess 1.0 0.0 - 2.0 mmol/L   Sodium 138 135 - 145 mmol/L   Potassium 3.1 (L) 3.5 - 5.1 mmol/L   Calcium, Ion 1.14 (L) 1.15 - 1.40 mmol/L   HCT 46.0 36.0 - 46.0 %   Hemoglobin 15.6 (H) 12.0 - 15.0 g/dL   Patient temperature HIDE    Sample type VENOUS    Ct Code Stroke Cta Head W/wo Contrast  Result Date: 10/15/2018 CLINICAL DATA:  Code stroke. Last seen normal at 6 o'clock p.m. last night. Aphasia. Left-sided weakness. EXAM: CT ANGIOGRAPHY  HEAD AND NECK CT PERFUSION BRAIN TECHNIQUE: Multidetector CT imaging of the head and neck was performed using the standard protocol during bolus administration of intravenous contrast. Multiplanar CT image reconstructions and MIPs were obtained to evaluate the vascular anatomy. Carotid stenosis measurements (when applicable) are obtained utilizing NASCET criteria, using the distal internal carotid diameter as the denominator. Multiphase CT imaging of the brain was performed following IV bolus contrast injection. Subsequent parametric perfusion maps were calculated using RAPID software. CONTRAST:  150mL OMNIPAQUE IOHEXOL 350 MG/ML SOLN COMPARISON:  CT head without contrast 10/15/2018. FINDINGS: CTA NECK FINDINGS Aortic arch: Extensive vascular calcifications are present at the aortic arch and origins of the great vessels. A 3 vessel arch configuration is present. There dense calcifications at the origin of the left common carotid artery with greater than 60% stenosis proximally. There is no significant stenosis at the origin of the innominate or of the left subclavian artery. There is no aneurysm. Right carotid system: Right common carotid artery demonstrates some proximal calcifications from the innominate. There distal mural calcifications proximal bifurcation. Extensive calcified plaque is present at the right carotid bifurcation without a significant stenosis of greater than 50% relative to the more distal vessel. The more distal right ICA is normal. Left carotid system: The left common carotid artery demonstrates the proximal stenosis. Additional mural calcifications are present without other tandem stenoses. Extensive vascular calcifications are present at the left carotid bifurcation without a significant stenosis of greater than 50% relative to the more distal vessel. The more distal left ICA is normal. Vertebral arteries: The vertebral arteries are codominant. Calcifications are present in the subclavian  arteries bilaterally without significant vertebral artery stenoses. There is no significant vertebral artery stenosis or vascular injury in the neck. Skeleton: Endplate degenerative changes and uncovertebral spurring is worse right than left at C5-6 and C6-7. Extensive facet changes are also worse on the right, more in the upper cervical spine. No focal lytic or blastic lesions are present. The patient is status post median sternotomy. Vertebral body heights are maintained. Calvarium is intact. Other neck: No focal mucosal or submucosal lesions are present. Thyroid and salivary glands are normal. There is no significant adenopathy. Upper chest: Centrilobular emphysematous changes are present. There is no focal nodule, mass, or airspace disease. Review of the MIP images confirms the above findings CTA HEAD FINDINGS Anterior circulation: Posterior circulation: Atherosclerotic calcifications are present within the cavernous internal carotid arteries bilaterally. There is no significant stenosis of either vessel relative to the ICA terminus. The A1 and M1 segments are normal. The anterior communicating artery is patent. MCA bifurcations are intact bilaterally. There is asymmetric attenuation of distal left MCA branch vessels compared to the right. No significant proximal stenosis or occlusion is present. Mild distal irregularity is present in the ACA branches bilaterally. Venous sinuses: Atherosclerotic calcifications are present at the dural margin of both vertebral arteries. There is a moderate left V3 segment stenosis.  Vertebrobasilar junction is normal. PICA origins are visualized and normal. Basilar artery is normal. Both posterior cerebral arteries originate from the basilar tip. There is some attenuation of distal PCA branch vessels without significant proximal stenosis or occlusion. Anatomic variants: None Delayed phase: Review of the MIP images confirms the above findings CT Brain Perfusion Findings: ASPECTS:  10/10 CBF (<30%) Volume: 91mL Perfusion (Tmax>6.0s) volume: 26mL Mismatch Volume: 19mL Infarction Location:There is asymmetric ischemia and watershed distribution over the left convexity. No infarct is present. IMPRESSION: 1. Asymmetric decreased perfusion to the left hemisphere evident by decreased cerebral blood flow in a watershed distribution and decreased collateral vessels visible on the CTA. 2. The most significant proximal stenosis on the left is in the proximal left common carotid artery near the origin. There is a stenosis of at least 60%. 3. Extensive atherosclerotic changes are present at the left carotid bifurcation and left cavernous internal carotid artery without a significant stenosis of greater than 50%. 4. No large vessel occlusion. 5. Moderate left vertebral artery stenosis at the V3 segment. 6. Other significant atherosclerotic calcifications as described without other significant stenoses. 7. Moderate degenerative changes in the cervical spine. These results were called by telephone at the time of interpretation on 10/15/2018 at 9:54 am to Dr. Kerney Elbe , who verbally acknowledged these results. Electronically Signed   By: San Morelle M.D.   On: 10/15/2018 10:03   Ct Code Stroke Cta Neck W/wo Contrast  Result Date: 10/15/2018 CLINICAL DATA:  Code stroke. Last seen normal at 6 o'clock p.m. last night. Aphasia. Left-sided weakness. EXAM: CT ANGIOGRAPHY HEAD AND NECK CT PERFUSION BRAIN TECHNIQUE: Multidetector CT imaging of the head and neck was performed using the standard protocol during bolus administration of intravenous contrast. Multiplanar CT image reconstructions and MIPs were obtained to evaluate the vascular anatomy. Carotid stenosis measurements (when applicable) are obtained utilizing NASCET criteria, using the distal internal carotid diameter as the denominator. Multiphase CT imaging of the brain was performed following IV bolus contrast injection. Subsequent parametric  perfusion maps were calculated using RAPID software. CONTRAST:  152mL OMNIPAQUE IOHEXOL 350 MG/ML SOLN COMPARISON:  CT head without contrast 10/15/2018. FINDINGS: CTA NECK FINDINGS Aortic arch: Extensive vascular calcifications are present at the aortic arch and origins of the great vessels. A 3 vessel arch configuration is present. There dense calcifications at the origin of the left common carotid artery with greater than 60% stenosis proximally. There is no significant stenosis at the origin of the innominate or of the left subclavian artery. There is no aneurysm. Right carotid system: Right common carotid artery demonstrates some proximal calcifications from the innominate. There distal mural calcifications proximal bifurcation. Extensive calcified plaque is present at the right carotid bifurcation without a significant stenosis of greater than 50% relative to the more distal vessel. The more distal right ICA is normal. Left carotid system: The left common carotid artery demonstrates the proximal stenosis. Additional mural calcifications are present without other tandem stenoses. Extensive vascular calcifications are present at the left carotid bifurcation without a significant stenosis of greater than 50% relative to the more distal vessel. The more distal left ICA is normal. Vertebral arteries: The vertebral arteries are codominant. Calcifications are present in the subclavian arteries bilaterally without significant vertebral artery stenoses. There is no significant vertebral artery stenosis or vascular injury in the neck. Skeleton: Endplate degenerative changes and uncovertebral spurring is worse right than left at C5-6 and C6-7. Extensive facet changes are also worse on the right, more in  the upper cervical spine. No focal lytic or blastic lesions are present. The patient is status post median sternotomy. Vertebral body heights are maintained. Calvarium is intact. Other neck: No focal mucosal or submucosal  lesions are present. Thyroid and salivary glands are normal. There is no significant adenopathy. Upper chest: Centrilobular emphysematous changes are present. There is no focal nodule, mass, or airspace disease. Review of the MIP images confirms the above findings CTA HEAD FINDINGS Anterior circulation: Posterior circulation: Atherosclerotic calcifications are present within the cavernous internal carotid arteries bilaterally. There is no significant stenosis of either vessel relative to the ICA terminus. The A1 and M1 segments are normal. The anterior communicating artery is patent. MCA bifurcations are intact bilaterally. There is asymmetric attenuation of distal left MCA branch vessels compared to the right. No significant proximal stenosis or occlusion is present. Mild distal irregularity is present in the ACA branches bilaterally. Venous sinuses: Atherosclerotic calcifications are present at the dural margin of both vertebral arteries. There is a moderate left V3 segment stenosis. Vertebrobasilar junction is normal. PICA origins are visualized and normal. Basilar artery is normal. Both posterior cerebral arteries originate from the basilar tip. There is some attenuation of distal PCA branch vessels without significant proximal stenosis or occlusion. Anatomic variants: None Delayed phase: Review of the MIP images confirms the above findings CT Brain Perfusion Findings: ASPECTS: 10/10 CBF (<30%) Volume: 32mL Perfusion (Tmax>6.0s) volume: 39mL Mismatch Volume: 69mL Infarction Location:There is asymmetric ischemia and watershed distribution over the left convexity. No infarct is present. IMPRESSION: 1. Asymmetric decreased perfusion to the left hemisphere evident by decreased cerebral blood flow in a watershed distribution and decreased collateral vessels visible on the CTA. 2. The most significant proximal stenosis on the left is in the proximal left common carotid artery near the origin. There is a stenosis of at  least 60%. 3. Extensive atherosclerotic changes are present at the left carotid bifurcation and left cavernous internal carotid artery without a significant stenosis of greater than 50%. 4. No large vessel occlusion. 5. Moderate left vertebral artery stenosis at the V3 segment. 6. Other significant atherosclerotic calcifications as described without other significant stenoses. 7. Moderate degenerative changes in the cervical spine. These results were called by telephone at the time of interpretation on 10/15/2018 at 9:54 am to Dr. Kerney Elbe , who verbally acknowledged these results. Electronically Signed   By: San Morelle M.D.   On: 10/15/2018 10:03   Ct Code Stroke Cerebral Perfusion With Contrast  Result Date: 10/15/2018 CLINICAL DATA:  Code stroke. Last seen normal at 6 o'clock p.m. last night. Aphasia. Left-sided weakness. EXAM: CT ANGIOGRAPHY HEAD AND NECK CT PERFUSION BRAIN TECHNIQUE: Multidetector CT imaging of the head and neck was performed using the standard protocol during bolus administration of intravenous contrast. Multiplanar CT image reconstructions and MIPs were obtained to evaluate the vascular anatomy. Carotid stenosis measurements (when applicable) are obtained utilizing NASCET criteria, using the distal internal carotid diameter as the denominator. Multiphase CT imaging of the brain was performed following IV bolus contrast injection. Subsequent parametric perfusion maps were calculated using RAPID software. CONTRAST:  124mL OMNIPAQUE IOHEXOL 350 MG/ML SOLN COMPARISON:  CT head without contrast 10/15/2018. FINDINGS: CTA NECK FINDINGS Aortic arch: Extensive vascular calcifications are present at the aortic arch and origins of the great vessels. A 3 vessel arch configuration is present. There dense calcifications at the origin of the left common carotid artery with greater than 60% stenosis proximally. There is no significant stenosis at the origin  of the innominate or of the left  subclavian artery. There is no aneurysm. Right carotid system: Right common carotid artery demonstrates some proximal calcifications from the innominate. There distal mural calcifications proximal bifurcation. Extensive calcified plaque is present at the right carotid bifurcation without a significant stenosis of greater than 50% relative to the more distal vessel. The more distal right ICA is normal. Left carotid system: The left common carotid artery demonstrates the proximal stenosis. Additional mural calcifications are present without other tandem stenoses. Extensive vascular calcifications are present at the left carotid bifurcation without a significant stenosis of greater than 50% relative to the more distal vessel. The more distal left ICA is normal. Vertebral arteries: The vertebral arteries are codominant. Calcifications are present in the subclavian arteries bilaterally without significant vertebral artery stenoses. There is no significant vertebral artery stenosis or vascular injury in the neck. Skeleton: Endplate degenerative changes and uncovertebral spurring is worse right than left at C5-6 and C6-7. Extensive facet changes are also worse on the right, more in the upper cervical spine. No focal lytic or blastic lesions are present. The patient is status post median sternotomy. Vertebral body heights are maintained. Calvarium is intact. Other neck: No focal mucosal or submucosal lesions are present. Thyroid and salivary glands are normal. There is no significant adenopathy. Upper chest: Centrilobular emphysematous changes are present. There is no focal nodule, mass, or airspace disease. Review of the MIP images confirms the above findings CTA HEAD FINDINGS Anterior circulation: Posterior circulation: Atherosclerotic calcifications are present within the cavernous internal carotid arteries bilaterally. There is no significant stenosis of either vessel relative to the ICA terminus. The A1 and M1 segments  are normal. The anterior communicating artery is patent. MCA bifurcations are intact bilaterally. There is asymmetric attenuation of distal left MCA branch vessels compared to the right. No significant proximal stenosis or occlusion is present. Mild distal irregularity is present in the ACA branches bilaterally. Venous sinuses: Atherosclerotic calcifications are present at the dural margin of both vertebral arteries. There is a moderate left V3 segment stenosis. Vertebrobasilar junction is normal. PICA origins are visualized and normal. Basilar artery is normal. Both posterior cerebral arteries originate from the basilar tip. There is some attenuation of distal PCA branch vessels without significant proximal stenosis or occlusion. Anatomic variants: None Delayed phase: Review of the MIP images confirms the above findings CT Brain Perfusion Findings: ASPECTS: 10/10 CBF (<30%) Volume: 57mL Perfusion (Tmax>6.0s) volume: 80mL Mismatch Volume: 49mL Infarction Location:There is asymmetric ischemia and watershed distribution over the left convexity. No infarct is present. IMPRESSION: 1. Asymmetric decreased perfusion to the left hemisphere evident by decreased cerebral blood flow in a watershed distribution and decreased collateral vessels visible on the CTA. 2. The most significant proximal stenosis on the left is in the proximal left common carotid artery near the origin. There is a stenosis of at least 60%. 3. Extensive atherosclerotic changes are present at the left carotid bifurcation and left cavernous internal carotid artery without a significant stenosis of greater than 50%. 4. No large vessel occlusion. 5. Moderate left vertebral artery stenosis at the V3 segment. 6. Other significant atherosclerotic calcifications as described without other significant stenoses. 7. Moderate degenerative changes in the cervical spine. These results were called by telephone at the time of interpretation on 10/15/2018 at 9:54 am to  Dr. Kerney Elbe , who verbally acknowledged these results. Electronically Signed   By: San Morelle M.D.   On: 10/15/2018 10:03   Dg Chest Portable 1  View  Result Date: 10/15/2018 CLINICAL DATA:  Left-sided weakness vomiting.  Possible aspiration. EXAM: PORTABLE CHEST 1 VIEW COMPARISON:  Radiographs 05/05/2016 and 04/24/2016 FINDINGS: 1032 hours. The heart size and mediastinal contours are stable status post median sternotomy and CABG. There is aortic atherosclerosis. The lungs are clear. There is no pleural effusion or pneumothorax. No acute osseous findings are evident. Prominent bilateral breast capsular calcifications are noted. Telemetry leads overlie the chest. IMPRESSION: Stable chest.  No active cardiopulmonary process. Electronically Signed   By: Richardean Sale M.D.   On: 10/15/2018 10:45   Ct Head Code Stroke Wo Contrast  Result Date: 10/15/2018 CLINICAL DATA:  Code stroke.  Left-sided paralysis and aphasia. EXAM: CT HEAD WITHOUT CONTRAST TECHNIQUE: Contiguous axial images were obtained from the base of the skull through the vertex without intravenous contrast. 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 Chronic small-vessel ischemic changes of the pons. Generalized age related atrophy. Chronic small-vessel ischemic changes of the hemispheric white matter. No sign of acute cortical infarction. No mass lesion, hemorrhage, hydrocephalus or extra-axial collection. Vascular: There is atherosclerotic calcification of the major vessels at the base of the brain. Skull: Negative Sinuses/Orbits: Clear/normal Other: None ASPECTS ([place] Stroke Program Early CT Score) - Ganglionic level infarction (caudate, lentiform nuclei, internal capsule, insula, M1-M3 cortex): 7 - Supraganglionic infarction (M4-M6 cortex): 3 Total score (0-10 with 10 being normal): 10 IMPRESSION: 1. No acute finding by CT. Atrophy and chronic small-vessel ischemic changes. 2.  ASPECTS is 10. 3. These results were communicated to Dr. Cheral Marker at [facility] 5/26/2020by text page via the [facility] messaging system. Electronically Signed   By: Nelson Chimes M.D.   On: 10/15/2018 09:23    Pending Labs Unresulted Labs (From admission, onward)    Start     Ordered   10/16/18 0500  Hemoglobin A1c  Tomorrow morning,   R     10/15/18 1252   10/16/18 0500  Lipid panel  Tomorrow morning,   R    Comments:  Fasting    10/15/18 1252   10/15/18 1257  TSH  Add-on,   R     10/15/18 1256   10/15/18 1256  Magnesium  Once,   R     10/15/18 1256   10/15/18 1015  Blood culture (routine x 2)  BLOOD CULTURE X 2,   STAT     10/15/18 1014   10/15/18 0911  Lactic acid, plasma  Now then every 2 hours,   STAT     10/15/18 0911          Vitals/Pain Today's Vitals   10/15/18 1145 10/15/18 1200 10/15/18 1205 10/15/18 1230  BP: (!) 170/82 (!) 179/74  (!) 166/121  Pulse: (!) 112 (!) 102  100  Resp: (!) 22 18  20   Temp:      TempSrc:      SpO2: 100% 96%  100%  PainSc:   0-No pain     Isolation Precautions Droplet and Contact precautions  Medications Medications  sodium chloride flush (NS) 0.9 % injection 3 mL (3 mLs Intravenous Not Given 10/15/18 1209)   stroke: mapping our early stages of recovery book (has no administration in time range)  0.9 %  sodium chloride infusion (has no administration in time range)  acetaminophen (TYLENOL) tablet 650 mg (has no administration in time range)    Or  acetaminophen (TYLENOL) solution 650 mg (has no administration in time range)    Or  acetaminophen (  TYLENOL) suppository 650 mg (has no administration in time range)  senna-docusate (Senokot-S) tablet 1 tablet (has no administration in time range)  potassium chloride 10 mEq in 100 mL IVPB (has no administration in time range)  iohexol (OMNIPAQUE) 350 MG/ML injection 100 mL (100 mLs Intravenous Contrast Given 10/15/18 0934)  sodium chloride 0.9 % bolus 1,000 mL (1,000 mLs Intravenous New  Bag/Given 10/15/18 1043)  piperacillin-tazobactam (ZOSYN) IVPB 3.375 g (0 g Intravenous Stopped 10/15/18 1112)  metoprolol tartrate (LOPRESSOR) injection 2.5 mg (2.5 mg Intravenous Given 10/15/18 1145)    Mobility non-ambulatory High fall risk -walks at baseline, unable to walk for admission  Focused Assessments Neuro Assessment Handoff:  Swallow screen pass? No    NIH Stroke Scale ( + Modified Stroke Scale Criteria)  Interval: Initial Level of Consciousness (1a.)   : Alert, keenly responsive LOC Questions (1b. )   +: Answers neither question correctly LOC Commands (1c. )   + : Performs neither task correctly Best Gaze (2. )  +: Partial gaze palsy Visual (3. )  +: Complete hemianopia Facial Palsy (4. )    : Normal symmetrical movements Motor Arm, Left (5a. )   +: No effort against gravity Motor Arm, Right (5b. )   +: No effort against gravity Motor Leg, Left (6a. )   +: No effort against gravity Motor Leg, Right (6b. )   +: No effort against gravity Limb Ataxia (7. ): Absent Sensory (8. )   +: Mild-to-moderate sensory loss, patient feels pinprick is less sharp or is dull on the affected side, or there is a loss of superficial pain with pinprick, but patient is aware of being touched Best Language (9. )   +: Severe aphasia Dysarthria (10. ): Severe dysarthria, patient's speech is so slurred as to be unintelligible in the absence of or out of proportion to any dysphasia, or is mute/anarthric Extinction/Inattention (11.)   +: No Abnormality Modified SS Total  +: 22 Complete NIHSS TOTAL: 24 Last date known well: 10/14/18 Last time known well: 1800 Neuro Assessment: Exceptions to WDL Neuro Checks:   Initial (10/15/18 0930)  Last Documented NIHSS Modified Score: 22 (10/15/18 1157) Has TPA been given? No If patient is a Neuro Trauma and patient is going to OR before floor call report to [place] nurse: [phone] or [phone]     R Recommendations: See Admitting Provider  Note  Report given to:   Additional Notes:

## 2018-10-15 NOTE — Code Documentation (Signed)
85 yo female coming from home via [facility] with complaints of left sided weakness, being unable to talk, and respiratory distress. Pt was last known normal at 1800 yesterday and her son found her this morning and called EMS. Noted to have temp of 100.6 with EMS and potential COVID ruleout. Upon arrival, pt had partial right gaze, left sided arm and leg weakness, mixed aphasia, and left visual field cut. Temp upon arrival was 98.6. Pt taken to ED 26 for Handoff and then taken to CT 3. Pt was placed on the cardiac monitor and noted to have irregular rhythm 110-120s. CT Head negative for hemorrhage. CTA/CTP showed no LVO and patient is not and IR candidate. Taken back to room. Handoff given to [employer], Therapist, sports.

## 2018-10-15 NOTE — Evaluation (Signed)
Physical Therapy Evaluation Patient Details Name: [name] MRN: [id number] DOB: Dec 29, 1926 Today's Date: 10/15/2018   History of Present Illness  Patient is a 85 y/o female who presents with left sided weakness and aphasia. Concern for possible aspiration PNA. Head CT-Asymmetric decreased perfusion to the left hemisphere evident by decreased cerebral blood flow in a watershed distribution. MRI pending. PMH includes CAD s/p CABG, HTN, HLD, PVD, dementia, CKD.   Clinical Impression  Patient presents with left sided weakness, language deficits, pain, impaired cognition and impaired mobility s/p above. Performed bed level evaluation as pt will require 2 person assist to get to EOB. Pt lethargic. Not sure if pt was in pain as RN started potassium drip and pt with tears coming out of her eyes as well as drawing up her LUE and shaking. Able to follow 2 one step commands. No active movement noted in LLE. Seems to have some tone present in LUE however unsure as pt not able to follow commands consistently. Per notes, pt lived alone PTA and family checked on her daily assisting with IADLs. Would benefit from SNF to maximize independence and mobility prior to return home.     Follow Up Recommendations SNF;Supervision/Assistance - 24 hour;Supervision for mobility/OOB    Equipment Recommendations  Other (comment)(defer to next venue)    Recommendations for Other Services       Precautions / Restrictions Precautions Precautions: Fall Restrictions Weight Bearing Restrictions: No      Mobility  Bed Mobility Overal bed mobility: Needs Assistance Bed Mobility: Rolling Rolling: Max assist         General bed mobility comments: Max A to roll in bed with max cues. Pt seemed in pain. Will need 2 person assist to safely get to EOB.  Transfers                 General transfer comment: Deferred  Ambulation/Gait             General Gait Details: Deferred  Stairs            Wheelchair Mobility    Modified Rankin (Stroke Patients Only) Modified Rankin (Stroke Patients Only) Pre-Morbid Rankin Score: No significant disability Modified Rankin: Severe disability     Balance                                             Pertinent Vitals/Pain Pain Assessment: Faces Faces Pain Scale: Hurts whole lot Pain Location: unsure; question LUE hurting as nurse just started potassium drip= pt shaking, crying and drawing up LUE Pain Descriptors / Indicators: Crying;Guarding Pain Intervention(s): Monitored during session;Limited activity within patient's tolerance    Home Living Family/patient expects to be discharged to:: Skilled nursing facility Living Arrangements: Alone Available Help at Discharge: Family;Available PRN/intermittently Type of Home: House Home Access: Stairs to enter Entrance Stairs-Rails: None Entrance Stairs-Number of Steps: 2 Home Layout: One level Home Equipment: None      Prior Function Level of Independence: Independent         Comments: Per chart, pt was independent with ambulation PTA. Family checked on her daily and assisted with IADLs- shopping etc. Information about home setup and PLOF from prior admission over 1 year ago and from MD note.      Hand Dominance   Dominant Hand: Right    Extremity/Trunk Assessment   Upper Extremity Assessment Upper Extremity  Assessment: Defer to OT evaluation;LUE deficits/detail LUE Deficits / Details: LUE drawing up into flexion; seemed to have some increased tone however not sure if this is due to pain or neurological?    Lower Extremity Assessment Lower Extremity Assessment: RLE deficits/detail;LLE deficits/detail RLE Deficits / Details: Able to move ankle and initiate bending knee and straightening.  LLE Deficits / Details: No active movement noted in LLE during assessment.  LLE Sensation: decreased light touch       Communication   Communication: Expressive  difficulties;Receptive difficulties  Cognition Arousal/Alertness: Lethargic Behavior During Therapy: Flat affect Overall Cognitive Status: Difficult to assess                                 General Comments: Right gaze preference but able to get to midline for a few seconds. Able to gaze left without cues but not for very long. Followed 2 commands to squeeze fingers and bend right knee. Nodded x1 about being cold. not sure of accuracy of nods. Seemed to be in pain??      General Comments      Exercises General Exercises - Lower Extremity Heel Slides: AAROM;PROM;10 reps;Supine Hip ABduction/ADduction: PROM;AAROM;Supine;10 reps   Assessment/Plan    PT Assessment Patient needs continued PT services  PT Problem List Decreased strength;Decreased balance;Decreased cognition;Impaired tone;Pain;Decreased mobility;Decreased range of motion;Decreased activity tolerance;Impaired sensation       PT Treatment Interventions Functional mobility training;Balance training;Patient/family education;DME instruction;Gait training;Therapeutic activities;Neuromuscular re-education;Wheelchair mobility training;Therapeutic exercise;Cognitive remediation    PT Goals (Current goals can be found in the Care Plan section)  Acute Rehab PT Goals Patient Stated Goal: unable to state PT Goal Formulation: Patient unable to participate in goal setting Time For Goal Achievement: 10/29/18 Potential to Achieve Goals: Fair    Frequency Min 3X/week   Barriers to discharge Decreased caregiver support lives alone    Co-evaluation               AM-PAC PT "6 Clicks" Mobility  Outcome Measure Help needed turning from your back to your side while in a flat bed without using bedrails?: A Lot Help needed moving from lying on your back to sitting on the side of a flat bed without using bedrails?: A Lot Help needed moving to and from a bed to a chair (including a wheelchair)?: Total Help needed  standing up from a chair using your arms (e.g., wheelchair or bedside chair)?: Total Help needed to walk in hospital room?: Total Help needed climbing 3-5 steps with a railing? : Total 6 Click Score: 8    End of Session   Activity Tolerance: Patient limited by lethargy;Other (comment)(cognition and communication deficits) Patient left: in bed;with call bell/phone within reach;with bed alarm set;with SCD's reapplied Nurse Communication: Mobility status;Need for lift equipment PT Visit Diagnosis: Pain;Hemiplegia and hemiparesis;Muscle weakness (generalized) (M62.81) Hemiplegia - Right/Left: Left Hemiplegia - dominant/non-dominant: Non-dominant Hemiplegia - caused by: Cerebral infarction Pain - Right/Left: Left Pain - part of body: Arm    Time: 9024-0973 PT Time Calculation (min) (ACUTE ONLY): 13 min   Charges:   PT Evaluation $PT Eval Moderate Complexity: 1 Mod          Wray Kearns, PT, DPT Acute Rehabilitation Services Pager [phone] Office [place] 10/15/2018, 4:07 PM

## 2018-10-15 NOTE — Progress Notes (Signed)
Attempted to call family to discuss case with no answer. Stroke team to consider calling family in the AM. Patient's son [name] can be reached at [phone].  Electronically signed: Dr. Kerney Elbe

## 2018-10-15 NOTE — ED Provider Notes (Addendum)
[name] Provider Note   CSN: [id number] Arrival date & time: 10/15/18  8527    History   Chief Complaint Chief Complaint  Patient presents with   Code Stroke    HPI [name] is a 85 y.o. female.     HPI  85 year old female with extensive past medical history as below including history of A. fib, ischemic cardiomyopathy, CKD, peripheral arterial disease, heart failure, here with altered mental status.  History limited secondary to patient being a phasic.  She arrives as a code stroke.  Last seen normal around 1800 yesterday  She reportedly woke up and was unresponsive. Noted to have temp 100.6 F with EMS. Upon EMS arrival, tp had right partial gaze, L side arm and leg weakness. Has not spoken or responded in route.  Unable ot reach family member listed.  Level 5 caveat invoked as remainder of history, ROS, and physical exam limited due to patient's AMS.    Past Medical History:  Diagnosis Date   Aortic valve disorder    Chronic diastolic heart failure (HCC)    Constipation    Coronary artery disease    Dementia    Diabetes mellitus without complication (HCC)    GERD (gastroesophageal reflux disease)    Hyperlipidemia    Hypertensive heart disease without CHF    Peripheral neuropathy    Peripheral vascular disease [facility])     Patient Active Problem List   Diagnosis Date Noted   Ischemic chest pain ([place]) 05/05/2016   Cardiomyopathy, ischemic 03/27/2016   CRI (chronic renal insufficiency), stage 3 (moderate) ([place]) 03/27/2016   Chest pain, moderate coronary artery risk 03/26/2016   Acute on chronic combined systolic and diastolic CHF (congestive heart failure) (HCC)    Atrial fibrillation with rapid ventricular response ([place])    NSTEMI- Toponin 6.11 on 03/03/16    Fall    Paroxysmal atrial fibrillation ([place]) 03/02/2016   Type 2 diabetes mellitus with vascular disease ([place])    Dementia (HCC)      MI (myocardial infarction) ([place])    Hx of CABG-3 surgeries, and LM stent    Hypertensive heart disease without CHF    Hyperlipidemia    Peripheral vascular disease ([place])    Chronic diastolic heart failure (HCC)    Mild aortic stenosis    GERD (gastroesophageal reflux disease)     Past Surgical History:  Procedure Laterality Date   BREAST ENHANCEMENT SURGERY     CAROTID ENDARTERECTOMY  12/2001   CORONARY ARTERY BYPASS GRAFT  1981, [id number]   By Dr. Camillo Flaming   LAPAROSCOPIC CHOLECYSTECTOMY  2001   Repair of left iliac psuedoaneurysm following cath  2006   Repair of lymphocoele     TONSILLECTOMY       OB History   No obstetric history on file.      Home Medications    Prior to Admission medications   Medication Sig Start Date End Date Taking? Authorizing Provider  amLODipine (NORVASC) 5 MG tablet Take 1 tablet (5 mg total) by mouth 2 (two) times daily after a meal. 03/06/16  Yes Sheikh, Omair Latif, DO  aspirin EC 81 MG EC tablet Take 1 tablet (81 mg total) by mouth daily. 03/29/16  Yes Eulogio Bear U, DO  atorvastatin (LIPITOR) 20 MG tablet Take 1 tablet (20 mg total) by mouth daily at 6 PM. 03/06/16  Yes Sheikh, [place], DO  feeding supplement, ENSURE ENLIVE, (ENSURE ENLIVE) LIQD Take 237 mLs by mouth 2 (  two) times daily between meals. Patient taking differently: Take 237 mLs by mouth daily.  03/06/16  Yes Sheikh, Omair Latif, DO  furosemide (LASIX) 20 MG tablet Take 20 mg by mouth daily. 07/24/18  Yes [provider]  hydrALAZINE (APRESOLINE) 50 MG tablet Take by mouth.   Yes [provider]  isosorbide mononitrate (IMDUR) 60 MG 24 hr tablet Take 180 mg by mouth daily.    Yes [provider]  memantine (NAMENDA) 10 MG tablet Take 10 mg by mouth 2 (two) times daily.    Yes [provider]  lidocaine (LIDODERM) 5 % Place 1 patch onto the skin daily. Remove & Discard patch within 12 hours or as directed by MD Patient  not taking: Reported on 10/15/2018 03/28/16   Geradine Girt, DO  metoprolol tartrate (LOPRESSOR) 25 MG tablet Take 1 tablet (25 mg total) by mouth 2 (two) times daily. Patient not taking: Reported on 10/15/2018 03/06/16   Raiford Noble Latif, DO  pantoprazole (PROTONIX) 20 MG tablet Take 1 tablet (20 mg total) by mouth daily. Patient not taking: Reported on 10/15/2018 03/29/16   Geradine Girt, DO  polyethylene glycol (MIRALAX / GLYCOLAX) packet Take 17 g by mouth daily. Patient not taking: Reported on 10/15/2018 03/28/16   Geradine Girt, DO  potassium chloride SA (K-DUR,KLOR-CON) 20 MEQ tablet Take 1 tablet (20 mEq total) by mouth daily. Patient not taking: Reported on 10/15/2018 04/23/16   Daleen Bo, MD    Family History Family History  Problem Relation Age of Onset   Coronary artery disease Brother    Coronary artery disease Brother    Coronary artery disease Brother     Social History Social History   Tobacco Use   Smoking status: Former Smoker   Smokeless tobacco: Never Used  Substance Use Topics   Alcohol use: No   Drug use: No     Allergies   Clindamycin/lincomycin   Review of Systems Review of Systems  Unable to perform ROS: Mental status change     Physical Exam Updated Vital Signs BP (!) 163/83    Pulse (!) 111    Temp 99 F (37.2 C) (Rectal)    Resp 15    SpO2 100%   Physical Exam Vitals signs and nursing note reviewed.  Constitutional:      General: She is not in acute distress.    Appearance: She is well-developed.     Comments: Elderly, frail appearing  HENT:     Head: Normocephalic and atraumatic.  Eyes:     Conjunctiva/sclera: Conjunctivae normal.     Comments: PERRL. Partial right gaze.  Neck:     Musculoskeletal: Neck supple.  Cardiovascular:     Rate and Rhythm: Tachycardia present. Rhythm irregular.     Heart sounds: Normal heart sounds. No murmur. No friction rub.  Pulmonary:     Effort: Pulmonary effort is normal. No  respiratory distress.     Breath sounds: Rhonchi present. No wheezing or rales.  Abdominal:     General: There is no distension.     Palpations: Abdomen is soft.     Tenderness: There is no abdominal tenderness.  Skin:    General: Skin is warm.     Capillary Refill: Capillary refill takes less than 2 seconds.  Neurological:     Mental Status: She is alert.     Motor: No abnormal muscle tone.     Comments: Partial right ward gaze. Not moving LUE and LLE.  ED Treatments / Results  Labs (all labs ordered are listed, but only abnormal results are displayed) Labs Reviewed  CBC - Abnormal; Notable for the following components:      Result Value   WBC 14.8 (*)    All other components within normal limits  DIFFERENTIAL - Abnormal; Notable for the following components:   Neutro Abs 13.4 (*)    Abs Immature Granulocytes 0.08 (*)    All other components within normal limits  COMPREHENSIVE METABOLIC PANEL - Abnormal; Notable for the following components:   Potassium 3.3 (*)    Glucose, Bld 150 (*)    Creatinine, Ser 1.12 (*)    GFR calc non Af Amer 44 (*)    GFR calc Af Amer 50 (*)    All other components within normal limits  I-STAT CHEM 8, ED - Abnormal; Notable for the following components:   Potassium 3.2 (*)    Glucose, Bld 152 (*)    Calcium, Ion 1.12 (*)    Hemoglobin 15.3 (*)    All other components within normal limits  CBG MONITORING, ED - Abnormal; Notable for the following components:   Glucose-Capillary 143 (*)    All other components within normal limits  POCT I-STAT EG7 - Abnormal; Notable for the following components:   pCO2, Ven 43.1 (*)    Potassium 3.1 (*)    Calcium, Ion 1.14 (*)    Hemoglobin 15.6 (*)    All other components within normal limits  SARS CORONAVIRUS 2 (HOSPITAL ORDER, [place] [facility])  CULTURE, BLOOD (ROUTINE X 2)  CULTURE, BLOOD (ROUTINE X 2)  PROTIME-INR  APTT  LACTIC ACID, PLASMA  LACTIC ACID, PLASMA     EKG EKG Interpretation  Date/Time:  Tuesday Oct 15 2018 09:06:14 EDT Ventricular Rate:  111 PR Interval:    QRS Duration: 123 QT Interval:  329 QTC Calculation: 447 R Axis:   42 Text Interpretation:  Atrial fibrillation LVH with secondary repolarization abnormality Artifact in lead(s) I III aVR aVL aVF V5 V6 Since last EKG, AFib has replaced sinus rhythm Non-specific St-t changes, likely rate-related Confirmed by Duffy Bruce [phone]) on 10/15/2018 11:25:10 AM   Radiology Ct Code Stroke Cta Head W/wo Contrast  Result Date: 10/15/2018 CLINICAL DATA:  Code stroke. Last seen normal at 6 o'clock p.m. last night. Aphasia. Left-sided weakness. EXAM: CT ANGIOGRAPHY HEAD AND NECK CT PERFUSION BRAIN TECHNIQUE: Multidetector CT imaging of the head and neck was performed using the standard protocol during bolus administration of intravenous contrast. Multiplanar CT image reconstructions and MIPs were obtained to evaluate the vascular anatomy. Carotid stenosis measurements (when applicable) are obtained utilizing NASCET criteria, using the distal internal carotid diameter as the denominator. Multiphase CT imaging of the brain was performed following IV bolus contrast injection. Subsequent parametric perfusion maps were calculated using RAPID software. CONTRAST:  155mL OMNIPAQUE IOHEXOL 350 MG/ML SOLN COMPARISON:  CT head without contrast 10/15/2018. FINDINGS: CTA NECK FINDINGS Aortic arch: Extensive vascular calcifications are present at the aortic arch and origins of the great vessels. A 3 vessel arch configuration is present. There dense calcifications at the origin of the left common carotid artery with greater than 60% stenosis proximally. There is no significant stenosis at the origin of the innominate or of the left subclavian artery. There is no aneurysm. Right carotid system: Right common carotid artery demonstrates some proximal calcifications from the innominate. There distal mural  calcifications proximal bifurcation. Extensive calcified plaque is present at the right carotid  bifurcation without a significant stenosis of greater than 50% relative to the more distal vessel. The more distal right ICA is normal. Left carotid system: The left common carotid artery demonstrates the proximal stenosis. Additional mural calcifications are present without other tandem stenoses. Extensive vascular calcifications are present at the left carotid bifurcation without a significant stenosis of greater than 50% relative to the more distal vessel. The more distal left ICA is normal. Vertebral arteries: The vertebral arteries are codominant. Calcifications are present in the subclavian arteries bilaterally without significant vertebral artery stenoses. There is no significant vertebral artery stenosis or vascular injury in the neck. Skeleton: Endplate degenerative changes and uncovertebral spurring is worse right than left at C5-6 and C6-7. Extensive facet changes are also worse on the right, more in the upper cervical spine. No focal lytic or blastic lesions are present. The patient is status post median sternotomy. Vertebral body heights are maintained. Calvarium is intact. Other neck: No focal mucosal or submucosal lesions are present. Thyroid and salivary glands are normal. There is no significant adenopathy. Upper chest: Centrilobular emphysematous changes are present. There is no focal nodule, mass, or airspace disease. Review of the MIP images confirms the above findings CTA HEAD FINDINGS Anterior circulation: Posterior circulation: Atherosclerotic calcifications are present within the cavernous internal carotid arteries bilaterally. There is no significant stenosis of either vessel relative to the ICA terminus. The A1 and M1 segments are normal. The anterior communicating artery is patent. MCA bifurcations are intact bilaterally. There is asymmetric attenuation of distal left MCA branch vessels compared  to the right. No significant proximal stenosis or occlusion is present. Mild distal irregularity is present in the ACA branches bilaterally. Venous sinuses: Atherosclerotic calcifications are present at the dural margin of both vertebral arteries. There is a moderate left V3 segment stenosis. Vertebrobasilar junction is normal. PICA origins are visualized and normal. Basilar artery is normal. Both posterior cerebral arteries originate from the basilar tip. There is some attenuation of distal PCA branch vessels without significant proximal stenosis or occlusion. Anatomic variants: None Delayed phase: Review of the MIP images confirms the above findings CT Brain Perfusion Findings: ASPECTS: 10/10 CBF (<30%) Volume: 80mL Perfusion (Tmax>6.0s) volume: 66mL Mismatch Volume: 4mL Infarction Location:There is asymmetric ischemia and watershed distribution over the left convexity. No infarct is present. IMPRESSION: 1. Asymmetric decreased perfusion to the left hemisphere evident by decreased cerebral blood flow in a watershed distribution and decreased collateral vessels visible on the CTA. 2. The most significant proximal stenosis on the left is in the proximal left common carotid artery near the origin. There is a stenosis of at least 60%. 3. Extensive atherosclerotic changes are present at the left carotid bifurcation and left cavernous internal carotid artery without a significant stenosis of greater than 50%. 4. No large vessel occlusion. 5. Moderate left vertebral artery stenosis at the V3 segment. 6. Other significant atherosclerotic calcifications as described without other significant stenoses. 7. Moderate degenerative changes in the cervical spine. These results were called by telephone at the time of interpretation on 10/15/2018 at 9:54 am to Dr. Kerney Elbe , who verbally acknowledged these results. Electronically Signed   By: San Morelle M.D.   On: 10/15/2018 10:03   Ct Code Stroke Cta Neck W/wo  Contrast  Result Date: 10/15/2018 CLINICAL DATA:  Code stroke. Last seen normal at 6 o'clock p.m. last night. Aphasia. Left-sided weakness. EXAM: CT ANGIOGRAPHY HEAD AND NECK CT PERFUSION BRAIN TECHNIQUE: Multidetector CT imaging of the head and neck was  performed using the standard protocol during bolus administration of intravenous contrast. Multiplanar CT image reconstructions and MIPs were obtained to evaluate the vascular anatomy. Carotid stenosis measurements (when applicable) are obtained utilizing NASCET criteria, using the distal internal carotid diameter as the denominator. Multiphase CT imaging of the brain was performed following IV bolus contrast injection. Subsequent parametric perfusion maps were calculated using RAPID software. CONTRAST:  16mL OMNIPAQUE IOHEXOL 350 MG/ML SOLN COMPARISON:  CT head without contrast 10/15/2018. FINDINGS: CTA NECK FINDINGS Aortic arch: Extensive vascular calcifications are present at the aortic arch and origins of the great vessels. A 3 vessel arch configuration is present. There dense calcifications at the origin of the left common carotid artery with greater than 60% stenosis proximally. There is no significant stenosis at the origin of the innominate or of the left subclavian artery. There is no aneurysm. Right carotid system: Right common carotid artery demonstrates some proximal calcifications from the innominate. There distal mural calcifications proximal bifurcation. Extensive calcified plaque is present at the right carotid bifurcation without a significant stenosis of greater than 50% relative to the more distal vessel. The more distal right ICA is normal. Left carotid system: The left common carotid artery demonstrates the proximal stenosis. Additional mural calcifications are present without other tandem stenoses. Extensive vascular calcifications are present at the left carotid bifurcation without a significant stenosis of greater than 50% relative to the  more distal vessel. The more distal left ICA is normal. Vertebral arteries: The vertebral arteries are codominant. Calcifications are present in the subclavian arteries bilaterally without significant vertebral artery stenoses. There is no significant vertebral artery stenosis or vascular injury in the neck. Skeleton: Endplate degenerative changes and uncovertebral spurring is worse right than left at C5-6 and C6-7. Extensive facet changes are also worse on the right, more in the upper cervical spine. No focal lytic or blastic lesions are present. The patient is status post median sternotomy. Vertebral body heights are maintained. Calvarium is intact. Other neck: No focal mucosal or submucosal lesions are present. Thyroid and salivary glands are normal. There is no significant adenopathy. Upper chest: Centrilobular emphysematous changes are present. There is no focal nodule, mass, or airspace disease. Review of the MIP images confirms the above findings CTA HEAD FINDINGS Anterior circulation: Posterior circulation: Atherosclerotic calcifications are present within the cavernous internal carotid arteries bilaterally. There is no significant stenosis of either vessel relative to the ICA terminus. The A1 and M1 segments are normal. The anterior communicating artery is patent. MCA bifurcations are intact bilaterally. There is asymmetric attenuation of distal left MCA branch vessels compared to the right. No significant proximal stenosis or occlusion is present. Mild distal irregularity is present in the ACA branches bilaterally. Venous sinuses: Atherosclerotic calcifications are present at the dural margin of both vertebral arteries. There is a moderate left V3 segment stenosis. Vertebrobasilar junction is normal. PICA origins are visualized and normal. Basilar artery is normal. Both posterior cerebral arteries originate from the basilar tip. There is some attenuation of distal PCA branch vessels without significant  proximal stenosis or occlusion. Anatomic variants: None Delayed phase: Review of the MIP images confirms the above findings CT Brain Perfusion Findings: ASPECTS: 10/10 CBF (<30%) Volume: 4mL Perfusion (Tmax>6.0s) volume: 65mL Mismatch Volume: 49mL Infarction Location:There is asymmetric ischemia and watershed distribution over the left convexity. No infarct is present. IMPRESSION: 1. Asymmetric decreased perfusion to the left hemisphere evident by decreased cerebral blood flow in a watershed distribution and decreased collateral vessels visible on the CTA.  2. The most significant proximal stenosis on the left is in the proximal left common carotid artery near the origin. There is a stenosis of at least 60%. 3. Extensive atherosclerotic changes are present at the left carotid bifurcation and left cavernous internal carotid artery without a significant stenosis of greater than 50%. 4. No large vessel occlusion. 5. Moderate left vertebral artery stenosis at the V3 segment. 6. Other significant atherosclerotic calcifications as described without other significant stenoses. 7. Moderate degenerative changes in the cervical spine. These results were called by telephone at the time of interpretation on 10/15/2018 at 9:54 am to Dr. Kerney Elbe , who verbally acknowledged these results. Electronically Signed   By: San Morelle M.D.   On: 10/15/2018 10:03   Ct Code Stroke Cerebral Perfusion With Contrast  Result Date: 10/15/2018 CLINICAL DATA:  Code stroke. Last seen normal at 6 o'clock p.m. last night. Aphasia. Left-sided weakness. EXAM: CT ANGIOGRAPHY HEAD AND NECK CT PERFUSION BRAIN TECHNIQUE: Multidetector CT imaging of the head and neck was performed using the standard protocol during bolus administration of intravenous contrast. Multiplanar CT image reconstructions and MIPs were obtained to evaluate the vascular anatomy. Carotid stenosis measurements (when applicable) are obtained utilizing NASCET criteria,  using the distal internal carotid diameter as the denominator. Multiphase CT imaging of the brain was performed following IV bolus contrast injection. Subsequent parametric perfusion maps were calculated using RAPID software. CONTRAST:  131mL OMNIPAQUE IOHEXOL 350 MG/ML SOLN COMPARISON:  CT head without contrast 10/15/2018. FINDINGS: CTA NECK FINDINGS Aortic arch: Extensive vascular calcifications are present at the aortic arch and origins of the great vessels. A 3 vessel arch configuration is present. There dense calcifications at the origin of the left common carotid artery with greater than 60% stenosis proximally. There is no significant stenosis at the origin of the innominate or of the left subclavian artery. There is no aneurysm. Right carotid system: Right common carotid artery demonstrates some proximal calcifications from the innominate. There distal mural calcifications proximal bifurcation. Extensive calcified plaque is present at the right carotid bifurcation without a significant stenosis of greater than 50% relative to the more distal vessel. The more distal right ICA is normal. Left carotid system: The left common carotid artery demonstrates the proximal stenosis. Additional mural calcifications are present without other tandem stenoses. Extensive vascular calcifications are present at the left carotid bifurcation without a significant stenosis of greater than 50% relative to the more distal vessel. The more distal left ICA is normal. Vertebral arteries: The vertebral arteries are codominant. Calcifications are present in the subclavian arteries bilaterally without significant vertebral artery stenoses. There is no significant vertebral artery stenosis or vascular injury in the neck. Skeleton: Endplate degenerative changes and uncovertebral spurring is worse right than left at C5-6 and C6-7. Extensive facet changes are also worse on the right, more in the upper cervical spine. No focal lytic or  blastic lesions are present. The patient is status post median sternotomy. Vertebral body heights are maintained. Calvarium is intact. Other neck: No focal mucosal or submucosal lesions are present. Thyroid and salivary glands are normal. There is no significant adenopathy. Upper chest: Centrilobular emphysematous changes are present. There is no focal nodule, mass, or airspace disease. Review of the MIP images confirms the above findings CTA HEAD FINDINGS Anterior circulation: Posterior circulation: Atherosclerotic calcifications are present within the cavernous internal carotid arteries bilaterally. There is no significant stenosis of either vessel relative to the ICA terminus. The A1 and M1 segments are normal. The  anterior communicating artery is patent. MCA bifurcations are intact bilaterally. There is asymmetric attenuation of distal left MCA branch vessels compared to the right. No significant proximal stenosis or occlusion is present. Mild distal irregularity is present in the ACA branches bilaterally. Venous sinuses: Atherosclerotic calcifications are present at the dural margin of both vertebral arteries. There is a moderate left V3 segment stenosis. Vertebrobasilar junction is normal. PICA origins are visualized and normal. Basilar artery is normal. Both posterior cerebral arteries originate from the basilar tip. There is some attenuation of distal PCA branch vessels without significant proximal stenosis or occlusion. Anatomic variants: None Delayed phase: Review of the MIP images confirms the above findings CT Brain Perfusion Findings: ASPECTS: 10/10 CBF (<30%) Volume: 78mL Perfusion (Tmax>6.0s) volume: 65mL Mismatch Volume: 65mL Infarction Location:There is asymmetric ischemia and watershed distribution over the left convexity. No infarct is present. IMPRESSION: 1. Asymmetric decreased perfusion to the left hemisphere evident by decreased cerebral blood flow in a watershed distribution and decreased  collateral vessels visible on the CTA. 2. The most significant proximal stenosis on the left is in the proximal left common carotid artery near the origin. There is a stenosis of at least 60%. 3. Extensive atherosclerotic changes are present at the left carotid bifurcation and left cavernous internal carotid artery without a significant stenosis of greater than 50%. 4. No large vessel occlusion. 5. Moderate left vertebral artery stenosis at the V3 segment. 6. Other significant atherosclerotic calcifications as described without other significant stenoses. 7. Moderate degenerative changes in the cervical spine. These results were called by telephone at the time of interpretation on 10/15/2018 at 9:54 am to Dr. Kerney Elbe , who verbally acknowledged these results. Electronically Signed   By: San Morelle M.D.   On: 10/15/2018 10:03   Dg Chest Portable 1 View  Result Date: 10/15/2018 CLINICAL DATA:  Left-sided weakness vomiting.  Possible aspiration. EXAM: PORTABLE CHEST 1 VIEW COMPARISON:  Radiographs 05/05/2016 and 04/24/2016 FINDINGS: 1032 hours. The heart size and mediastinal contours are stable status post median sternotomy and CABG. There is aortic atherosclerosis. The lungs are clear. There is no pleural effusion or pneumothorax. No acute osseous findings are evident. Prominent bilateral breast capsular calcifications are noted. Telemetry leads overlie the chest. IMPRESSION: Stable chest.  No active cardiopulmonary process. Electronically Signed   By: Richardean Sale M.D.   On: 10/15/2018 10:45   Ct Head Code Stroke Wo Contrast  Result Date: 10/15/2018 CLINICAL DATA:  Code stroke.  Left-sided paralysis and aphasia. EXAM: CT HEAD WITHOUT CONTRAST TECHNIQUE: Contiguous axial images were obtained from the base of the skull through the vertex without intravenous contrast. 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 Chronic small-vessel ischemic  changes of the pons. Generalized age related atrophy. Chronic small-vessel ischemic changes of the hemispheric white matter. No sign of acute cortical infarction. No mass lesion, hemorrhage, hydrocephalus or extra-axial collection. Vascular: There is atherosclerotic calcification of the major vessels at the base of the brain. Skull: Negative Sinuses/Orbits: Clear/normal Other: None ASPECTS ([place] Stroke Program Early CT Score) - Ganglionic level infarction (caudate, lentiform nuclei, internal capsule, insula, M1-M3 cortex): 7 - Supraganglionic infarction (M4-M6 cortex): 3 Total score (0-10 with 10 being normal): 10 IMPRESSION: 1. No acute finding by CT. Atrophy and chronic small-vessel ischemic changes. 2. ASPECTS is 10. 3. These results were communicated to Dr. Cheral Marker at [facility] 5/26/2020by text page via the [facility] messaging system. Electronically Signed   By: Jan Fireman.D.  On: 10/15/2018 09:23    Procedures .Critical Care Performed by: Duffy Bruce, MD Authorized by: Duffy Bruce, MD   Critical care provider statement:    Critical care time (minutes):  35   Critical care time was exclusive of:  Separately billable procedures and treating other patients and teaching time   Critical care was necessary to treat or prevent imminent or life-threatening deterioration of the following conditions:  Cardiac failure, circulatory failure and CNS failure or compromise   Critical care was time spent personally by me on the following activities:  Development of treatment plan with patient or surrogate, discussions with consultants, evaluation of patient's response to treatment, examination of patient, obtaining history from patient or surrogate, ordering and performing treatments and interventions, ordering and review of laboratory studies, ordering and review of radiographic studies, pulse oximetry, re-evaluation of patient's condition and review of old charts   I assumed direction of critical care  for this patient from another provider in my specialty: no     (including critical care time)  Medications Ordered in ED Medications  sodium chloride flush (NS) 0.9 % injection 3 mL (has no administration in time range)  metoprolol tartrate (LOPRESSOR) injection 2.5 mg (has no administration in time range)  iohexol (OMNIPAQUE) 350 MG/ML injection 100 mL (100 mLs Intravenous Contrast Given 10/15/18 0934)  sodium chloride 0.9 % bolus 1,000 mL (1,000 mLs Intravenous New Bag/Given 10/15/18 1043)  piperacillin-tazobactam (ZOSYN) IVPB 3.375 g (3.375 g Intravenous New Bag/Given 10/15/18 1042)     Initial Impression / Assessment and Plan / ED Course  I have reviewed the triage vital signs and the nursing notes.  Pertinent labs & imaging results that were available during my care of the patient were reviewed by me and considered in my medical decision making (see chart for details).  Clinical Course as of Oct 14 1132  Tue Oct 15, 2018  0943 Attempted to call son [name] without success.   [CI]    Clinical Course User Index [CI] Duffy Bruce, MD       85 yo F here with AMS, confusion, L sided weakness. LKN 1800 last night. CODE STROKE activated. Pt also hypoxic, febrile on arrival - started on ABX for aspiration PNA given h/o vomiting this AM. Concern for acute CVA vs encephalopathy 2/2 hypoxia, PNA.   CT concerning for watershed infarct/embolic infarct. This fits somewhat with her AFib history. Will f/u Neuro reccs re: anticoagulation. Hypoxia etiology remains unclear - lungs sounds are c/w aspiration though CXR is clear. Continue O2, may need PE study once she's able to receive contrast.  Final Clinical Impressions(s) / ED Diagnoses   Final diagnoses:  Acute ischemic stroke [facility])  Atrial fibrillation with rapid ventricular response [facility])  Hypoxia    ED Discharge Orders    None       Duffy Bruce, MD 10/15/18 1134    Duffy Bruce, MD 11/12/18 0830

## 2018-10-16 ENCOUNTER — Inpatient Hospital Stay (HOSPITAL_COMMUNITY): Payer: Medicare Other

## 2018-10-16 DIAGNOSIS — R569 Unspecified convulsions: Secondary | ICD-10-CM

## 2018-10-16 DIAGNOSIS — Z7189 Other specified counseling: Secondary | ICD-10-CM

## 2018-10-16 DIAGNOSIS — I361 Nonrheumatic tricuspid (valve) insufficiency: Secondary | ICD-10-CM

## 2018-10-16 LAB — LIPID PANEL
Cholesterol: 146 mg/dL (ref 0–200)
HDL: 61 mg/dL (ref >40)
LDL Cholesterol: 66 mg/dL (ref 0–99)
Total CHOL/HDL Ratio: 2.4 RATIO
Triglycerides: 97 mg/dL (ref <150)
VLDL: 19 mg/dL (ref 0–40)

## 2018-10-16 LAB — ECHOCARDIOGRAM COMPLETE
Height: 62 in
Weight: 1984 oz

## 2018-10-16 LAB — HEMOGLOBIN A1C
Hgb A1c MFr Bld: 6 % — ABNORMAL HIGH (ref 4.8–5.6)
Mean Plasma Glucose: 125.5 mg/dL

## 2018-10-16 LAB — AMMONIA: Ammonia: 29 umol/L (ref 9–35)

## 2018-10-16 MED ORDER — LEVETIRACETAM IN NACL 1000 MG/100ML IV SOLN
1000.0000 mg | Freq: Two times a day (BID) | INTRAVENOUS | Status: DC
  Administered 2018-10-16 – 2018-10-18 (×4): 1000 mg via INTRAVENOUS
  Filled 2018-10-16 (×5): qty 100

## 2018-10-16 MED ORDER — VALPROATE SODIUM 500 MG/5ML IV SOLN
15.0000 mg/kg/d | Freq: Three times a day (TID) | INTRAVENOUS | Status: DC
  Administered 2018-10-17 – 2018-10-18 (×5): 281 mg via INTRAVENOUS
  Filled 2018-10-16 (×7): qty 2.81

## 2018-10-16 MED ORDER — SODIUM CHLORIDE 0.9 % IV SOLN
3000.0000 mg | INTRAVENOUS | Status: AC
  Administered 2018-10-16: 3000 mg via INTRAVENOUS
  Filled 2018-10-16: qty 30

## 2018-10-16 MED ORDER — VALPROATE SODIUM 500 MG/5ML IV SOLN
1500.0000 mg | Freq: Once | INTRAVENOUS | Status: AC
  Administered 2018-10-16: 19:00:00 1500 mg via INTRAVENOUS
  Filled 2018-10-16: qty 15

## 2018-10-16 NOTE — Procedures (Signed)
  Sunray A. Merlene Laughter, MD     [email or web address]           HISTORY: The patient 85 year old female who presents with episode of confusion and focal weakness worrisome for seizures.  MEDICATIONS:  Current Facility-Administered Medications:  .  0.9 %  sodium chloride infusion, , Intravenous, Continuous, Smith, Rondell A, MD, Last Rate: 50 mL/hr at 10/16/18 0610 .  acetaminophen (TYLENOL) tablet 650 mg, 650 mg, Oral, Q4H PRN **OR** acetaminophen (TYLENOL) solution 650 mg, 650 mg, Per Tube, Q4H PRN **OR** acetaminophen (TYLENOL) suppository 650 mg, 650 mg, Rectal, Q4H PRN, Smith, Rondell A, MD .  aspirin suppository 300 mg, 300 mg, Rectal, Daily, Tamala Julian, Rondell A, MD, 300 mg at 10/16/18 0935 .  enoxaparin (LOVENOX) injection 55 mg, 1 mg/kg, Subcutaneous, Q24H, Smith, Rondell A, MD, 55 mg at 10/16/18 1434 .  iohexol (OMNIPAQUE) 350 MG/ML injection 100 mL, 100 mL, Intravenous, Once PRN, Tamala Julian, Rondell A, MD .  levETIRAcetam (KEPPRA) IVPB 1000 mg/100 mL premix, 1,000 mg, Intravenous, BID, Greta Doom, MD .  metoprolol tartrate (LOPRESSOR) injection 2.5 mg, 2.5 mg, Intravenous, Q6H PRN, Smith, Rondell A, MD .  pantoprazole (PROTONIX) injection 40 mg, 40 mg, Intravenous, Q24H, Smith, Rondell A, MD, 40 mg at 10/15/18 2036 .  senna-docusate (Senokot-S) tablet 1 tablet, 1 tablet, Oral, QHS PRN, Smith, Rondell A, MD .  sodium chloride flush (NS) 0.9 % injection 3 mL, 3 mL, Intravenous, Once, Smith, Rondell A, MD .  valproate (DEPACON) 1,500 mg in dextrose 5 % 50 mL IVPB, 1,500 mg, Intravenous, Once, Kerney Elbe, MD .  Derrill Memo ON 10/17/2018] valproate (DEPACON) 281 mg in dextrose 5 % 50 mL IVPB, 15 mg/kg/day, Intravenous, Q8H, Kerney Elbe, MD     ANALYSIS: A 16 channel recording using standard 10 20 measurements is conducted for 24 minutes.  The background activity gets as high as 7 Hz.  There is beta activity observed in frontal areas.  Photic stimulation and  hyperventilation are not conducted.  There are numerous episodes of generalized 1 Hz delta slow wave activity associated with spike slow wave activity involving the right frontal region specifically FP2 and F4.   IMPRESSION: 1.  Numerous episodes of right frontal epileptiform discharges associated with generalized 1 Hz delta slow wave activity. 2.  Mild global slowing.      Kaleem Sartwell A. Merlene Laughter, M.D.  Diplomate, Tax adviser of Psychiatry and Neurology ( Neurology).

## 2018-10-16 NOTE — Consult Note (Signed)
Consultation Note Date: 10/16/2018   Patient Name: [name]  DOB: [birth year]  MRN: [id number]  Age / Sex: 85 y.o., female  PCP: Mayra Neer, MD Referring Physician: [place] Eric J, DO  Reason for Consultation: Establishing goals of care  HPI/Patient Profile: 85 y.o. female  with past medical history of atrial fibrillation (not on anticoagulation), systolic CHF, CAD s/p CABG, dementia, diabetes, PVD admitted on 10/15/2018 with with aphasia and left-sided weakness with concern for stroke and seizures. Concern also for aspiration pneumonia. Awaiting EEG results. Minimally responsive at times and overall prognosis poor.   Clinical Assessment and Goals of Care: I have spoken with son, [name]. We had a very good conversation in which he explained that his mother's wishes are very clear. She would never want to live in a facility. She would never want her life prolonged artificially (DNR confirmed, no artificial nutrition or hydration). They respect her wishes and understand that she is likely approaching EOL. They are open to consideration of hospice (they mention [facility]). We will speak further tomorrow about hospice after EEG results and if no clinical improvement in neurological status. [name] is very clear that her comfort is first priority.   Primary Decision Maker NEXT OF KIN son [name]    SUMMARY OF RECOMMENDATIONS   - Comfort is priority; NO feeding tube - Not interested in life prolonging measures with anticipated poor QOL (would never want to live in a facility and I do not foresee she would ever be able to return home) - Consider transition to [place]:  DNR   Symptom Management:   None currently. Seizure precautions and treatment per neurology.   Palliative Prophylaxis:   Aspiration, Delirium Protocol, Oral Care and Turn Reposition   Additional Recommendations (Limitations, Scope, Preferences):  No Artificial Feeding  Psycho-social/Spiritual:   Desire for further Chaplaincy support:no  Additional Recommendations: Education on Hospice and Grief/Bereavement Support  Prognosis:   < 2 weeks very unlikely with no desire for aggressive interventions  Discharge Planning: To Be Determined. Most likely hospice facility.       Primary Diagnoses: Present on Admission: . CVA (cerebral vascular accident) ([place]) . Hypokalemia . Acute respiratory failure with hypoxia ([place]) . Dementia ([place]) . Type 2 diabetes mellitus with vascular disease ([place])   I have reviewed the medical record, interviewed the patient and family, and examined the patient. The following aspects are pertinent.  Past Medical History:  Diagnosis Date  . Aortic valve disorder   . Chronic diastolic heart failure ([place])   . Constipation   . Coronary artery disease   . Dementia   . Diabetes mellitus without complication ([place])   . GERD (gastroesophageal reflux disease)   . Hyperlipidemia   . Hypertensive heart disease without CHF   . Peripheral neuropathy   . Peripheral vascular disease ([place])    Social History   Socioeconomic History  . Marital status: Widowed    Spouse name: Not on file  . Number of children: Not on  file  . Years of education: Not on file  . Highest education level: Not on file  Occupational History  . Not on file  Social Needs  . Financial resource strain: Not on file  . Food insecurity:    Worry: Not on file    Inability: Not on file  . Transportation needs:    Medical: Not on file    Non-medical: Not on file  Tobacco Use  . Smoking status: Former Research scientist (life sciences)  . Smokeless tobacco: Never Used  Substance and Sexual Activity  . Alcohol use: No  . Drug use: No  . Sexual activity: Never  Lifestyle  . Physical activity:    Days per week: Not on file    Minutes per session: Not on file  . Stress: Not on file   Relationships  . Social connections:    Talks on phone: Not on file    Gets together: Not on file    Attends religious service: Not on file    Active member of club or organization: Not on file    Attends meetings of clubs or organizations: Not on file    Relationship status: Not on file  Other Topics Concern  . 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 Coronary artery disease Brother   . Coronary artery disease Brother   . Coronary artery disease Brother    Scheduled Meds: . aspirin  300 mg Rectal Daily  . enoxaparin (LOVENOX) injection  1 mg/kg Subcutaneous Q24H  . pantoprazole (PROTONIX) IV  40 mg Intravenous Q24H  . sodium chloride flush  3 mL Intravenous Once   Continuous Infusions: . sodium chloride 50 mL/hr at 10/16/18 0610  . levETIRAcetam     PRN Meds:.acetaminophen **OR** acetaminophen (TYLENOL) oral liquid 160 mg/5 mL **OR** acetaminophen, iohexol, metoprolol tartrate, senna-docusate Allergies  Allergen Reactions  . Clindamycin/Lincomycin Rash and Other (See Comments)    Heart problems    Review of Systems  Unable to perform ROS: Acuity of condition    Physical Exam Vitals signs and nursing note reviewed.  Constitutional:      Appearance: She is ill-appearing.     Comments: Frail, elderly  Cardiovascular:     Rate and Rhythm: Normal rate.  Pulmonary:     Effort: Pulmonary effort is normal. No tachypnea, accessory muscle usage or respiratory distress.  Abdominal:     Palpations: Abdomen is soft.  Neurological:     Mental Status: She is unresponsive.     Vital Signs: BP 133/75 (BP Location: Right Arm)   Pulse 85   Temp 98.9 F (37.2 C) (Oral)   Resp 20   Ht 5\' 2"  (1.575 m)   Wt 56.2 kg   SpO2 97%   BMI 22.68 kg/m  Pain Scale: Faces   Pain Score: 0-No pain   SpO2: SpO2: 97 % O2 Device:SpO2: 97 % O2 Flow Rate: .O2 Flow Rate (L/min): 2  L/min  IO: Intake/output summary:   Intake/Output Summary (Last 24 hours) at 10/16/2018 1501 Last data filed at 10/16/2018 0610 Gross per 24 hour  Intake 1268.84 ml  Output 850 ml  Net 418.84 ml    LBM: Last BM Date: 10/16/18 Baseline Weight: Weight: 56.2 kg Most recent weight: Weight: 56.2 kg     Palliative Assessment/Data: 10%     Time In: 1450 Time Out: 1600 Time Total: 70 min Greater  than 50%  of this time was spent counseling and coordinating care related to the above assessment and plan.  Signed by: Deandrae Wajda, NP Palliative Medicine Team Pager # [phone] (M-F 8a-5p) Team Phone # [phone] (Nights/Weekends)                

## 2018-10-16 NOTE — Progress Notes (Signed)
Palliative:  Full note to follow. I have spoken with son, [name]. We had a very good conversation in which he explained that his mother's wishes are very clear. She would never want to live in a facility. She would never want her life prolonged artificially (DNR confirmed, no artificial nutrition or hydration). They respect her wishes and understand that she is likely approaching EOL. They are open to consideration of hospice (they mention [employer] [facility]). We will speak further tomorrow about hospice after EEG results and if no clinical improvement in neurological status. [name] is very clear that her comfort is first priority.   Ariel Sill, NP Palliative Medicine Team Pager # [phone] (M-F 8a-5p) Team Phone # [phone] (Nights/Weekends)

## 2018-10-16 NOTE — Progress Notes (Addendum)
Subjective: Some tremor of RUE noted by PT this AM.   Objective: Current vital signs: BP (!) 157/75 (BP Location: Right Arm)   Pulse 84   Temp 98.9 F (37.2 C) (Oral)   Resp 16   Ht 5\' 2"  (1.575 m)   Wt 56.2 kg   SpO2 99%   BMI 22.68 kg/m  Vital signs in last 24 hours: Temp:  [98.7 F (37.1 C)-99.2 F (37.3 C)] 98.9 F (37.2 C) (05/27 0748) Pulse Rate:  [84-123] 84 (05/27 0748) Resp:  [15-23] 16 (05/27 0748) BP: (127-194)/(74-161) 157/75 (05/27 0748) SpO2:  [95 %-100 %] 99 % (05/27 0748) Weight:  [56.2 kg] 56.2 kg (05/26 1300)  Intake/Output from previous day: 05/26 0701 - 05/27 0700 In: 1368.8 [I.V.:640.7; IV Piggyback:728.1] Out: 850 [Urine:850] Intake/Output this shift: No intake/output data recorded. Nutritional status:  Diet Order            Diet NPO time specified  Diet effective now              Neurologic Exam: Ment: Awake with decreased level of alertness. Mute. Follows only simple commands and requiring frequent repetition. Does not engage examiner with eye contact.  CN: Face symmetric.  Motor: Occasional twitching of RUE and LUE. Severe bradykinesia.  Gait: Stands with max assist with PT. Will bear a portion of her own weight. Stooped posture.   Lab Results: Results for orders placed or performed during the hospital encounter of 10/15/18 (from the past 48 hour(s))  Protime-INR     Status: None   Collection Time: 10/15/18  9:03 AM  Result Value Ref Range   Prothrombin Time 12.5 11.4 - 15.2 seconds   INR 0.9 0.8 - 1.2    Comment: (NOTE) INR goal varies based on device and disease states. Performed at [facility], [place] [address] [place], [place] [address]   APTT     Status: None   Collection Time: 10/15/18  9:03 AM  Result Value Ref Range   aPTT 29 24 - 36 seconds    Comment: Performed at [facility] [address], [place], [place] [address]  CBC     Status: Abnormal   Collection Time: 10/15/18  9:03 AM  Result Value Ref  Range   WBC 14.8 (H) 4.0 - 10.5 K/uL   RBC 5.09 3.87 - 5.11 MIL/uL   Hemoglobin 14.8 12.0 - 15.0 g/dL   HCT 44.4 36.0 - 46.0 %   MCV 87.2 80.0 - 100.0 fL   MCH 29.1 26.0 - 34.0 pg   MCHC 33.3 30.0 - 36.0 g/dL   RDW 12.6 11.5 - 15.5 %   Platelets 332 150 - 400 K/uL   nRBC 0.0 0.0 - 0.2 %    Comment: Performed at [facility], [place] [address], [place], [place] [address]  Differential     Status: Abnormal   Collection Time: 10/15/18  9:03 AM  Result Value Ref Range   Neutrophils Relative % 90 %   Neutro Abs 13.4 (H) 1.7 - 7.7 K/uL   Lymphocytes Relative 5 %   Lymphs Abs 0.7 0.7 - 4.0 K/uL   Monocytes Relative 4 %   Monocytes Absolute 0.5 0.1 - 1.0 K/uL   Eosinophils Relative 0 %   Eosinophils Absolute 0.0 0.0 - 0.5 K/uL   Basophils Relative 0 %   Basophils Absolute 0.0 0.0 - 0.1 K/uL   Immature Granulocytes 1 %   Abs Immature Granulocytes 0.08 (H) 0.00 -  0.07 K/uL    Comment: Performed at [facility], [place] [address], [place], [place] [address]  Comprehensive metabolic panel     Status: Abnormal   Collection Time: 10/15/18  9:03 AM  Result Value Ref Range   Sodium 138 135 - 145 mmol/L   Potassium 3.3 (L) 3.5 - 5.1 mmol/L   Chloride 98 98 - 111 mmol/L   CO2 26 22 - 32 mmol/L   Glucose, Bld 150 (H) 70 - 99 mg/dL   BUN 12 8 - 23 mg/dL   Creatinine, Ser 1.12 (H) 0.44 - 1.00 mg/dL   Calcium 9.9 8.9 - 10.3 mg/dL   Total Protein 7.5 6.5 - 8.1 g/dL   Albumin 4.3 3.5 - 5.0 g/dL   AST 36 15 - 41 U/L   ALT 20 0 - 44 U/L   Alkaline Phosphatase 94 38 - 126 U/L   Total Bilirubin 1.2 0.3 - 1.2 mg/dL   GFR calc non Af Amer 44 (L) >60 mL/min   GFR calc Af Amer 50 (L) >60 mL/min   Anion gap 14 5 - 15    Comment: Performed at [facility] [address] [place], [place] [address]  CBG monitoring, ED     Status: Abnormal   Collection Time: 10/15/18  9:05 AM  Result Value Ref Range   Glucose-Capillary 143 (H) 70 - 99 mg/dL  Lactic acid, plasma     Status: None    Collection Time: 10/15/18  9:11 AM  Result Value Ref Range   Lactic Acid, Venous 1.0 0.5 - 1.9 mmol/L    Comment: Performed at [facility] [address], [place], [place] [address]  I-stat chem 8, ED [facility] and [facility] only)     Status: Abnormal   Collection Time: 10/15/18  9:13 AM  Result Value Ref Range   Sodium 137 135 - 145 mmol/L   Potassium 3.2 (L) 3.5 - 5.1 mmol/L   Chloride 100 98 - 111 mmol/L   BUN 14 8 - 23 mg/dL   Creatinine, Ser 1.00 0.44 - 1.00 mg/dL   Glucose, Bld 152 (H) 70 - 99 mg/dL   Calcium, Ion 1.12 (L) 1.15 - 1.40 mmol/L   TCO2 25 22 - 32 mmol/L   Hemoglobin 15.3 (H) 12.0 - 15.0 g/dL   HCT 45.0 36.0 - 46.0 %  SARS Coronavirus 2 (CEPHEID- Performed in [facility] hospital lab), Hosp Order     Status: None   Collection Time: 10/15/18  9:42 AM  Result Value Ref Range   SARS Coronavirus 2 NEGATIVE NEGATIVE    Comment: (NOTE) If result is NEGATIVE SARS-CoV-2 target nucleic acids are NOT DETECTED. The SARS-CoV-2 RNA is generally detectable in upper and lower  respiratory specimens during the acute phase of infection. The lowest  concentration of SARS-CoV-2 viral copies this assay can detect is 250  copies / mL. A negative result does not preclude SARS-CoV-2 infection  and should not be used as the sole basis for treatment or other  patient management decisions.  A negative result may occur with  improper specimen collection / handling, submission of specimen other  than nasopharyngeal swab, presence of viral mutation(s) within the  areas targeted by this assay, and inadequate number of viral copies  (<250 copies / mL). A negative result must be combined with clinical  observations, patient history, and epidemiological information. If result is POSITIVE SARS-CoV-2 target nucleic acids are DETECTED. The SARS-CoV-2 RNA is generally detectable in  upper and lower  respiratory specimens dur ing the acute phase of infection.  Positive  results are indicative of active  infection with SARS-CoV-2.  Clinical  correlation with patient history and other diagnostic information is  necessary to determine patient infection status.  Positive results do  not rule out bacterial infection or co-infection with other viruses. If result is PRESUMPTIVE POSTIVE SARS-CoV-2 nucleic acids MAY BE PRESENT.   A presumptive positive result was obtained on the submitted specimen  and confirmed on repeat testing.  While 2019 novel coronavirus  (SARS-CoV-2) nucleic acids may be present in the submitted sample  additional confirmatory testing may be necessary for epidemiological  and / or clinical management purposes  to differentiate between  SARS-CoV-2 and other Sarbecovirus currently known to infect humans.  If clinically indicated additional testing with an alternate test  methodology [phone]) is advised. The SARS-CoV-2 RNA is generally  detectable in upper and lower respiratory sp ecimens during the acute  phase of infection. The expected result is Negative. Fact Sheet for Patients:  StrictlyIdeas.no Fact Sheet for Healthcare Providers: BankingDealers.co.za This test is not yet approved or cleared by the [place] FDA and has been authorized for detection and/or diagnosis of SARS-CoV-2 by FDA under an Emergency Use Authorization (EUA).  This EUA will remain in effect (meaning this test can be used) for the duration of the COVID-19 declaration under Section 564(b)(1) of the Act, 21 U.S.C. section 360bbb-3(b)(1), unless the authorization is terminated or revoked sooner. Performed at [facility], [place] [address] [place], [place] [address]   POCT I-Stat EG7     Status: Abnormal   Collection Time: 10/15/18 10:29 AM  Result Value Ref Range   pH, Ven 7.392 7.250 - 7.430   pCO2, Ven 43.1 (L) 44.0 - 60.0 mmHg   pO2, Ven 45.0 32.0 - 45.0 mmHg   Bicarbonate 26.2 20.0 - 28.0 mmol/L   TCO2 28 22 - 32 mmol/L   O2  Saturation 80.0 %   Acid-Base Excess 1.0 0.0 - 2.0 mmol/L   Sodium 138 135 - 145 mmol/L   Potassium 3.1 (L) 3.5 - 5.1 mmol/L   Calcium, Ion 1.14 (L) 1.15 - 1.40 mmol/L   HCT 46.0 36.0 - 46.0 %   Hemoglobin 15.6 (H) 12.0 - 15.0 g/dL   Patient temperature HIDE    Sample type VENOUS   Lactic acid, plasma     Status: Abnormal   Collection Time: 10/15/18 11:56 AM  Result Value Ref Range   Lactic Acid, Venous <0.3 (L) 0.5 - 1.9 mmol/L    Comment: RESULT REPEATED AND VERIFIED Performed at [facility], [address] [place], [place] [address]   Magnesium     Status: None   Collection Time: 10/15/18  2:37 PM  Result Value Ref Range   Magnesium 1.9 1.7 - 2.4 mg/dL    Comment: Performed at [facility], [place] [address], [place], [place] [address]  TSH     Status: None   Collection Time: 10/15/18  2:37 PM  Result Value Ref Range   TSH 0.600 0.350 - 4.500 uIU/mL    Comment: Performed by a 3rd Generation assay with a functional sensitivity of <=0.01 uIU/mL. Performed at [facility], [place] [address], [place], [place] [address]   Glucose, capillary     Status: Abnormal   Collection Time: 10/15/18  5:03 PM  Result Value Ref Range   Glucose-Capillary 143 (H) 70 - 99 mg/dL  Hemoglobin A1c     Status: Abnormal   Collection Time: 10/16/18  6:25 AM  Result Value Ref Range   Hgb A1c MFr Bld 6.0 (H) 4.8 - 5.6 %    Comment: (NOTE) Pre diabetes:          5.7%-6.4% Diabetes:              >6.4% Glycemic control for   <7.0% adults with diabetes    Mean Plasma Glucose 125.5 mg/dL    Comment: Performed at [facility] [address], [place], [place] [address]  Lipid panel     Status: None   Collection Time: 10/16/18  6:25 AM  Result Value Ref Range   Cholesterol 146 0 - 200 mg/dL   Triglycerides 97 <150 mg/dL   HDL 61 >40 mg/dL   Total CHOL/HDL Ratio 2.4 RATIO   VLDL 19 0 - 40 mg/dL   LDL Cholesterol 66 0 - 99 mg/dL    Comment:        Total Cholesterol/HDL:CHD  Risk Coronary Heart Disease Risk Table                     Men   Women  1/2 Average Risk   3.4   3.3  Average Risk       5.0   4.4  2 X Average Risk   9.6   7.1  3 X Average Risk  23.4   11.0        Use the calculated Patient Ratio above and the CHD Risk Table to determine the patient's CHD Risk.        ATP III CLASSIFICATION (LDL):  <100     mg/dL   Optimal  100-129  mg/dL   Near or Above                    Optimal  130-159  mg/dL   Borderline  160-189  mg/dL   High  >190     mg/dL   Very High Performed at [facility] [address] [place], [place] [address]     Recent Results (from the past 240 hour(s))  SARS Coronavirus 2 (CEPHEID- Performed in [facility] hospital lab), Hosp Order     Status: None   Collection Time: 10/15/18  9:42 AM  Result Value Ref Range Status   SARS Coronavirus 2 NEGATIVE NEGATIVE Final    Comment: (NOTE) If result is NEGATIVE SARS-CoV-2 target nucleic acids are NOT DETECTED. The SARS-CoV-2 RNA is generally detectable in upper and lower  respiratory specimens during the acute phase of infection. The lowest  concentration of SARS-CoV-2 viral copies this assay can detect is 250  copies / mL. A negative result does not preclude SARS-CoV-2 infection  and should not be used as the sole basis for treatment or other  patient management decisions.  A negative result may occur with  improper specimen collection / handling, submission of specimen other  than nasopharyngeal swab, presence of viral mutation(s) within the  areas targeted by this assay, and inadequate number of viral copies  (<250 copies / mL). A negative result must be combined with clinical  observations, patient history, and epidemiological information. If result is POSITIVE SARS-CoV-2 target nucleic acids are DETECTED. The SARS-CoV-2 RNA is generally detectable in upper and lower  respiratory specimens dur ing the acute phase of infection.  Positive  results are indicative of  active infection with SARS-CoV-2.  Clinical  correlation with patient history and other diagnostic information is  necessary to determine patient infection status.  Positive results do  not rule out bacterial infection or co-infection with other viruses. If result is PRESUMPTIVE POSTIVE SARS-CoV-2 nucleic acids MAY BE PRESENT.   A presumptive positive result was obtained on the submitted specimen  and confirmed on repeat testing.  While 2019 novel coronavirus  (SARS-CoV-2) nucleic acids may be present in the submitted sample  additional confirmatory testing may be necessary for epidemiological  and / or clinical management purposes  to differentiate between  SARS-CoV-2 and other Sarbecovirus currently known to infect humans.  If clinically indicated additional testing with an alternate test  methodology [phone]) is advised. The SARS-CoV-2 RNA is generally  detectable in upper and lower respiratory sp ecimens during the acute  phase of infection. The expected result is Negative. Fact Sheet for Patients:  StrictlyIdeas.no Fact Sheet for Healthcare Providers: BankingDealers.co.za This test is not yet approved or cleared by the [place] FDA and has been authorized for detection and/or diagnosis of SARS-CoV-2 by FDA under an Emergency Use Authorization (EUA).  This EUA will remain in effect (meaning this test can be used) for the duration of the COVID-19 declaration under Section 564(b)(1) of the Act, 21 U.S.C. section 360bbb-3(b)(1), unless the authorization is terminated or revoked sooner. Performed at [facility], [place] [address], [place], [place] [address]     Lipid Panel Recent Labs    10/16/18 0625  CHOL 146  TRIG 97  HDL 61  CHOLHDL 2.4  VLDL 19  LDLCALC 66    Studies/Results: Ct Code Stroke Cta Head W/wo Contrast  Result Date: 10/15/2018 CLINICAL DATA:  Code stroke. Last seen normal at 6 o'clock p.m. last  night. Aphasia. Left-sided weakness. EXAM: CT ANGIOGRAPHY HEAD AND NECK CT PERFUSION BRAIN TECHNIQUE: Multidetector CT imaging of the head and neck was performed using the standard protocol during bolus administration of intravenous contrast. Multiplanar CT image reconstructions and MIPs were obtained to evaluate the vascular anatomy. Carotid stenosis measurements (when applicable) are obtained utilizing NASCET criteria, using the distal internal carotid diameter as the denominator. Multiphase CT imaging of the brain was performed following IV bolus contrast injection. Subsequent parametric perfusion maps were calculated using RAPID software. CONTRAST:  115mL OMNIPAQUE IOHEXOL 350 MG/ML SOLN COMPARISON:  CT head without contrast 10/15/2018. FINDINGS: CTA NECK FINDINGS Aortic arch: Extensive vascular calcifications are present at the aortic arch and origins of the great vessels. A 3 vessel arch configuration is present. There dense calcifications at the origin of the left common carotid artery with greater than 60% stenosis proximally. There is no significant stenosis at the origin of the innominate or of the left subclavian artery. There is no aneurysm. Right carotid system: Right common carotid artery demonstrates some proximal calcifications from the innominate. There distal mural calcifications proximal bifurcation. Extensive calcified plaque is present at the right carotid bifurcation without a significant stenosis of greater than 50% relative to the more distal vessel. The more distal right ICA is normal. Left carotid system: The left common carotid artery demonstrates the proximal stenosis. Additional mural calcifications are present without other tandem stenoses. Extensive vascular calcifications are present at the left carotid bifurcation without a significant stenosis of greater than 50% relative to the more distal vessel. The more distal left ICA is normal. Vertebral arteries: The vertebral arteries are  codominant. Calcifications are present in the subclavian arteries bilaterally without significant vertebral artery stenoses. There is no significant vertebral  artery stenosis or vascular injury in the neck. Skeleton: Endplate degenerative changes and uncovertebral spurring is worse right than left at C5-6 and C6-7. Extensive facet changes are also worse on the right, more in the upper cervical spine. No focal lytic or blastic lesions are present. The patient is status post median sternotomy. Vertebral body heights are maintained. Calvarium is intact. Other neck: No focal mucosal or submucosal lesions are present. Thyroid and salivary glands are normal. There is no significant adenopathy. Upper chest: Centrilobular emphysematous changes are present. There is no focal nodule, mass, or airspace disease. Review of the MIP images confirms the above findings CTA HEAD FINDINGS Anterior circulation: Posterior circulation: Atherosclerotic calcifications are present within the cavernous internal carotid arteries bilaterally. There is no significant stenosis of either vessel relative to the ICA terminus. The A1 and M1 segments are normal. The anterior communicating artery is patent. MCA bifurcations are intact bilaterally. There is asymmetric attenuation of distal left MCA branch vessels compared to the right. No significant proximal stenosis or occlusion is present. Mild distal irregularity is present in the ACA branches bilaterally. Venous sinuses: Atherosclerotic calcifications are present at the dural margin of both vertebral arteries. There is a moderate left V3 segment stenosis. Vertebrobasilar junction is normal. PICA origins are visualized and normal. Basilar artery is normal. Both posterior cerebral arteries originate from the basilar tip. There is some attenuation of distal PCA branch vessels without significant proximal stenosis or occlusion. Anatomic variants: None Delayed phase: Review of the MIP images confirms  the above findings CT Brain Perfusion Findings: ASPECTS: 10/10 CBF (<30%) Volume: 75mL Perfusion (Tmax>6.0s) volume: 60mL Mismatch Volume: 12mL Infarction Location:There is asymmetric ischemia and watershed distribution over the left convexity. No infarct is present. IMPRESSION: 1. Asymmetric decreased perfusion to the left hemisphere evident by decreased cerebral blood flow in a watershed distribution and decreased collateral vessels visible on the CTA. 2. The most significant proximal stenosis on the left is in the proximal left common carotid artery near the origin. There is a stenosis of at least 60%. 3. Extensive atherosclerotic changes are present at the left carotid bifurcation and left cavernous internal carotid artery without a significant stenosis of greater than 50%. 4. No large vessel occlusion. 5. Moderate left vertebral artery stenosis at the V3 segment. 6. Other significant atherosclerotic calcifications as described without other significant stenoses. 7. Moderate degenerative changes in the cervical spine. These results were called by telephone at the time of interpretation on 10/15/2018 at 9:54 am to Dr. Kerney Elbe , who verbally acknowledged these results. Electronically Signed   By: San Morelle M.D.   On: 10/15/2018 10:03   Ct Code Stroke Cta Neck W/wo Contrast  Result Date: 10/15/2018 CLINICAL DATA:  Code stroke. Last seen normal at 6 o'clock p.m. last night. Aphasia. Left-sided weakness. EXAM: CT ANGIOGRAPHY HEAD AND NECK CT PERFUSION BRAIN TECHNIQUE: Multidetector CT imaging of the head and neck was performed using the standard protocol during bolus administration of intravenous contrast. Multiplanar CT image reconstructions and MIPs were obtained to evaluate the vascular anatomy. Carotid stenosis measurements (when applicable) are obtained utilizing NASCET criteria, using the distal internal carotid diameter as the denominator. Multiphase CT imaging of the brain was performed  following IV bolus contrast injection. Subsequent parametric perfusion maps were calculated using RAPID software. CONTRAST:  171mL OMNIPAQUE IOHEXOL 350 MG/ML SOLN COMPARISON:  CT head without contrast 10/15/2018. FINDINGS: CTA NECK FINDINGS Aortic arch: Extensive vascular calcifications are present at the aortic arch and origins of the  great vessels. A 3 vessel arch configuration is present. There dense calcifications at the origin of the left common carotid artery with greater than 60% stenosis proximally. There is no significant stenosis at the origin of the innominate or of the left subclavian artery. There is no aneurysm. Right carotid system: Right common carotid artery demonstrates some proximal calcifications from the innominate. There distal mural calcifications proximal bifurcation. Extensive calcified plaque is present at the right carotid bifurcation without a significant stenosis of greater than 50% relative to the more distal vessel. The more distal right ICA is normal. Left carotid system: The left common carotid artery demonstrates the proximal stenosis. Additional mural calcifications are present without other tandem stenoses. Extensive vascular calcifications are present at the left carotid bifurcation without a significant stenosis of greater than 50% relative to the more distal vessel. The more distal left ICA is normal. Vertebral arteries: The vertebral arteries are codominant. Calcifications are present in the subclavian arteries bilaterally without significant vertebral artery stenoses. There is no significant vertebral artery stenosis or vascular injury in the neck. Skeleton: Endplate degenerative changes and uncovertebral spurring is worse right than left at C5-6 and C6-7. Extensive facet changes are also worse on the right, more in the upper cervical spine. No focal lytic or blastic lesions are present. The patient is status post median sternotomy. Vertebral body heights are maintained.  Calvarium is intact. Other neck: No focal mucosal or submucosal lesions are present. Thyroid and salivary glands are normal. There is no significant adenopathy. Upper chest: Centrilobular emphysematous changes are present. There is no focal nodule, mass, or airspace disease. Review of the MIP images confirms the above findings CTA HEAD FINDINGS Anterior circulation: Posterior circulation: Atherosclerotic calcifications are present within the cavernous internal carotid arteries bilaterally. There is no significant stenosis of either vessel relative to the ICA terminus. The A1 and M1 segments are normal. The anterior communicating artery is patent. MCA bifurcations are intact bilaterally. There is asymmetric attenuation of distal left MCA branch vessels compared to the right. No significant proximal stenosis or occlusion is present. Mild distal irregularity is present in the ACA branches bilaterally. Venous sinuses: Atherosclerotic calcifications are present at the dural margin of both vertebral arteries. There is a moderate left V3 segment stenosis. Vertebrobasilar junction is normal. PICA origins are visualized and normal. Basilar artery is normal. Both posterior cerebral arteries originate from the basilar tip. There is some attenuation of distal PCA branch vessels without significant proximal stenosis or occlusion. Anatomic variants: None Delayed phase: Review of the MIP images confirms the above findings CT Brain Perfusion Findings: ASPECTS: 10/10 CBF (<30%) Volume: 76mL Perfusion (Tmax>6.0s) volume: 8mL Mismatch Volume: 57mL Infarction Location:There is asymmetric ischemia and watershed distribution over the left convexity. No infarct is present. IMPRESSION: 1. Asymmetric decreased perfusion to the left hemisphere evident by decreased cerebral blood flow in a watershed distribution and decreased collateral vessels visible on the CTA. 2. The most significant proximal stenosis on the left is in the proximal left  common carotid artery near the origin. There is a stenosis of at least 60%. 3. Extensive atherosclerotic changes are present at the left carotid bifurcation and left cavernous internal carotid artery without a significant stenosis of greater than 50%. 4. No large vessel occlusion. 5. Moderate left vertebral artery stenosis at the V3 segment. 6. Other significant atherosclerotic calcifications as described without other significant stenoses. 7. Moderate degenerative changes in the cervical spine. These results were called by telephone at the time of interpretation on  10/15/2018 at 9:54 am to Dr. Kerney Elbe , who verbally acknowledged these results. Electronically Signed   By: San Morelle M.D.   On: 10/15/2018 10:03   Ct Angio Chest Pe W And/or Wo Contrast  Result Date: 10/15/2018 CLINICAL DATA:  Respiratory distress. EXAM: CT ANGIOGRAPHY CHEST WITH CONTRAST TECHNIQUE: Multidetector CT imaging of the chest was performed using the standard protocol during bolus administration of intravenous contrast. Multiplanar CT image reconstructions and MIPs were obtained to evaluate the vascular anatomy. CONTRAST:  71mL OMNIPAQUE IOHEXOL 350 MG/ML SOLN COMPARISON:  Radiograph of same day.  CT scan of January 06, 2009. FINDINGS: Cardiovascular: Satisfactory opacification of the pulmonary arteries to the segmental level. No evidence of pulmonary embolism. Normal heart size. No pericardial effusion. Status post coronary bypass graft. Atherosclerosis of thoracic aorta is noted. Mediastinum/Nodes: Moderate size sliding-type hiatal hernia is noted. No adenopathy is noted. Thyroid gland is unremarkable. Lungs/Pleura: Lungs are clear. No pleural effusion or pneumothorax. Upper Abdomen: No acute abnormality. Musculoskeletal: No chest wall abnormality. No acute or significant osseous findings. Review of the MIP images confirms the above findings. IMPRESSION: No definite evidence of pulmonary embolus. Moderate size sliding-type  hiatal hernia. Aortic Atherosclerosis (ICD10-I70.0). Electronically Signed   By: Marijo Conception M.D.   On: 10/15/2018 13:25   Mr Brain Wo Contrast  Result Date: 10/16/2018 CLINICAL DATA:  Left-sided weakness and aphasia. EXAM: MRI HEAD WITHOUT CONTRAST TECHNIQUE: Multiplanar, multiecho pulse sequences of the brain and surrounding structures were obtained without intravenous contrast. COMPARISON:  CT and CTA earlier today. FINDINGS: Brain: DWI hyperintensity with isointensity on ADC map (shine through) at the medial right thalamus. Even more subtle diffusion hyperintensity along the parasagittal right frontal lobe involving a limited area. Given this pattern, seizure phenomenon is favored. CT perfusion findings may be related to increased flow on the right rather than decreased on the left. No history of fever to imply an encephalitis. There is bilateral medial temporal volume loss with no evidence of temporal or insular edema to imply herpes. Small remote bilateral cerebellar infarcts. Chronic microvascular ischemia in the cerebral white matter. There is cerebral volume loss most notable in the medial temporal lobes in this patient with history of dementia. No acute hemorrhage, mass, or collection. Vascular: Major flow voids are preserved. Basilar and right PCA were widely patent on prior CTA Skull and upper cervical spine: Negative for marrow lesion Sinuses/Orbits: Retention cysts in the left maxillary sinus. Right cataract resection. Other: Significantly and progressively motion degraded study which could easily obscure findings. IMPRESSION: 1. Edematous signal in the medial right thalamus and possibly in the parasagittal right frontal lobe, favor seizure phenomenon over ischemia. Please ensure no clinical signs of CNS infection. 2. Atrophy in keeping with history of dementia. There is mild chronic small vessel ischemia in the cerebral white matter. Small remote cerebellar infarcts. 3. Significantly motion  degraded study. Electronically Signed   By: Monte Fantasia M.D.   On: 10/16/2018 04:21   Ct Code Stroke Cerebral Perfusion With Contrast  Result Date: 10/15/2018 CLINICAL DATA:  Code stroke. Last seen normal at 6 o'clock p.m. last night. Aphasia. Left-sided weakness. EXAM: CT ANGIOGRAPHY HEAD AND NECK CT PERFUSION BRAIN TECHNIQUE: Multidetector CT imaging of the head and neck was performed using the standard protocol during bolus administration of intravenous contrast. Multiplanar CT image reconstructions and MIPs were obtained to evaluate the vascular anatomy. Carotid stenosis measurements (when applicable) are obtained utilizing NASCET criteria, using the distal internal carotid diameter as the denominator.  Multiphase CT imaging of the brain was performed following IV bolus contrast injection. Subsequent parametric perfusion maps were calculated using RAPID software. CONTRAST:  122mL OMNIPAQUE IOHEXOL 350 MG/ML SOLN COMPARISON:  CT head without contrast 10/15/2018. FINDINGS: CTA NECK FINDINGS Aortic arch: Extensive vascular calcifications are present at the aortic arch and origins of the great vessels. A 3 vessel arch configuration is present. There dense calcifications at the origin of the left common carotid artery with greater than 60% stenosis proximally. There is no significant stenosis at the origin of the innominate or of the left subclavian artery. There is no aneurysm. Right carotid system: Right common carotid artery demonstrates some proximal calcifications from the innominate. There distal mural calcifications proximal bifurcation. Extensive calcified plaque is present at the right carotid bifurcation without a significant stenosis of greater than 50% relative to the more distal vessel. The more distal right ICA is normal. Left carotid system: The left common carotid artery demonstrates the proximal stenosis. Additional mural calcifications are present without other tandem stenoses. Extensive  vascular calcifications are present at the left carotid bifurcation without a significant stenosis of greater than 50% relative to the more distal vessel. The more distal left ICA is normal. Vertebral arteries: The vertebral arteries are codominant. Calcifications are present in the subclavian arteries bilaterally without significant vertebral artery stenoses. There is no significant vertebral artery stenosis or vascular injury in the neck. Skeleton: Endplate degenerative changes and uncovertebral spurring is worse right than left at C5-6 and C6-7. Extensive facet changes are also worse on the right, more in the upper cervical spine. No focal lytic or blastic lesions are present. The patient is status post median sternotomy. Vertebral body heights are maintained. Calvarium is intact. Other neck: No focal mucosal or submucosal lesions are present. Thyroid and salivary glands are normal. There is no significant adenopathy. Upper chest: Centrilobular emphysematous changes are present. There is no focal nodule, mass, or airspace disease. Review of the MIP images confirms the above findings CTA HEAD FINDINGS Anterior circulation: Posterior circulation: Atherosclerotic calcifications are present within the cavernous internal carotid arteries bilaterally. There is no significant stenosis of either vessel relative to the ICA terminus. The A1 and M1 segments are normal. The anterior communicating artery is patent. MCA bifurcations are intact bilaterally. There is asymmetric attenuation of distal left MCA branch vessels compared to the right. No significant proximal stenosis or occlusion is present. Mild distal irregularity is present in the ACA branches bilaterally. Venous sinuses: Atherosclerotic calcifications are present at the dural margin of both vertebral arteries. There is a moderate left V3 segment stenosis. Vertebrobasilar junction is normal. PICA origins are visualized and normal. Basilar artery is normal. Both  posterior cerebral arteries originate from the basilar tip. There is some attenuation of distal PCA branch vessels without significant proximal stenosis or occlusion. Anatomic variants: None Delayed phase: Review of the MIP images confirms the above findings CT Brain Perfusion Findings: ASPECTS: 10/10 CBF (<30%) Volume: 52mL Perfusion (Tmax>6.0s) volume: 12mL Mismatch Volume: 56mL Infarction Location:There is asymmetric ischemia and watershed distribution over the left convexity. No infarct is present. IMPRESSION: 1. Asymmetric decreased perfusion to the left hemisphere evident by decreased cerebral blood flow in a watershed distribution and decreased collateral vessels visible on the CTA. 2. The most significant proximal stenosis on the left is in the proximal left common carotid artery near the origin. There is a stenosis of at least 60%. 3. Extensive atherosclerotic changes are present at the left carotid bifurcation and left cavernous internal  carotid artery without a significant stenosis of greater than 50%. 4. No large vessel occlusion. 5. Moderate left vertebral artery stenosis at the V3 segment. 6. Other significant atherosclerotic calcifications as described without other significant stenoses. 7. Moderate degenerative changes in the cervical spine. These results were called by telephone at the time of interpretation on 10/15/2018 at 9:54 am to Dr. Kerney Elbe , who verbally acknowledged these results. Electronically Signed   By: San Morelle M.D.   On: 10/15/2018 10:03   Dg Chest Portable 1 View  Result Date: 10/15/2018 CLINICAL DATA:  Left-sided weakness vomiting.  Possible aspiration. EXAM: PORTABLE CHEST 1 VIEW COMPARISON:  Radiographs 05/05/2016 and 04/24/2016 FINDINGS: 1032 hours. The heart size and mediastinal contours are stable status post median sternotomy and CABG. There is aortic atherosclerosis. The lungs are clear. There is no pleural effusion or pneumothorax. No acute osseous  findings are evident. Prominent bilateral breast capsular calcifications are noted. Telemetry leads overlie the chest. IMPRESSION: Stable chest.  No active cardiopulmonary process. Electronically Signed   By: Richardean Sale M.D.   On: 10/15/2018 10:45   Ct Head Code Stroke Wo Contrast  Result Date: 10/15/2018 CLINICAL DATA:  Code stroke.  Left-sided paralysis and aphasia. EXAM: CT HEAD WITHOUT CONTRAST TECHNIQUE: Contiguous axial images were obtained from the base of the skull through the vertex without intravenous contrast. 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 Chronic small-vessel ischemic changes of the pons. Generalized age related atrophy. Chronic small-vessel ischemic changes of the hemispheric white matter. No sign of acute cortical infarction. No mass lesion, hemorrhage, hydrocephalus or extra-axial collection. Vascular: There is atherosclerotic calcification of the major vessels at the base of the brain. Skull: Negative Sinuses/Orbits: Clear/normal Other: None ASPECTS ([place] Stroke Program Early CT Score) - Ganglionic level infarction (caudate, lentiform nuclei, internal capsule, insula, M1-M3 cortex): 7 - Supraganglionic infarction (M4-M6 cortex): 3 Total score (0-10 with 10 being normal): 10 IMPRESSION: 1. No acute finding by CT. Atrophy and chronic small-vessel ischemic changes. 2. ASPECTS is 10. 3. These results were communicated to Dr. Cheral Marker at [facility] 5/26/2020by text page via the [facility] messaging system. Electronically Signed   By: Nelson Chimes M.D.   On: 10/15/2018 09:23    Medications:  Scheduled: . aspirin  300 mg Rectal Daily  . enoxaparin (LOVENOX) injection  1 mg/kg Subcutaneous Q24H  . pantoprazole (PROTONIX) IV  40 mg Intravenous Q24H  . sodium chloride flush  3 mL Intravenous Once   Continuous: . sodium chloride 50 mL/hr at 10/16/18 0610  . levETIRAcetam      Assessment/Recommendations: 85 year old female with  acute onset of left sided weakness and aphasia. Initially thought to be most likely secondary to stroke, the overall clinical picture is now more consistent with new onset seizures versus NCSE, with seizure-related weakness and confusion.  1. Neurological exam in the ED on the day of admission best localized as focal right cerebral hemisphere dysfunction. There was no hemorrhage on CT head and no LVO on CTA. CTP was of equivocal significance, with possible watershed distribution hypoperfusion per official Radiology report.   2. Stroke is now considered less likely, with seizure most likely to be the underlying etiology for her presentation, as MRI obtained overnight was concerning for seizure related changes - they involve the medial right thalamus and medial anterior right frontal cortices on DWI, appearing most consistent with cytotoxic edema. On review of CTP, Dr. Leonel Ramsay suspected that the abnormal perfusion finding actually  represented INCREASED perfusion of the right as opposed to decrease on the left, also concerning for seizure. I have reviewed the MRI and CTP and agree with Dr. Leonel Ramsay. The patient was loaded with Keppra 3000 mg x 1 and started on 1000 mg BID. An EEG has been ordered to rule out NCSE.  Addendum: EEG preliminary review at the bedside reveals diffuse delta slowing with some rhythmicity in portions of the record. The findings are most prominent in the anterior leads. Will load with valproic acid 1500 mg and start scheduled VPA at 5 mg/kg IV TID. Obtaining ammonia level. LTM EEG ordered and EEG technician notified. Await final EEG report.     LOS: 1 day   @Electronically  signed: Dr. Kerney Elbe 10/16/2018  9:35 AM

## 2018-10-16 NOTE — Progress Notes (Signed)
SLP Cancellation Note  Patient Details Name: [name] MRN: [id number] DOB: 15-Apr-1927   Cancelled treatment:       Reason Eval/Treat Not Completed: Patient's level of consciousness. Patient asleep and unable to be aroused. Will attempt next date.   Nadara Mode Tarrell 10/16/2018, 4:08 PM  Sonia Baller, MA, CCC-SLP Speech Therapy [facility] Acute Rehab Pager: [phone]

## 2018-10-16 NOTE — Progress Notes (Signed)
LTM started  Notified Neurology

## 2018-10-16 NOTE — Progress Notes (Addendum)
MRI concerning for possible seizure related changes. On review of CTP, suspect actually represents INCREASED perfusion of the right as opposed to decrease on the left, also concerning for seizure.   I will load with keppra, get EEG.   On exam, she remains with left sided deficits, no nystagmus.   Roland Rack, MD [facility] [phone]  If 7pm- 7am, please page neurology on call as listed in [place].

## 2018-10-16 NOTE — Progress Notes (Signed)
PROGRESS NOTE    [name]  BMW:[id number] DOB: [birth year] DOA: 10/15/2018 PCP: Mayra Neer, MD    Brief Narrative:   [name] is a 85 y.o. female with medical history significant of A. fib not on anticoagulation, systolic CHF, CAD s/p CABG, dementia, DM type II, PVD; who presented after being found by son this morning around 8 AM in bed unable to talk and with left-sided weakness.  History is obtained from the son over the phone as patient unable to give history at this time due to her condition.  She had last been seen normal around 6 PM the night before.  At baseline the patient had been living alone, but her son will check on her twice daily making sure she had meals and her medications.  When he arrived this morning he also noted that she had vomited.  He reports that she had previously been on Coumadin, but he does not recall why she was taken off of it.   ED Course: Upon admission to the emergency department patient was seen to have a temperature of 100.3 F pulse 76-123, respiration 15-22, blood pressure elevated up to 194/87, and O2 saturation maintained on 2 L of nasal cannula oxygen.  Labs revealed WBC 14.8, potassium 3.1, BUN 12, creatinine 1.12, and lactic acid 1.  Chest x-ray showed no acute abnormalities.  Patient had been given Zosyn as there was initial concern for aspiration and 2.5 mg of metoprolol IV for rate control.  Neurology have been consulted.  TRH called to admit.   Assessment & Plan:   Principal Problem:   CVA (cerebral vascular accident) ([place]) Active Problems:   Type 2 diabetes mellitus with vascular disease ([place])   Dementia ([place])   Hypokalemia   Acute respiratory failure with hypoxia ([facility])   Acute metabolic encephalopathy Altered mental status CVA versus nonconvulsive status epilepticus Patient presenting from home with left-sided weakness and aphasia.  CT cerebral perfusion/angio head/neck scan on admission notable for asymmetric  decreased perfusion to left hemisphere, left common carotid artery stenosis 60%, no large vessel occlusion, moderate left vertebral artery stenosis at V3 segment.  MR brain with edematous signal medial right thalamus and possibly parasagittal right frontal lobe which favors seizure phenomenon versus ischemia. --Neurology following, appreciate assistance --Loaded on Keppra 3000 mg IV on 5/26, continues Keppra 1000 mg IV q12h --Aspirin 300 mg per rectum daily --EEG pending --PT/OT following, recommend SNF --Speech following, recommend n.p.o. as she failed swallow evaluation --NS at 45mL/hr --Palliative care consulted for assistance with goals of care and medical decision making  Acute respiratory failure with hypoxia: Resolved   Patient presented with new oxygen requirement of 2 L nasal cannula oxygen to maintain O2 saturations.  Chest x-ray otherwise clear.  Question possible pulmonary embolus, but CT angiogram of the chest was negative for a PE or signs of pneumonia.  Patient had been given Zosyn x1 dose for suspected aspiration. --Supplemental oxygen now titrated off --Continue to monitor oxygen requirements closely --Incentive spirometry  Paroxysmal atrial fibrillation  Heart rates on admission into the 120s.  Given 2.5 mg of metoprolol IV with improvement in rate.  CHA2DS2-VASc score = 8(CHF, HTN, DM, CVA, age, sex). Remote history of being on anticoagulation, but appears to have been discontinued due to dementia, previous report of fall, and social situation of living alone in 02/2016.  TSH within normal limits. --Currently on treatment dose Lovenox 1 mg/kg daily --Metoprolol IV as needed for rate control  --Continue to monitor  on telemetry  Hypokalemia: Acute   Potassium noted to be as low as 3.1. --Given 40 mEq of potassium chloride IV --Monitor daily electrolytes with magnesium  Systolic CHF: Chronic  Hx CAD with CABG x4 and left main s/p PCI/stent  Last EF noted to be 45 to  50% in 02/2016.  Patient does not appear to be fluid overloaded at this time.  Home regimen includes aspirin, statin, furosemide 20 mg p.o. daily, Imdur 180mg  PO daily, metoprolol tartrate 25 mg p.o. twice daily. --TTE echo pending  Diabetes mellitus type 2:   On admission glucose 151.  Patient appears to be diet-controlled at baseline. Hemoglobin A1C 6.0 --Continue to monitor for now  GERD --Protonix IV   DVT prophylaxis: Lovenox 1 mg/kg Code Status: DNR Family Communication: updated son [name] by telephone today Disposition Plan: Inpatient hospitalization, further depending on clinical course   Consultants:   Neurology  Procedures:   Transthoracic echocardiogram: Pending  Antimicrobials:   Zosyn x 1 dose on admission   Subjective: Patient seen and examined at bedside this morning, continues to be unresponsive to verbal command or painful stimulus.  No significant change per nursing staff.  Neurology believes patient may be experiencing nonconvulsive status epilepticus.  EEG pending.  Continues on IV Keppra.  No further ROS obtained due to current status.  Objective: Vitals:   10/15/18 2215 10/16/18 0015 10/16/18 0447 10/16/18 0748  BP: (!) 164/89 (!) 153/82 (!) 127/95 (!) 157/75  Pulse: 91 (!) 107 (!) 106 84  Resp: 20 20 20 16   Temp: 98.9 F (37.2 C) 99.2 F (37.3 C) 98.9 F (37.2 C) 98.9 F (37.2 C)  TempSrc: Oral Oral Axillary Oral  SpO2: 96% 95% 95% 99%  Weight:      Height:        Intake/Output Summary (Last 24 hours) at 10/16/2018 1138 Last data filed at 10/16/2018 0610 Gross per 24 hour  Intake 1268.84 ml  Output 850 ml  Net 418.84 ml   Filed Weights   10/15/18 1300  Weight: 56.2 kg    Examination:  General exam: Unresponsive to verbal or painful stimuli Respiratory system: Clear to auscultation. Respiratory effort normal.  On room air Cardiovascular system: S1 & S2 heard, irregularly irregular rhythm, normal rate. No JVD, murmurs, rubs, gallops  or clicks. No pedal edema. Gastrointestinal system: Abdomen is nondistended, soft and nontender. No organomegaly or masses felt. Normal bowel sounds heard. Central nervous system: Nonresponsive to verbal command or painful stimulus, pupils equal round reactive to light. Extremities: No peripheral edema Skin: No rashes, lesions or ulcers Psychiatry: Nonresponsive    Data Reviewed: I have personally reviewed following labs and imaging studies  CBC: Recent Labs  Lab 10/15/18 0903 10/15/18 0913 10/15/18 1029  WBC 14.8*  --   --   NEUTROABS 13.4*  --   --   HGB 14.8 15.3* 15.6*  HCT 44.4 45.0 46.0  MCV 87.2  --   --   PLT 332  --   --    Basic Metabolic Panel: Recent Labs  Lab 10/15/18 0903 10/15/18 0913 10/15/18 1029 10/15/18 1437  NA 138 137 138  --   K 3.3* 3.2* 3.1*  --   CL 98 100  --   --   CO2 26  --   --   --   GLUCOSE 150* 152*  --   --   BUN 12 14  --   --   CREATININE 1.12* 1.00  --   --  CALCIUM 9.9  --   --   --   MG  --   --   --  1.9   GFR: Estimated Creatinine Clearance: 30.2 mL/min (by C-G formula based on SCr of 1 mg/dL). Liver Function Tests: Recent Labs  Lab 10/15/18 0903  AST 36  ALT 20  ALKPHOS 94  BILITOT 1.2  PROT 7.5  ALBUMIN 4.3   No results for input(s): LIPASE, AMYLASE in the last 168 hours. No results for input(s): AMMONIA in the last 168 hours. Coagulation Profile: Recent Labs  Lab 10/15/18 0903  INR 0.9   Cardiac Enzymes: No results for input(s): CKTOTAL, CKMB, CKMBINDEX, TROPONINI in the last 168 hours. BNP (last 3 results) No results for input(s): PROBNP in the last 8760 hours. HbA1C: Recent Labs    10/16/18 0625  HGBA1C 6.0*   CBG: Recent Labs  Lab 10/15/18 0905 10/15/18 1703  GLUCAP 143* 143*   Lipid Profile: Recent Labs    10/16/18 0625  CHOL 146  HDL 61  LDLCALC 66  TRIG 97  CHOLHDL 2.4   Thyroid Function Tests: Recent Labs    10/15/18 1437  TSH 0.600   Anemia Panel: No results for  input(s): VITAMINB12, FOLATE, FERRITIN, TIBC, IRON, RETICCTPCT in the last 72 hours. Sepsis Labs: Recent Labs  Lab 10/15/18 0911 10/15/18 1156  LATICACIDVEN 1.0 <0.3*    Recent Results (from the past 240 hour(s))  SARS Coronavirus 2 (CEPHEID- Performed in [facility] hospital lab), Hosp Order     Status: None   Collection Time: 10/15/18  9:42 AM  Result Value Ref Range Status   SARS Coronavirus 2 NEGATIVE NEGATIVE Final    Comment: (NOTE) If result is NEGATIVE SARS-CoV-2 target nucleic acids are NOT DETECTED. The SARS-CoV-2 RNA is generally detectable in upper and lower  respiratory specimens during the acute phase of infection. The lowest  concentration of SARS-CoV-2 viral copies this assay can detect is 250  copies / mL. A negative result does not preclude SARS-CoV-2 infection  and should not be used as the sole basis for treatment or other  patient management decisions.  A negative result may occur with  improper specimen collection / handling, submission of specimen other  than nasopharyngeal swab, presence of viral mutation(s) within the  areas targeted by this assay, and inadequate number of viral copies  (<250 copies / mL). A negative result must be combined with clinical  observations, patient history, and epidemiological information. If result is POSITIVE SARS-CoV-2 target nucleic acids are DETECTED. The SARS-CoV-2 RNA is generally detectable in upper and lower  respiratory specimens dur ing the acute phase of infection.  Positive  results are indicative of active infection with SARS-CoV-2.  Clinical  correlation with patient history and other diagnostic information is  necessary to determine patient infection status.  Positive results do  not rule out bacterial infection or co-infection with other viruses. If result is PRESUMPTIVE POSTIVE SARS-CoV-2 nucleic acids MAY BE PRESENT.   A presumptive positive result was obtained on the submitted specimen  and confirmed on  repeat testing.  While 2019 novel coronavirus  (SARS-CoV-2) nucleic acids may be present in the submitted sample  additional confirmatory testing may be necessary for epidemiological  and / or clinical management purposes  to differentiate between  SARS-CoV-2 and other Sarbecovirus currently known to infect humans.  If clinically indicated additional testing with an alternate test  methodology [phone]) is advised. The SARS-CoV-2 RNA is generally  detectable in upper and lower respiratory  sp ecimens during the acute  phase of infection. The expected result is Negative. Fact Sheet for Patients:  StrictlyIdeas.no Fact Sheet for Healthcare Providers: BankingDealers.co.za This test is not yet approved or cleared by the [place] FDA and has been authorized for detection and/or diagnosis of SARS-CoV-2 by FDA under an Emergency Use Authorization (EUA).  This EUA will remain in effect (meaning this test can be used) for the duration of the COVID-19 declaration under Section 564(b)(1) of the Act, 21 U.S.C. section 360bbb-3(b)(1), unless the authorization is terminated or revoked sooner. Performed at [facility], [place] [address] [place], [place] [address]   Blood culture (routine x 2)     Status: None (Preliminary result)   Collection Time: 10/15/18 11:58 AM  Result Value Ref Range Status   Specimen Description BLOOD LEFT ANTECUBITAL  Final   Special Requests   Final    BOTTLES DRAWN AEROBIC AND ANAEROBIC Blood Culture adequate volume   Culture   Final    NO GROWTH < 24 HOURS Performed at [facility], [place] [address], [place], [place] [address]    Report Status PENDING  Incomplete  Blood culture (routine x 2)     Status: None (Preliminary result)   Collection Time: 10/15/18  2:37 PM  Result Value Ref Range Status   Specimen Description BLOOD RIGHT ANTECUBITAL  Final   Special Requests   Final    BOTTLES DRAWN AEROBIC ONLY  Blood Culture adequate volume   Culture   Final    NO GROWTH < 24 HOURS Performed at [facility], [place] [address] [place],  [address]    Report Status PENDING  Incomplete         Radiology Studies: Ct Code Stroke Cta Head W/wo Contrast  Result Date: 10/15/2018 CLINICAL DATA:  Code stroke. Last seen normal at 6 o'clock p.m. last night. Aphasia. Left-sided weakness. EXAM: CT ANGIOGRAPHY HEAD AND NECK CT PERFUSION BRAIN TECHNIQUE: Multidetector CT imaging of the head and neck was performed using the standard protocol during bolus administration of intravenous contrast. Multiplanar CT image reconstructions and MIPs were obtained to evaluate the vascular anatomy. Carotid stenosis measurements (when applicable) are obtained utilizing NASCET criteria, using the distal internal carotid diameter as the denominator. Multiphase CT imaging of the brain was performed following IV bolus contrast injection. Subsequent parametric perfusion maps were calculated using RAPID software. CONTRAST:  129mL OMNIPAQUE IOHEXOL 350 MG/ML SOLN COMPARISON:  CT head without contrast 10/15/2018. FINDINGS: CTA NECK FINDINGS Aortic arch: Extensive vascular calcifications are present at the aortic arch and origins of the great vessels. A 3 vessel arch configuration is present. There dense calcifications at the origin of the left common carotid artery with greater than 60% stenosis proximally. There is no significant stenosis at the origin of the innominate or of the left subclavian artery. There is no aneurysm. Right carotid system: Right common carotid artery demonstrates some proximal calcifications from the innominate. There distal mural calcifications proximal bifurcation. Extensive calcified plaque is present at the right carotid bifurcation without a significant stenosis of greater than 50% relative to the more distal vessel. The more distal right ICA is normal. Left carotid system: The left common carotid artery  demonstrates the proximal stenosis. Additional mural calcifications are present without other tandem stenoses. Extensive vascular calcifications are present at the left carotid bifurcation without a significant stenosis of greater than 50% relative to the more distal vessel. The more distal left ICA is normal. Vertebral arteries:  The vertebral arteries are codominant. Calcifications are present in the subclavian arteries bilaterally without significant vertebral artery stenoses. There is no significant vertebral artery stenosis or vascular injury in the neck. Skeleton: Endplate degenerative changes and uncovertebral spurring is worse right than left at C5-6 and C6-7. Extensive facet changes are also worse on the right, more in the upper cervical spine. No focal lytic or blastic lesions are present. The patient is status post median sternotomy. Vertebral body heights are maintained. Calvarium is intact. Other neck: No focal mucosal or submucosal lesions are present. Thyroid and salivary glands are normal. There is no significant adenopathy. Upper chest: Centrilobular emphysematous changes are present. There is no focal nodule, mass, or airspace disease. Review of the MIP images confirms the above findings CTA HEAD FINDINGS Anterior circulation: Posterior circulation: Atherosclerotic calcifications are present within the cavernous internal carotid arteries bilaterally. There is no significant stenosis of either vessel relative to the ICA terminus. The A1 and M1 segments are normal. The anterior communicating artery is patent. MCA bifurcations are intact bilaterally. There is asymmetric attenuation of distal left MCA branch vessels compared to the right. No significant proximal stenosis or occlusion is present. Mild distal irregularity is present in the ACA branches bilaterally. Venous sinuses: Atherosclerotic calcifications are present at the dural margin of both vertebral arteries. There is a moderate left V3 segment  stenosis. Vertebrobasilar junction is normal. PICA origins are visualized and normal. Basilar artery is normal. Both posterior cerebral arteries originate from the basilar tip. There is some attenuation of distal PCA branch vessels without significant proximal stenosis or occlusion. Anatomic variants: None Delayed phase: Review of the MIP images confirms the above findings CT Brain Perfusion Findings: ASPECTS: 10/10 CBF (<30%) Volume: 53mL Perfusion (Tmax>6.0s) volume: 72mL Mismatch Volume: 76mL Infarction Location:There is asymmetric ischemia and watershed distribution over the left convexity. No infarct is present. IMPRESSION: 1. Asymmetric decreased perfusion to the left hemisphere evident by decreased cerebral blood flow in a watershed distribution and decreased collateral vessels visible on the CTA. 2. The most significant proximal stenosis on the left is in the proximal left common carotid artery near the origin. There is a stenosis of at least 60%. 3. Extensive atherosclerotic changes are present at the left carotid bifurcation and left cavernous internal carotid artery without a significant stenosis of greater than 50%. 4. No large vessel occlusion. 5. Moderate left vertebral artery stenosis at the V3 segment. 6. Other significant atherosclerotic calcifications as described without other significant stenoses. 7. Moderate degenerative changes in the cervical spine. These results were called by telephone at the time of interpretation on 10/15/2018 at 9:54 am to Dr. Kerney Elbe , who verbally acknowledged these results. Electronically Signed   By: San Morelle M.D.   On: 10/15/2018 10:03   Ct Code Stroke Cta Neck W/wo Contrast  Result Date: 10/15/2018 CLINICAL DATA:  Code stroke. Last seen normal at 6 o'clock p.m. last night. Aphasia. Left-sided weakness. EXAM: CT ANGIOGRAPHY HEAD AND NECK CT PERFUSION BRAIN TECHNIQUE: Multidetector CT imaging of the head and neck was performed using the standard  protocol during bolus administration of intravenous contrast. Multiplanar CT image reconstructions and MIPs were obtained to evaluate the vascular anatomy. Carotid stenosis measurements (when applicable) are obtained utilizing NASCET criteria, using the distal internal carotid diameter as the denominator. Multiphase CT imaging of the brain was performed following IV bolus contrast injection. Subsequent parametric perfusion maps were calculated using RAPID software. CONTRAST:  160mL OMNIPAQUE IOHEXOL 350 MG/ML SOLN COMPARISON:  CT  head without contrast 10/15/2018. FINDINGS: CTA NECK FINDINGS Aortic arch: Extensive vascular calcifications are present at the aortic arch and origins of the great vessels. A 3 vessel arch configuration is present. There dense calcifications at the origin of the left common carotid artery with greater than 60% stenosis proximally. There is no significant stenosis at the origin of the innominate or of the left subclavian artery. There is no aneurysm. Right carotid system: Right common carotid artery demonstrates some proximal calcifications from the innominate. There distal mural calcifications proximal bifurcation. Extensive calcified plaque is present at the right carotid bifurcation without a significant stenosis of greater than 50% relative to the more distal vessel. The more distal right ICA is normal. Left carotid system: The left common carotid artery demonstrates the proximal stenosis. Additional mural calcifications are present without other tandem stenoses. Extensive vascular calcifications are present at the left carotid bifurcation without a significant stenosis of greater than 50% relative to the more distal vessel. The more distal left ICA is normal. Vertebral arteries: The vertebral arteries are codominant. Calcifications are present in the subclavian arteries bilaterally without significant vertebral artery stenoses. There is no significant vertebral artery stenosis or  vascular injury in the neck. Skeleton: Endplate degenerative changes and uncovertebral spurring is worse right than left at C5-6 and C6-7. Extensive facet changes are also worse on the right, more in the upper cervical spine. No focal lytic or blastic lesions are present. The patient is status post median sternotomy. Vertebral body heights are maintained. Calvarium is intact. Other neck: No focal mucosal or submucosal lesions are present. Thyroid and salivary glands are normal. There is no significant adenopathy. Upper chest: Centrilobular emphysematous changes are present. There is no focal nodule, mass, or airspace disease. Review of the MIP images confirms the above findings CTA HEAD FINDINGS Anterior circulation: Posterior circulation: Atherosclerotic calcifications are present within the cavernous internal carotid arteries bilaterally. There is no significant stenosis of either vessel relative to the ICA terminus. The A1 and M1 segments are normal. The anterior communicating artery is patent. MCA bifurcations are intact bilaterally. There is asymmetric attenuation of distal left MCA branch vessels compared to the right. No significant proximal stenosis or occlusion is present. Mild distal irregularity is present in the ACA branches bilaterally. Venous sinuses: Atherosclerotic calcifications are present at the dural margin of both vertebral arteries. There is a moderate left V3 segment stenosis. Vertebrobasilar junction is normal. PICA origins are visualized and normal. Basilar artery is normal. Both posterior cerebral arteries originate from the basilar tip. There is some attenuation of distal PCA branch vessels without significant proximal stenosis or occlusion. Anatomic variants: None Delayed phase: Review of the MIP images confirms the above findings CT Brain Perfusion Findings: ASPECTS: 10/10 CBF (<30%) Volume: 43mL Perfusion (Tmax>6.0s) volume: 38mL Mismatch Volume: 33mL Infarction Location:There is  asymmetric ischemia and watershed distribution over the left convexity. No infarct is present. IMPRESSION: 1. Asymmetric decreased perfusion to the left hemisphere evident by decreased cerebral blood flow in a watershed distribution and decreased collateral vessels visible on the CTA. 2. The most significant proximal stenosis on the left is in the proximal left common carotid artery near the origin. There is a stenosis of at least 60%. 3. Extensive atherosclerotic changes are present at the left carotid bifurcation and left cavernous internal carotid artery without a significant stenosis of greater than 50%. 4. No large vessel occlusion. 5. Moderate left vertebral artery stenosis at the V3 segment. 6. Other significant atherosclerotic calcifications as described without  other significant stenoses. 7. Moderate degenerative changes in the cervical spine. These results were called by telephone at the time of interpretation on 10/15/2018 at 9:54 am to Dr. Kerney Elbe , who verbally acknowledged these results. Electronically Signed   By: San Morelle M.D.   On: 10/15/2018 10:03   Ct Angio Chest Pe W And/or Wo Contrast  Result Date: 10/15/2018 CLINICAL DATA:  Respiratory distress. EXAM: CT ANGIOGRAPHY CHEST WITH CONTRAST TECHNIQUE: Multidetector CT imaging of the chest was performed using the standard protocol during bolus administration of intravenous contrast. Multiplanar CT image reconstructions and MIPs were obtained to evaluate the vascular anatomy. CONTRAST:  30mL OMNIPAQUE IOHEXOL 350 MG/ML SOLN COMPARISON:  Radiograph of same day.  CT scan of January 06, 2009. FINDINGS: Cardiovascular: Satisfactory opacification of the pulmonary arteries to the segmental level. No evidence of pulmonary embolism. Normal heart size. No pericardial effusion. Status post coronary bypass graft. Atherosclerosis of thoracic aorta is noted. Mediastinum/Nodes: Moderate size sliding-type hiatal hernia is noted. No adenopathy is  noted. Thyroid gland is unremarkable. Lungs/Pleura: Lungs are clear. No pleural effusion or pneumothorax. Upper Abdomen: No acute abnormality. Musculoskeletal: No chest wall abnormality. No acute or significant osseous findings. Review of the MIP images confirms the above findings. IMPRESSION: No definite evidence of pulmonary embolus. Moderate size sliding-type hiatal hernia. Aortic Atherosclerosis (ICD10-I70.0). Electronically Signed   By: Marijo Conception M.D.   On: 10/15/2018 13:25   Mr Brain Wo Contrast  Result Date: 10/16/2018 CLINICAL DATA:  Left-sided weakness and aphasia. EXAM: MRI HEAD WITHOUT CONTRAST TECHNIQUE: Multiplanar, multiecho pulse sequences of the brain and surrounding structures were obtained without intravenous contrast. COMPARISON:  CT and CTA earlier today. FINDINGS: Brain: DWI hyperintensity with isointensity on ADC map (shine through) at the medial right thalamus. Even more subtle diffusion hyperintensity along the parasagittal right frontal lobe involving a limited area. Given this pattern, seizure phenomenon is favored. CT perfusion findings may be related to increased flow on the right rather than decreased on the left. No history of fever to imply an encephalitis. There is bilateral medial temporal volume loss with no evidence of temporal or insular edema to imply herpes. Small remote bilateral cerebellar infarcts. Chronic microvascular ischemia in the cerebral white matter. There is cerebral volume loss most notable in the medial temporal lobes in this patient with history of dementia. No acute hemorrhage, mass, or collection. Vascular: Major flow voids are preserved. Basilar and right PCA were widely patent on prior CTA Skull and upper cervical spine: Negative for marrow lesion Sinuses/Orbits: Retention cysts in the left maxillary sinus. Right cataract resection. Other: Significantly and progressively motion degraded study which could easily obscure findings. IMPRESSION: 1.  Edematous signal in the medial right thalamus and possibly in the parasagittal right frontal lobe, favor seizure phenomenon over ischemia. Please ensure no clinical signs of CNS infection. 2. Atrophy in keeping with history of dementia. There is mild chronic small vessel ischemia in the cerebral white matter. Small remote cerebellar infarcts. 3. Significantly motion degraded study. Electronically Signed   By: Monte Fantasia M.D.   On: 10/16/2018 04:21   Ct Code Stroke Cerebral Perfusion With Contrast  Result Date: 10/15/2018 CLINICAL DATA:  Code stroke. Last seen normal at 6 o'clock p.m. last night. Aphasia. Left-sided weakness. EXAM: CT ANGIOGRAPHY HEAD AND NECK CT PERFUSION BRAIN TECHNIQUE: Multidetector CT imaging of the head and neck was performed using the standard protocol during bolus administration of intravenous contrast. Multiplanar CT image reconstructions and MIPs were obtained to  evaluate the vascular anatomy. Carotid stenosis measurements (when applicable) are obtained utilizing NASCET criteria, using the distal internal carotid diameter as the denominator. Multiphase CT imaging of the brain was performed following IV bolus contrast injection. Subsequent parametric perfusion maps were calculated using RAPID software. CONTRAST:  124mL OMNIPAQUE IOHEXOL 350 MG/ML SOLN COMPARISON:  CT head without contrast 10/15/2018. FINDINGS: CTA NECK FINDINGS Aortic arch: Extensive vascular calcifications are present at the aortic arch and origins of the great vessels. A 3 vessel arch configuration is present. There dense calcifications at the origin of the left common carotid artery with greater than 60% stenosis proximally. There is no significant stenosis at the origin of the innominate or of the left subclavian artery. There is no aneurysm. Right carotid system: Right common carotid artery demonstrates some proximal calcifications from the innominate. There distal mural calcifications proximal bifurcation.  Extensive calcified plaque is present at the right carotid bifurcation without a significant stenosis of greater than 50% relative to the more distal vessel. The more distal right ICA is normal. Left carotid system: The left common carotid artery demonstrates the proximal stenosis. Additional mural calcifications are present without other tandem stenoses. Extensive vascular calcifications are present at the left carotid bifurcation without a significant stenosis of greater than 50% relative to the more distal vessel. The more distal left ICA is normal. Vertebral arteries: The vertebral arteries are codominant. Calcifications are present in the subclavian arteries bilaterally without significant vertebral artery stenoses. There is no significant vertebral artery stenosis or vascular injury in the neck. Skeleton: Endplate degenerative changes and uncovertebral spurring is worse right than left at C5-6 and C6-7. Extensive facet changes are also worse on the right, more in the upper cervical spine. No focal lytic or blastic lesions are present. The patient is status post median sternotomy. Vertebral body heights are maintained. Calvarium is intact. Other neck: No focal mucosal or submucosal lesions are present. Thyroid and salivary glands are normal. There is no significant adenopathy. Upper chest: Centrilobular emphysematous changes are present. There is no focal nodule, mass, or airspace disease. Review of the MIP images confirms the above findings CTA HEAD FINDINGS Anterior circulation: Posterior circulation: Atherosclerotic calcifications are present within the cavernous internal carotid arteries bilaterally. There is no significant stenosis of either vessel relative to the ICA terminus. The A1 and M1 segments are normal. The anterior communicating artery is patent. MCA bifurcations are intact bilaterally. There is asymmetric attenuation of distal left MCA branch vessels compared to the right. No significant  proximal stenosis or occlusion is present. Mild distal irregularity is present in the ACA branches bilaterally. Venous sinuses: Atherosclerotic calcifications are present at the dural margin of both vertebral arteries. There is a moderate left V3 segment stenosis. Vertebrobasilar junction is normal. PICA origins are visualized and normal. Basilar artery is normal. Both posterior cerebral arteries originate from the basilar tip. There is some attenuation of distal PCA branch vessels without significant proximal stenosis or occlusion. Anatomic variants: None Delayed phase: Review of the MIP images confirms the above findings CT Brain Perfusion Findings: ASPECTS: 10/10 CBF (<30%) Volume: 43mL Perfusion (Tmax>6.0s) volume: 72mL Mismatch Volume: 13mL Infarction Location:There is asymmetric ischemia and watershed distribution over the left convexity. No infarct is present. IMPRESSION: 1. Asymmetric decreased perfusion to the left hemisphere evident by decreased cerebral blood flow in a watershed distribution and decreased collateral vessels visible on the CTA. 2. The most significant proximal stenosis on the left is in the proximal left common carotid artery near the origin.  There is a stenosis of at least 60%. 3. Extensive atherosclerotic changes are present at the left carotid bifurcation and left cavernous internal carotid artery without a significant stenosis of greater than 50%. 4. No large vessel occlusion. 5. Moderate left vertebral artery stenosis at the V3 segment. 6. Other significant atherosclerotic calcifications as described without other significant stenoses. 7. Moderate degenerative changes in the cervical spine. These results were called by telephone at the time of interpretation on 10/15/2018 at 9:54 am to Dr. Kerney Elbe , who verbally acknowledged these results. Electronically Signed   By: San Morelle M.D.   On: 10/15/2018 10:03   Dg Chest Portable 1 View  Result Date: 10/15/2018 CLINICAL  DATA:  Left-sided weakness vomiting.  Possible aspiration. EXAM: PORTABLE CHEST 1 VIEW COMPARISON:  Radiographs 05/05/2016 and 04/24/2016 FINDINGS: 1032 hours. The heart size and mediastinal contours are stable status post median sternotomy and CABG. There is aortic atherosclerosis. The lungs are clear. There is no pleural effusion or pneumothorax. No acute osseous findings are evident. Prominent bilateral breast capsular calcifications are noted. Telemetry leads overlie the chest. IMPRESSION: Stable chest.  No active cardiopulmonary process. Electronically Signed   By: Richardean Sale M.D.   On: 10/15/2018 10:45   Ct Head Code Stroke Wo Contrast  Result Date: 10/15/2018 CLINICAL DATA:  Code stroke.  Left-sided paralysis and aphasia. EXAM: CT HEAD WITHOUT CONTRAST TECHNIQUE: Contiguous axial images were obtained from the base of the skull through the vertex without intravenous contrast. 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 Chronic small-vessel ischemic changes of the pons. Generalized age related atrophy. Chronic small-vessel ischemic changes of the hemispheric white matter. No sign of acute cortical infarction. No mass lesion, hemorrhage, hydrocephalus or extra-axial collection. Vascular: There is atherosclerotic calcification of the major vessels at the base of the brain. Skull: Negative Sinuses/Orbits: Clear/normal Other: None ASPECTS ([place] Stroke Program Early CT Score) - Ganglionic level infarction (caudate, lentiform nuclei, internal capsule, insula, M1-M3 cortex): 7 - Supraganglionic infarction (M4-M6 cortex): 3 Total score (0-10 with 10 being normal): 10 IMPRESSION: 1. No acute finding by CT. Atrophy and chronic small-vessel ischemic changes. 2. ASPECTS is 10. 3. These results were communicated to Dr. Cheral Marker at [facility] 5/26/2020by text page via the [facility] messaging system. Electronically Signed   By: Nelson Chimes M.D.   On: 10/15/2018 09:23         Scheduled Meds:  aspirin  300 mg Rectal Daily   enoxaparin (LOVENOX) injection  1 mg/kg Subcutaneous Q24H   pantoprazole (PROTONIX) IV  40 mg Intravenous Q24H   sodium chloride flush  3 mL Intravenous Once   Continuous Infusions:  sodium chloride 50 mL/hr at 10/16/18 0610   levETIRAcetam       LOS: 1 day    Time spent: 29 minutes    [name] J [place] DO [facility] Pager [phone]  If 7PM-7AM, please contact night-coverage [email or web address] Password [facility] 10/16/2018, 11:38 AM

## 2018-10-16 NOTE — Progress Notes (Signed)
  Echocardiogram 2D Echocardiogram has been performed.  [name] 10/16/2018, 1:39 PM

## 2018-10-16 NOTE — Progress Notes (Signed)
EEG complete - results pending 

## 2018-10-16 NOTE — Evaluation (Signed)
Occupational Therapy Evaluation Patient Details Name: [name] MRN: [id number] DOB: 07-Oct-1926 Today's Date: 10/16/2018    History of Present Illness Patient is a 85 y/o female who presents with left sided weakness and aphasia. Concern for possible aspiration PNA. Head CT-Asymmetric decreased perfusion to the left hemisphere evident by decreased cerebral blood flow in a watershed distribution. MRI negative for stroke, suspect seizure activity. PMH includes CAD s/p CABG, HTN, HLD, PVD, dementia, CKD.    Clinical Impression   Pt is non verbal, information regarding PLOF taken from previous chart and RN's report. Pt is from home, her family checked on her frequently, but she functioned independently. Pt presents with lethargy unless stimulated, generalized weakness greater on L vs R with tremors and posturing of L UE. She is dependent in all ADL and requires 2 person assist for mobility. Recommending SNF. Will follow acutely.    Follow Up Recommendations  SNF;Supervision/Assistance - 24 hour    Equipment Recommendations  Other (comment)(defer to next venue)    Recommendations for Other Services       Precautions / Restrictions Precautions Precautions: Fall Precaution Comments: MD in room, pt may be still seizing Restrictions Weight Bearing Restrictions: No      Mobility Bed Mobility Overal bed mobility: Needs Assistance Bed Mobility: Rolling;Supine to Sit;Sit to Supine Rolling: +2 for physical assistance;Total assist   Supine to sit: +2 for physical assistance;Total assist Sit to supine: +2 for physical assistance;Total assist   General bed mobility comments: assist for all aspects, no initiation of movement  Transfers Overall transfer level: Needs assistance Equipment used: 2 person hand held assist Transfers: Sit to/from Stand Sit to Stand: +2 physical assistance;Total assist         General transfer comment: performed x 2, pt locking knees in extension once  in standing, flexed posture    Balance Overall balance assessment: Needs assistance Sitting-balance support: Bilateral upper extremity supported Sitting balance-Leahy Scale: Poor Sitting balance - Comments: progressed to min guard assist   Standing balance support: Bilateral upper extremity supported Standing balance-Leahy Scale: Zero                             ADL either performed or assessed with clinical judgement   ADL                                         General ADL Comments: requires total assist, pt with bowel incontinence and drooling     Vision   Additional Comments: eyes darting L at times, appears to focus on therapist's face momentarily     Perception     Praxis      Pertinent Vitals/Pain Pain Assessment: Faces Faces Pain Scale: No hurt     Hand Dominance Right   Extremity/Trunk Assessment Upper Extremity Assessment Upper Extremity Assessment: LUE deficits/detail;RUE deficits/detail RUE Deficits / Details: generalized weakness, tremulous, can bring hand to face, head RUE Coordination: decreased fine motor;decreased gross motor LUE Deficits / Details: posturing in flexion, supination with relaxed hand, tremulous LUE Coordination: decreased fine motor;decreased gross motor   Lower Extremity Assessment Lower Extremity Assessment: Defer to PT evaluation       Communication Communication Communication: Expressive difficulties(non verbal)   Cognition Arousal/Alertness: Awake/alert;Lethargic(returns to sleep when not stimulated) Behavior During Therapy: Flat affect Overall Cognitive Status: Difficult to assess  General Comments: pt not following commands, sleeps/snores when not stimulated   General Comments       Exercises     Shoulder Instructions      Home Living Family/patient expects to be discharged to:: Skilled nursing facility Living Arrangements:  Alone Available Help at Discharge: Family;Available PRN/intermittently Type of Home: House Home Access: Stairs to enter [employer] of Steps: 2 Entrance Stairs-Rails: None Home Layout: One level               Home Equipment: None          Prior Functioning/Environment Level of Independence: Independent        Comments: Per chart, pt was independent with ambulation PTA. Family checked on her daily and assisted with IADLs- shopping etc. Information about home setup and PLOF from prior admission over 1 year ago and from MD note.         OT Problem List: Decreased strength;Decreased activity tolerance;Impaired balance (sitting and/or standing);Decreased coordination;Decreased cognition;Impaired tone;Impaired UE functional use      OT Treatment/Interventions: Self-care/ADL training;DME and/or AE instruction;Cognitive remediation/compensation;Patient/family education;Balance training;Therapeutic activities    OT Goals(Current goals can be found in the care plan section) Acute Rehab OT Goals Patient Stated Goal: unable to state OT Goal Formulation: With patient Time For Goal Achievement: 10/30/18 Potential to Achieve Goals: Good ADL Goals Pt Will Perform Grooming: with mod assist;sitting Additional ADL Goal #1: Pt will follow one step commands with 25% accuracy. Additional ADL Goal #2: Pt will sit EOB with supervision x 10 minutes in preparation for ADL. Additional ADL Goal #3: Pt will perform bed mobility with moderate assistance and verbal cues.  OT Frequency: Min 2X/week   Barriers to D/C:            Co-evaluation PT/OT/SLP Co-Evaluation/Treatment: Yes Reason for Co-Treatment: Complexity of the patient's impairments (multi-system involvement);For patient/therapist safety   OT goals addressed during session: Strengthening/ROM;ADL's and self-care      AM-PAC OT "6 Clicks" Daily Activity     Outcome Measure Help from another person eating meals?:  Total Help from another person taking care of personal grooming?: Total Help from another person toileting, which includes using toliet, bedpan, or urinal?: Total Help from another person bathing (including washing, rinsing, drying)?: Total Help from another person to put on and taking off regular upper body clothing?: Total Help from another person to put on and taking off regular lower body clothing?: Total 6 Click Score: 6   End of Session Equipment Utilized During Treatment: Gait belt Nurse Communication: Mobility status;Other (comment)(assisted with clean up and rolling for suppository)  Activity Tolerance: Patient tolerated treatment well Patient left: in bed;with call bell/phone within reach  OT Visit Diagnosis: Unsteadiness on feet (R26.81);Cognitive communication deficit (R41.841);Muscle weakness (generalized) (M62.81)                Time: 6767-2094 OT Time Calculation (min): 35 min Charges:  OT General Charges $OT Visit: 1 Visit OT Evaluation $OT Eval Moderate Complexity: 1 Mod  Nestor Lewandowsky, OTR/L Acute Rehabilitation Services Pager: [phone] Office: [phone]  Malka So 10/16/2018, 10:18 AM

## 2018-10-16 NOTE — Progress Notes (Signed)
Physical Therapy Treatment Patient Details Name: [name] MRN: [id number] DOB: [birth year] Today's Date: 10/16/2018    History of Present Illness Patient is a 85 y/o female who presents with left sided weakness and aphasia. Concern for possible aspiration PNA. Head CT-Asymmetric decreased perfusion to the left hemisphere evident by decreased cerebral blood flow in a watershed distribution. MRI negative for stroke, suspect seizure activity. PMH includes CAD s/p CABG, HTN, HLD, PVD, dementia, CKD.     PT Comments    Patient for mobility progression. Pt requires +2 assist for all mobility this session and is lethargic when not stimulated. Pt present with L side weakness and tremors. Pt is non verbal and not following commands throughout session. EEG pending. Current plan remains appropriate.   Follow Up Recommendations  SNF;Supervision/Assistance - 24 hour     Equipment Recommendations  Other (comment)(TBD next venue)    Recommendations for Other Services       Precautions / Restrictions Precautions Precautions: Fall Precaution Comments: MD in room, pt may be still seizing Restrictions Weight Bearing Restrictions: No    Mobility  Bed Mobility Overal bed mobility: Needs Assistance Bed Mobility: Rolling;Supine to Sit;Sit to Supine Rolling: +2 for physical assistance;Total assist   Supine to sit: +2 for physical assistance;Total assist Sit to supine: +2 for physical assistance;Total assist   General bed mobility comments: assist for all aspects, no initiation of movement  Transfers Overall transfer level: Needs assistance Equipment used: 2 person hand held assist Transfers: Sit to/from Stand Sit to Stand: +2 physical assistance;Total assist         General transfer comment: performed x 2, pt locking knees in extension once in standing, flexed posture  Ambulation/Gait                 Stairs             Wheelchair Mobility    Modified Rankin  (Stroke Patients Only)       Balance Overall balance assessment: Needs assistance Sitting-balance support: Bilateral upper extremity supported Sitting balance-Leahy Scale: Poor Sitting balance - Comments: progressed to min guard assist   Standing balance support: Bilateral upper extremity supported Standing balance-Leahy Scale: Zero                              Cognition Arousal/Alertness: Awake/alert;Lethargic(returns to sleep when not stimulated) Behavior During Therapy: Flat affect Overall Cognitive Status: Difficult to assess                                 General Comments: pt not following commands, sleeps/snores when not stimulated      Exercises      General Comments General comments (skin integrity, edema, etc.): pt with tremors/shaking/muscle twitching and L UE weakness > R      Pertinent Vitals/Pain Pain Assessment: Faces Faces Pain Scale: No hurt    Home Living Family/patient expects to be discharged to:: Skilled nursing facility Living Arrangements: Alone Available Help at Discharge: Family;Available PRN/intermittently Type of Home: House Home Access: Stairs to enter Entrance Stairs-Rails: None Home Layout: One level Home Equipment: None      Prior Function Level of Independence: Independent      Comments: Per chart, pt was independent with ambulation PTA. Family checked on her daily and assisted with IADLs- shopping etc. Information about home setup and PLOF from prior admission over 1 year  ago and from MD note.    PT Goals (current goals can now be found in the care plan section) Acute Rehab PT Goals Patient Stated Goal: unable to state Progress towards PT goals: Not progressing toward goals - comment    Frequency    Min 3X/week      PT Plan Current plan remains appropriate    Co-evaluation PT/OT/SLP Co-Evaluation/Treatment: Yes Reason for Co-Treatment: Complexity of the patient's impairments (multi-system  involvement);For patient/therapist safety PT goals addressed during session: Mobility/safety with mobility OT goals addressed during session: Strengthening/ROM;ADL's and self-care      AM-PAC PT "6 Clicks" Mobility   Outcome Measure  Help needed turning from your back to your side while in a flat bed without using bedrails?: Total Help needed moving from lying on your back to sitting on the side of a flat bed without using bedrails?: Total Help needed moving to and from a bed to a chair (including a wheelchair)?: Total Help needed standing up from a chair using your arms (e.g., wheelchair or bedside chair)?: Total Help needed to walk in hospital room?: Total Help needed climbing 3-5 steps with a railing? : Total 6 Click Score: 6    End of Session Equipment Utilized During Treatment: Gait belt Activity Tolerance: Patient tolerated treatment well Patient left: in bed;with call bell/phone within reach;with SCD's reapplied Nurse Communication: Mobility status;Need for lift equipment PT Visit Diagnosis: Pain;Hemiplegia and hemiparesis;Muscle weakness (generalized) (M62.81) Hemiplegia - Right/Left: Left Hemiplegia - dominant/non-dominant: Non-dominant Hemiplegia - caused by: Cerebral infarction Pain - Right/Left: Left Pain - part of body: Arm     Time: 7829-5621 PT Time Calculation (min) (ACUTE ONLY): 34 min  Charges:  $Therapeutic Activity: 8-22 mins                     Earney Navy, PTA Acute Rehabilitation Services Pager: [phone] Office: [phone]     Darliss Cheney 10/16/2018, 11:15 AM

## 2018-10-17 DIAGNOSIS — R569 Unspecified convulsions: Secondary | ICD-10-CM

## 2018-10-17 DIAGNOSIS — Z7189 Other specified counseling: Secondary | ICD-10-CM

## 2018-10-17 DIAGNOSIS — Z515 Encounter for palliative care: Secondary | ICD-10-CM

## 2018-10-17 LAB — BASIC METABOLIC PANEL
Anion gap: 9 (ref 5–15)
BUN: 22 mg/dL (ref 8–23)
CO2: 31 mmol/L (ref 22–32)
Calcium: 8.9 mg/dL (ref 8.9–10.3)
Chloride: 101 mmol/L (ref 98–111)
Creatinine, Ser: 0.92 mg/dL (ref 0.44–1.00)
GFR calc Af Amer: >60 mL/min (ref >60)
GFR calc non Af Amer: 55 mL/min — ABNORMAL LOW (ref >60)
Glucose, Bld: 81 mg/dL (ref 70–99)
Potassium: 3.2 mmol/L — ABNORMAL LOW (ref 3.5–5.1)
Sodium: 141 mmol/L (ref 135–145)

## 2018-10-17 LAB — CBC
HCT: 50.1 % — ABNORMAL HIGH (ref 36.0–46.0)
Hemoglobin: 17.2 g/dL — ABNORMAL HIGH (ref 12.0–15.0)
MCH: 29.1 pg (ref 26.0–34.0)
MCHC: 34.3 g/dL (ref 30.0–36.0)
MCV: 84.6 fL (ref 80.0–100.0)
Platelets: 192 10*3/uL (ref 150–400)
RBC: 5.92 MIL/uL — ABNORMAL HIGH (ref 3.87–5.11)
RDW: 12.6 % (ref 11.5–15.5)
WBC: 12 10*3/uL — ABNORMAL HIGH (ref 4.0–10.5)
nRBC: 0 % (ref 0.0–0.2)

## 2018-10-17 LAB — MAGNESIUM: Magnesium: 2 mg/dL (ref 1.7–2.4)

## 2018-10-17 LAB — CK: Total CK: 241 U/L — ABNORMAL HIGH (ref 38–234)

## 2018-10-17 MED ORDER — SODIUM CHLORIDE 0.9 % IV SOLN
200.0000 mg | Freq: Once | INTRAVENOUS | Status: AC
  Administered 2018-10-17: 200 mg via INTRAVENOUS
  Filled 2018-10-17: qty 20

## 2018-10-17 MED ORDER — MORPHINE SULFATE (PF) 2 MG/ML IV SOLN
1.0000 mg | INTRAVENOUS | Status: DC | PRN
  Administered 2018-10-17: 21:00:00 1 mg via INTRAVENOUS
  Filled 2018-10-17: qty 1

## 2018-10-17 MED ORDER — POTASSIUM CHLORIDE 10 MEQ/100ML IV SOLN
10.0000 meq | INTRAVENOUS | Status: AC
  Administered 2018-10-17 (×6): 10 meq via INTRAVENOUS
  Filled 2018-10-17: qty 100

## 2018-10-17 MED ORDER — HYDRALAZINE HCL 20 MG/ML IJ SOLN
10.0000 mg | Freq: Four times a day (QID) | INTRAMUSCULAR | Status: DC | PRN
  Filled 2018-10-17: qty 1

## 2018-10-17 MED ORDER — SODIUM CHLORIDE 0.9 % IV SOLN
100.0000 mg | Freq: Two times a day (BID) | INTRAVENOUS | Status: DC
  Administered 2018-10-17 – 2018-10-18 (×2): 100 mg via INTRAVENOUS
  Filled 2018-10-17 (×3): qty 10

## 2018-10-17 NOTE — Progress Notes (Signed)
[employer] [facility]) Hospice  Received referral for residential hospice at [facility].  The patient is eligible and we have a bed to offer.    Spoke with son [name], he confirmed interest.  He also states that he is ready to stop all testing, that his mother would not want this.  He spoke with the pts PCP today who advised him to advocate for moving towards comfort measures.  Updated CM.    Thank you,  Venia Carbon RN, BSN, [facility] Liaison (in [place]) [phone]

## 2018-10-17 NOTE — Procedures (Signed)
  Electroencephalogram report- LTM with VIDEO  Ordering Physician : inpatient neurology   Beginning time: 10/16/18 at 22 20  Ending time:  10/17/18 at 10 45  CPT/type : 95720  Day of study: day 1   Technical Description: The EEG was performed using standard setting per the guidelines of [employer] (ACNS).    A minimum of 21 electrodes were placed on scalp according to the International 10-20 or/and 10-10 Systems. Supplemental electrodes were placed as needed. Single EKG electrode was also used to detect cardiac arrhythmia. Patient's behavior was continuously recorded on video simultaneously with EEG. A minimum of 16 channels were used for data display. Each epoch of study was reviewed manually daily and as needed using standard referential and bipolar montages. Computerized quantitative EEG analysis (such as compressed spectral array analysis, trending, automated spike & seizure detection) were used as indicated.    Spike detection: ON  Seizure detection: ON   This day 1  of continuous  EEG monitoring with simultaneous video monitoring was performed for this patient with convulsions to rule out clinical and subclinical seizures.   Medications: As per EMR   Background activities marked by continuous, reactive background activities with state changes ranging between 3 to 6 cps. More prominent slowing present across right hemisphere particular in anterior right hemispheric cortex.  Superimposed continuous right hemispheric periodic lateralized epileptiform discharges present with frequency 1/s and maximum negativity in the right frontocentral cortex.  Negative field was  broad extending to the left anterior cortex and midline.    At times right periodic lateralized epileptiform discharges marked by initial polyspike and wave discharges complex followed by rhythmic synchronized evolving runs of spike and wave discharges in short trains lasting between 4 to 6  seconds.  With longer runs patient had  left facial dystonia and a rhythmic facial movements particularly lower face possibly suggestive of some clinical motor manifestations of this electrographic seizures.  Although clinical manifestations were somewhat difficult to discern on suboptimal video and were fairly subtle .   Clinical interpretation: This day 1 of continuous EEG monitoring with simultaneous video monitoring was abnormal for several reasons.  #1 background activity slowing suggestive of mild to moderate encephalopathy of nonspecific etiologies.  #2 right hemispheric slowing with superimposed right hemispheric periodic lateralized epileptiform discharges maximum negativity right anterior frontal cortex suggestive of significant cortical irritability and neuronal dysfunction in that region.  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  This finding suggestive of significant neuronal dysfunction and cortical irritability in the right hemisphere particular right frontal cortex.  Continuous monitoring is recommended to ensure improvement.  Clinical correlation is advised. Continuous monitoring is recommended to capture events of interest and determine if these are seizures

## 2018-10-17 NOTE — Progress Notes (Addendum)
Subjective: Continued PLEDs on EEG overnight per Dr. Leonel Ramsay, despite addition of VPA to her Keppra yesterday.   Objective: Current vital signs: BP (!) 157/71   Pulse 81   Temp 97.8 F (36.6 C) (Oral)   Resp 15   Ht 5\' 2"  (1.575 m)   Wt 56.2 kg   SpO2 93%   BMI 22.68 kg/m  Vital signs in last 24 hours: Temp:  [97.6 F (36.4 C)-98 F (36.7 C)] 97.8 F (36.6 C) (05/28 0357) Pulse Rate:  [51-90] 81 (05/28 0357) Resp:  [14-20] 15 (05/28 0357) BP: (127-157)/(59-92) 157/71 (05/28 0357) SpO2:  [93 %-97 %] 93 % (05/28 0357)  Intake/Output from previous day: 05/27 0701 - 05/28 0700 In: 1208.9 [I.V.:1062.7; IV Piggyback:146.2] Out: 700 [Urine:700] Intake/Output this shift: No intake/output data recorded. Nutritional status:  Diet Order            Diet NPO time specified  Diet effective now             HEENT: [place]/AT Lungs: Respirations unlabored Ext: Warm and well perfused  Neurologic Exam: Ment: Obtunded. Eyes closed and do not open to verbal or light noxious initially. With repeated light sternal rub and verbal stimulation, she opens her eyes transiently but does not fixate. Patient is drooling from left side of mouth. Nonverbal. Not following commands.  CN: With eyelids open, eyes are conjugate. Face symmetric. Motor/Sensory: Moves BUE and BLE symmetrically to noxious. Reflexes: Symmetric.   Lab Results: Results for orders placed or performed during the hospital encounter of 10/15/18 (from the past 48 hour(s))  Protime-INR     Status: None   Collection Time: 10/15/18  9:03 AM  Result Value Ref Range   Prothrombin Time 12.5 11.4 - 15.2 seconds   INR 0.9 0.8 - 1.2    Comment: (NOTE) INR goal varies based on device and disease states. Performed at [facility], [place] [address] [place], [place] [address]   APTT     Status: None   Collection Time: 10/15/18  9:03 AM  Result Value Ref Range   aPTT 29 24 - 36 seconds    Comment: Performed at [facility] [address], [place], [place] [address]  CBC     Status: Abnormal   Collection Time: 10/15/18  9:03 AM  Result Value Ref Range   WBC 14.8 (H) 4.0 - 10.5 K/uL   RBC 5.09 3.87 - 5.11 MIL/uL   Hemoglobin 14.8 12.0 - 15.0 g/dL   HCT 44.4 36.0 - 46.0 %   MCV 87.2 80.0 - 100.0 fL   MCH 29.1 26.0 - 34.0 pg   MCHC 33.3 30.0 - 36.0 g/dL   RDW 12.6 11.5 - 15.5 %   Platelets 332 150 - 400 K/uL   nRBC 0.0 0.0 - 0.2 %    Comment: Performed at [facility], [place] [address] [place], [place] [address]  Differential     Status: Abnormal   Collection Time: 10/15/18  9:03 AM  Result Value Ref Range   Neutrophils Relative % 90 %   Neutro Abs 13.4 (H) 1.7 - 7.7 K/uL   Lymphocytes Relative 5 %   Lymphs Abs 0.7 0.7 - 4.0 K/uL   Monocytes Relative 4 %   Monocytes Absolute 0.5 0.1 - 1.0 K/uL   Eosinophils Relative 0 %   Eosinophils Absolute 0.0 0.0 - 0.5 K/uL   Basophils Relative 0 %   Basophils Absolute 0.0 0.0 - 0.1 K/uL  Immature Granulocytes 1 %   Abs Immature Granulocytes 0.08 (H) 0.00 - 0.07 K/uL    Comment: Performed at [facility], [place] [address] [place], [place] [address]  Comprehensive metabolic panel     Status: Abnormal   Collection Time: 10/15/18  9:03 AM  Result Value Ref Range   Sodium 138 135 - 145 mmol/L   Potassium 3.3 (L) 3.5 - 5.1 mmol/L   Chloride 98 98 - 111 mmol/L   CO2 26 22 - 32 mmol/L   Glucose, Bld 150 (H) 70 - 99 mg/dL   BUN 12 8 - 23 mg/dL   Creatinine, Ser 1.12 (H) 0.44 - 1.00 mg/dL   Calcium 9.9 8.9 - 10.3 mg/dL   Total Protein 7.5 6.5 - 8.1 g/dL   Albumin 4.3 3.5 - 5.0 g/dL   AST 36 15 - 41 U/L   ALT 20 0 - 44 U/L   Alkaline Phosphatase 94 38 - 126 U/L   Total Bilirubin 1.2 0.3 - 1.2 mg/dL   GFR calc non Af Amer 44 (L) >60 mL/min   GFR calc Af Amer 50 (L) >60 mL/min   Anion gap 14 5 - 15    Comment: Performed at [facility] [address], [place], [place] [address]  CBG monitoring, ED     Status: Abnormal   Collection  Time: 10/15/18  9:05 AM  Result Value Ref Range   Glucose-Capillary 143 (H) 70 - 99 mg/dL  Lactic acid, plasma     Status: None   Collection Time: 10/15/18  9:11 AM  Result Value Ref Range   Lactic Acid, Venous 1.0 0.5 - 1.9 mmol/L    Comment: Performed at [facility] [address], [place], [place] [address]  I-stat chem 8, ED [facility] and [facility] only)     Status: Abnormal   Collection Time: 10/15/18  9:13 AM  Result Value Ref Range   Sodium 137 135 - 145 mmol/L   Potassium 3.2 (L) 3.5 - 5.1 mmol/L   Chloride 100 98 - 111 mmol/L   BUN 14 8 - 23 mg/dL   Creatinine, Ser 1.00 0.44 - 1.00 mg/dL   Glucose, Bld 152 (H) 70 - 99 mg/dL   Calcium, Ion 1.12 (L) 1.15 - 1.40 mmol/L   TCO2 25 22 - 32 mmol/L   Hemoglobin 15.3 (H) 12.0 - 15.0 g/dL   HCT 45.0 36.0 - 46.0 %  SARS Coronavirus 2 (CEPHEID- Performed in [facility] lab), Hosp Order     Status: None   Collection Time: 10/15/18  9:42 AM  Result Value Ref Range   SARS Coronavirus 2 NEGATIVE NEGATIVE    Comment: (NOTE) If result is NEGATIVE SARS-CoV-2 target nucleic acids are NOT DETECTED. The SARS-CoV-2 RNA is generally detectable in upper and lower  respiratory specimens during the acute phase of infection. The lowest  concentration of SARS-CoV-2 viral copies this assay can detect is 250  copies / mL. A negative result does not preclude SARS-CoV-2 infection  and should not be used as the sole basis for treatment or other  patient management decisions.  A negative result may occur with  improper specimen collection / handling, submission of specimen other  than nasopharyngeal swab, presence of viral mutation(s) within the  areas targeted by this assay, and inadequate number of viral copies  (<250 copies / mL). A negative result must be combined with clinical  observations, patient history, and epidemiological information. If result is POSITIVE  SARS-CoV-2 target nucleic acids are DETECTED. The SARS-CoV-2 RNA is generally  detectable in upper and lower  respiratory specimens dur ing the acute phase of infection.  Positive  results are indicative of active infection with SARS-CoV-2.  Clinical  correlation with patient history and other diagnostic information is  necessary to determine patient infection status.  Positive results do  not rule out bacterial infection or co-infection with other viruses. If result is PRESUMPTIVE POSTIVE SARS-CoV-2 nucleic acids MAY BE PRESENT.   A presumptive positive result was obtained on the submitted specimen  and confirmed on repeat testing.  While 2019 novel coronavirus  (SARS-CoV-2) nucleic acids may be present in the submitted sample  additional confirmatory testing may be necessary for epidemiological  and / or clinical management purposes  to differentiate between  SARS-CoV-2 and other Sarbecovirus currently known to infect humans.  If clinically indicated additional testing with an alternate test  methodology [phone]) is advised. The SARS-CoV-2 RNA is generally  detectable in upper and lower respiratory sp ecimens during the acute  phase of infection. The expected result is Negative. Fact Sheet for Patients:  StrictlyIdeas.no Fact Sheet for Healthcare Providers: BankingDealers.co.za This test is not yet approved or cleared by the [place] FDA and has been authorized for detection and/or diagnosis of SARS-CoV-2 by FDA under an Emergency Use Authorization (EUA).  This EUA will remain in effect (meaning this test can be used) for the duration of the COVID-19 declaration under Section 564(b)(1) of the Act, 21 U.S.C. section 360bbb-3(b)(1), unless the authorization is terminated or revoked sooner. Performed at [facility], [place] [address], [place], [place] [address]   POCT I-Stat EG7     Status: Abnormal   Collection Time: 10/15/18 10:29 AM  Result Value Ref Range   pH, Ven 7.392 7.250 - 7.430   pCO2, Ven  43.1 (L) 44.0 - 60.0 mmHg   pO2, Ven 45.0 32.0 - 45.0 mmHg   Bicarbonate 26.2 20.0 - 28.0 mmol/L   TCO2 28 22 - 32 mmol/L   O2 Saturation 80.0 %   Acid-Base Excess 1.0 0.0 - 2.0 mmol/L   Sodium 138 135 - 145 mmol/L   Potassium 3.1 (L) 3.5 - 5.1 mmol/L   Calcium, Ion 1.14 (L) 1.15 - 1.40 mmol/L   HCT 46.0 36.0 - 46.0 %   Hemoglobin 15.6 (H) 12.0 - 15.0 g/dL   Patient temperature HIDE    Sample type VENOUS   Lactic acid, plasma     Status: Abnormal   Collection Time: 10/15/18 11:56 AM  Result Value Ref Range   Lactic Acid, Venous <0.3 (L) 0.5 - 1.9 mmol/L    Comment: RESULT REPEATED AND VERIFIED Performed at [facility], [address] [place], [place] [address]   Blood culture (routine x 2)     Status: None (Preliminary result)   Collection Time: 10/15/18 11:58 AM  Result Value Ref Range   Specimen Description BLOOD LEFT ANTECUBITAL    Special Requests      BOTTLES DRAWN AEROBIC AND ANAEROBIC Blood Culture adequate volume   Culture      NO GROWTH < 24 HOURS Performed at [facility], [place] [address], [place], [place] [address]    Report Status PENDING   Blood culture (routine x 2)     Status: None (Preliminary result)   Collection Time: 10/15/18  2:37 PM  Result Value Ref Range   Specimen Description BLOOD RIGHT ANTECUBITAL    Special Requests  BOTTLES DRAWN AEROBIC ONLY Blood Culture adequate volume   Culture      NO GROWTH < 24 HOURS Performed at [facility] [address], [place], [place] [address]    Report Status PENDING   Magnesium     Status: None   Collection Time: 10/15/18  2:37 PM  Result Value Ref Range   Magnesium 1.9 1.7 - 2.4 mg/dL    Comment: Performed at [facility], [place] [address], [place], [place] [address]  TSH     Status: None   Collection Time: 10/15/18  2:37 PM  Result Value Ref Range   TSH 0.600 0.350 - 4.500 uIU/mL    Comment: Performed by a 3rd Generation assay with a functional sensitivity of <=0.01  uIU/mL. Performed at [facility], [place] [address] [place], [place] [address]   Glucose, capillary     Status: Abnormal   Collection Time: 10/15/18  5:03 PM  Result Value Ref Range   Glucose-Capillary 143 (H) 70 - 99 mg/dL  Hemoglobin A1c     Status: Abnormal   Collection Time: 10/16/18  6:25 AM  Result Value Ref Range   Hgb A1c MFr Bld 6.0 (H) 4.8 - 5.6 %    Comment: (NOTE) Pre diabetes:          5.7%-6.4% Diabetes:              >6.4% Glycemic control for   <7.0% adults with diabetes    Mean Plasma Glucose 125.5 mg/dL    Comment: Performed at [facility] [address] [place], [place] [address]  Lipid panel     Status: None   Collection Time: 10/16/18  6:25 AM  Result Value Ref Range   Cholesterol 146 0 - 200 mg/dL   Triglycerides 97 <150 mg/dL   HDL 61 >40 mg/dL   Total CHOL/HDL Ratio 2.4 RATIO   VLDL 19 0 - 40 mg/dL   LDL Cholesterol 66 0 - 99 mg/dL    Comment:        Total Cholesterol/HDL:CHD Risk Coronary Heart Disease Risk Table                     Men   Women  1/2 Average Risk   3.4   3.3  Average Risk       5.0   4.4  2 X Average Risk   9.6   7.1  3 X Average Risk  23.4   11.0        Use the calculated Patient Ratio above and the CHD Risk Table to determine the patient's CHD Risk.        ATP III CLASSIFICATION (LDL):  <100     mg/dL   Optimal  100-129  mg/dL   Near or Above                    Optimal  130-159  mg/dL   Borderline  160-189  mg/dL   High  >190     mg/dL   Very High Performed at [facility] [address], [place], [place] [address]   Ammonia     Status: None   Collection Time: 10/16/18  6:57 PM  Result Value Ref Range   Ammonia 29 9 - 35 umol/L    Comment: Performed at [facility], [place] [address] [place], [place] [address]  CBC     Status: Abnormal  Collection Time: 10/17/18  4:40 AM  Result Value Ref Range   WBC 12.0 (H) 4.0 - 10.5 K/uL   RBC 5.92 (H) 3.87 - 5.11 MIL/uL   Hemoglobin 17.2 (H) 12.0 -  15.0 g/dL   HCT 50.1 (H) 36.0 - 46.0 %   MCV 84.6 80.0 - 100.0 fL   MCH 29.1 26.0 - 34.0 pg   MCHC 34.3 30.0 - 36.0 g/dL   RDW 12.6 11.5 - 15.5 %   Platelets 192 150 - 400 K/uL   nRBC 0.0 0.0 - 0.2 %    Comment: Performed at [facility], [place] [address] [place], [place] [address]    Recent Results (from the past 240 hour(s))  SARS Coronavirus 2 (CEPHEID- Performed in [facility] lab), Hosp Order     Status: None   Collection Time: 10/15/18  9:42 AM  Result Value Ref Range Status   SARS Coronavirus 2 NEGATIVE NEGATIVE Final    Comment: (NOTE) If result is NEGATIVE SARS-CoV-2 target nucleic acids are NOT DETECTED. The SARS-CoV-2 RNA is generally detectable in upper and lower  respiratory specimens during the acute phase of infection. The lowest  concentration of SARS-CoV-2 viral copies this assay can detect is 250  copies / mL. A negative result does not preclude SARS-CoV-2 infection  and should not be used as the sole basis for treatment or other  patient management decisions.  A negative result may occur with  improper specimen collection / handling, submission of specimen other  than nasopharyngeal swab, presence of viral mutation(s) within the  areas targeted by this assay, and inadequate number of viral copies  (<250 copies / mL). A negative result must be combined with clinical  observations, patient history, and epidemiological information. If result is POSITIVE SARS-CoV-2 target nucleic acids are DETECTED. The SARS-CoV-2 RNA is generally detectable in upper and lower  respiratory specimens dur ing the acute phase of infection.  Positive  results are indicative of active infection with SARS-CoV-2.  Clinical  correlation with patient history and other diagnostic information is  necessary to determine patient infection status.  Positive results do  not rule out bacterial infection or co-infection with other viruses. If result is PRESUMPTIVE  POSTIVE SARS-CoV-2 nucleic acids MAY BE PRESENT.   A presumptive positive result was obtained on the submitted specimen  and confirmed on repeat testing.  While 2019 novel coronavirus  (SARS-CoV-2) nucleic acids may be present in the submitted sample  additional confirmatory testing may be necessary for epidemiological  and / or clinical management purposes  to differentiate between  SARS-CoV-2 and other Sarbecovirus currently known to infect humans.  If clinically indicated additional testing with an alternate test  methodology [phone]) is advised. The SARS-CoV-2 RNA is generally  detectable in upper and lower respiratory sp ecimens during the acute  phase of infection. The expected result is Negative. Fact Sheet for Patients:  StrictlyIdeas.no Fact Sheet for Healthcare Providers: BankingDealers.co.za This test is not yet approved or cleared by the [place] FDA and has been authorized for detection and/or diagnosis of SARS-CoV-2 by FDA under an Emergency Use Authorization (EUA).  This EUA will remain in effect (meaning this test can be used) for the duration of the COVID-19 declaration under Section 564(b)(1) of the Act, 21 U.S.C. section 360bbb-3(b)(1), unless the authorization is terminated or revoked sooner. Performed at [facility], [place] [address] [place], [place] [address]   Blood culture (routine x 2)     Status:  None (Preliminary result)   Collection Time: 10/15/18 11:58 AM  Result Value Ref Range Status   Specimen Description BLOOD LEFT ANTECUBITAL  Final   Special Requests   Final    BOTTLES DRAWN AEROBIC AND ANAEROBIC Blood Culture adequate volume   Culture   Final    NO GROWTH < 24 HOURS Performed at [facility], [address], [place], [place] [address]    Report Status PENDING  Incomplete  Blood culture (routine x 2)     Status: None (Preliminary result)   Collection Time: 10/15/18  2:37 PM   Result Value Ref Range Status   Specimen Description BLOOD RIGHT ANTECUBITAL  Final   Special Requests   Final    BOTTLES DRAWN AEROBIC ONLY Blood Culture adequate volume   Culture   Final    NO GROWTH < 24 HOURS Performed at [facility], [place] [address] [place], [place] [address]    Report Status PENDING  Incomplete    Lipid Panel Recent Labs    10/16/18 0625  CHOL 146  TRIG 97  HDL 61  CHOLHDL 2.4  VLDL 19  LDLCALC 66    Studies/Results: Ct Code Stroke Cta Head W/wo Contrast  Result Date: 10/15/2018 CLINICAL DATA:  Code stroke. Last seen normal at 6 o'clock p.m. last night. Aphasia. Left-sided weakness. EXAM: CT ANGIOGRAPHY HEAD AND NECK CT PERFUSION BRAIN TECHNIQUE: Multidetector CT imaging of the head and neck was performed using the standard protocol during bolus administration of intravenous contrast. Multiplanar CT image reconstructions and MIPs were obtained to evaluate the vascular anatomy. Carotid stenosis measurements (when applicable) are obtained utilizing NASCET criteria, using the distal internal carotid diameter as the denominator. Multiphase CT imaging of the brain was performed following IV bolus contrast injection. Subsequent parametric perfusion maps were calculated using RAPID software. CONTRAST:  110mL OMNIPAQUE IOHEXOL 350 MG/ML SOLN COMPARISON:  CT head without contrast 10/15/2018. FINDINGS: CTA NECK FINDINGS Aortic arch: Extensive vascular calcifications are present at the aortic arch and origins of the great vessels. A 3 vessel arch configuration is present. There dense calcifications at the origin of the left common carotid artery with greater than 60% stenosis proximally. There is no significant stenosis at the origin of the innominate or of the left subclavian artery. There is no aneurysm. Right carotid system: Right common carotid artery demonstrates some proximal calcifications from the innominate. There distal mural calcifications proximal  bifurcation. Extensive calcified plaque is present at the right carotid bifurcation without a significant stenosis of greater than 50% relative to the more distal vessel. The more distal right ICA is normal. Left carotid system: The left common carotid artery demonstrates the proximal stenosis. Additional mural calcifications are present without other tandem stenoses. Extensive vascular calcifications are present at the left carotid bifurcation without a significant stenosis of greater than 50% relative to the more distal vessel. The more distal left ICA is normal. Vertebral arteries: The vertebral arteries are codominant. Calcifications are present in the subclavian arteries bilaterally without significant vertebral artery stenoses. There is no significant vertebral artery stenosis or vascular injury in the neck. Skeleton: Endplate degenerative changes and uncovertebral spurring is worse right than left at C5-6 and C6-7. Extensive facet changes are also worse on the right, more in the upper cervical spine. No focal lytic or blastic lesions are present. The patient is status post median sternotomy. Vertebral body heights are maintained. Calvarium is intact. Other neck: No focal mucosal or submucosal lesions are present. Thyroid  and salivary glands are normal. There is no significant adenopathy. Upper chest: Centrilobular emphysematous changes are present. There is no focal nodule, mass, or airspace disease. Review of the MIP images confirms the above findings CTA HEAD FINDINGS Anterior circulation: Posterior circulation: Atherosclerotic calcifications are present within the cavernous internal carotid arteries bilaterally. There is no significant stenosis of either vessel relative to the ICA terminus. The A1 and M1 segments are normal. The anterior communicating artery is patent. MCA bifurcations are intact bilaterally. There is asymmetric attenuation of distal left MCA branch vessels compared to the right. No  significant proximal stenosis or occlusion is present. Mild distal irregularity is present in the ACA branches bilaterally. Venous sinuses: Atherosclerotic calcifications are present at the dural margin of both vertebral arteries. There is a moderate left V3 segment stenosis. Vertebrobasilar junction is normal. PICA origins are visualized and normal. Basilar artery is normal. Both posterior cerebral arteries originate from the basilar tip. There is some attenuation of distal PCA branch vessels without significant proximal stenosis or occlusion. Anatomic variants: None Delayed phase: Review of the MIP images confirms the above findings CT Brain Perfusion Findings: ASPECTS: 10/10 CBF (<30%) Volume: 89mL Perfusion (Tmax>6.0s) volume: 46mL Mismatch Volume: 60mL Infarction Location:There is asymmetric ischemia and watershed distribution over the left convexity. No infarct is present. IMPRESSION: 1. Asymmetric decreased perfusion to the left hemisphere evident by decreased cerebral blood flow in a watershed distribution and decreased collateral vessels visible on the CTA. 2. The most significant proximal stenosis on the left is in the proximal left common carotid artery near the origin. There is a stenosis of at least 60%. 3. Extensive atherosclerotic changes are present at the left carotid bifurcation and left cavernous internal carotid artery without a significant stenosis of greater than 50%. 4. No large vessel occlusion. 5. Moderate left vertebral artery stenosis at the V3 segment. 6. Other significant atherosclerotic calcifications as described without other significant stenoses. 7. Moderate degenerative changes in the cervical spine. These results were called by telephone at the time of interpretation on 10/15/2018 at 9:54 am to Dr. Kerney Elbe , who verbally acknowledged these results. Electronically Signed   By: San Morelle M.D.   On: 10/15/2018 10:03   Ct Code Stroke Cta Neck W/wo Contrast  Result  Date: 10/15/2018 CLINICAL DATA:  Code stroke. Last seen normal at 6 o'clock p.m. last night. Aphasia. Left-sided weakness. EXAM: CT ANGIOGRAPHY HEAD AND NECK CT PERFUSION BRAIN TECHNIQUE: Multidetector CT imaging of the head and neck was performed using the standard protocol during bolus administration of intravenous contrast. Multiplanar CT image reconstructions and MIPs were obtained to evaluate the vascular anatomy. Carotid stenosis measurements (when applicable) are obtained utilizing NASCET criteria, using the distal internal carotid diameter as the denominator. Multiphase CT imaging of the brain was performed following IV bolus contrast injection. Subsequent parametric perfusion maps were calculated using RAPID software. CONTRAST:  179mL OMNIPAQUE IOHEXOL 350 MG/ML SOLN COMPARISON:  CT head without contrast 10/15/2018. FINDINGS: CTA NECK FINDINGS Aortic arch: Extensive vascular calcifications are present at the aortic arch and origins of the great vessels. A 3 vessel arch configuration is present. There dense calcifications at the origin of the left common carotid artery with greater than 60% stenosis proximally. There is no significant stenosis at the origin of the innominate or of the left subclavian artery. There is no aneurysm. Right carotid system: Right common carotid artery demonstrates some proximal calcifications from the innominate. There distal mural calcifications proximal bifurcation. Extensive calcified plaque is present  at the right carotid bifurcation without a significant stenosis of greater than 50% relative to the more distal vessel. The more distal right ICA is normal. Left carotid system: The left common carotid artery demonstrates the proximal stenosis. Additional mural calcifications are present without other tandem stenoses. Extensive vascular calcifications are present at the left carotid bifurcation without a significant stenosis of greater than 50% relative to the more distal vessel.  The more distal left ICA is normal. Vertebral arteries: The vertebral arteries are codominant. Calcifications are present in the subclavian arteries bilaterally without significant vertebral artery stenoses. There is no significant vertebral artery stenosis or vascular injury in the neck. Skeleton: Endplate degenerative changes and uncovertebral spurring is worse right than left at C5-6 and C6-7. Extensive facet changes are also worse on the right, more in the upper cervical spine. No focal lytic or blastic lesions are present. The patient is status post median sternotomy. Vertebral body heights are maintained. Calvarium is intact. Other neck: No focal mucosal or submucosal lesions are present. Thyroid and salivary glands are normal. There is no significant adenopathy. Upper chest: Centrilobular emphysematous changes are present. There is no focal nodule, mass, or airspace disease. Review of the MIP images confirms the above findings CTA HEAD FINDINGS Anterior circulation: Posterior circulation: Atherosclerotic calcifications are present within the cavernous internal carotid arteries bilaterally. There is no significant stenosis of either vessel relative to the ICA terminus. The A1 and M1 segments are normal. The anterior communicating artery is patent. MCA bifurcations are intact bilaterally. There is asymmetric attenuation of distal left MCA branch vessels compared to the right. No significant proximal stenosis or occlusion is present. Mild distal irregularity is present in the ACA branches bilaterally. Venous sinuses: Atherosclerotic calcifications are present at the dural margin of both vertebral arteries. There is a moderate left V3 segment stenosis. Vertebrobasilar junction is normal. PICA origins are visualized and normal. Basilar artery is normal. Both posterior cerebral arteries originate from the basilar tip. There is some attenuation of distal PCA branch vessels without significant proximal stenosis or  occlusion. Anatomic variants: None Delayed phase: Review of the MIP images confirms the above findings CT Brain Perfusion Findings: ASPECTS: 10/10 CBF (<30%) Volume: 49mL Perfusion (Tmax>6.0s) volume: 9mL Mismatch Volume: 67mL Infarction Location:There is asymmetric ischemia and watershed distribution over the left convexity. No infarct is present. IMPRESSION: 1. Asymmetric decreased perfusion to the left hemisphere evident by decreased cerebral blood flow in a watershed distribution and decreased collateral vessels visible on the CTA. 2. The most significant proximal stenosis on the left is in the proximal left common carotid artery near the origin. There is a stenosis of at least 60%. 3. Extensive atherosclerotic changes are present at the left carotid bifurcation and left cavernous internal carotid artery without a significant stenosis of greater than 50%. 4. No large vessel occlusion. 5. Moderate left vertebral artery stenosis at the V3 segment. 6. Other significant atherosclerotic calcifications as described without other significant stenoses. 7. Moderate degenerative changes in the cervical spine. These results were called by telephone at the time of interpretation on 10/15/2018 at 9:54 am to Dr. Kerney Elbe , who verbally acknowledged these results. Electronically Signed   By: San Morelle M.D.   On: 10/15/2018 10:03   Ct Angio Chest Pe W And/or Wo Contrast  Result Date: 10/15/2018 CLINICAL DATA:  Respiratory distress. EXAM: CT ANGIOGRAPHY CHEST WITH CONTRAST TECHNIQUE: Multidetector CT imaging of the chest was performed using the standard protocol during bolus administration of intravenous contrast. Multiplanar  CT image reconstructions and MIPs were obtained to evaluate the vascular anatomy. CONTRAST:  34mL OMNIPAQUE IOHEXOL 350 MG/ML SOLN COMPARISON:  Radiograph of same day.  CT scan of January 06, 2009. FINDINGS: Cardiovascular: Satisfactory opacification of the pulmonary arteries to the  segmental level. No evidence of pulmonary embolism. Normal heart size. No pericardial effusion. Status post coronary bypass graft. Atherosclerosis of thoracic aorta is noted. Mediastinum/Nodes: Moderate size sliding-type hiatal hernia is noted. No adenopathy is noted. Thyroid gland is unremarkable. Lungs/Pleura: Lungs are clear. No pleural effusion or pneumothorax. Upper Abdomen: No acute abnormality. Musculoskeletal: No chest wall abnormality. No acute or significant osseous findings. Review of the MIP images confirms the above findings. IMPRESSION: No definite evidence of pulmonary embolus. Moderate size sliding-type hiatal hernia. Aortic Atherosclerosis (ICD10-I70.0). Electronically Signed   By: Marijo Conception M.D.   On: 10/15/2018 13:25   Mr Brain Wo Contrast  Result Date: 10/16/2018 CLINICAL DATA:  Left-sided weakness and aphasia. EXAM: MRI HEAD WITHOUT CONTRAST TECHNIQUE: Multiplanar, multiecho pulse sequences of the brain and surrounding structures were obtained without intravenous contrast. COMPARISON:  CT and CTA earlier today. FINDINGS: Brain: DWI hyperintensity with isointensity on ADC map (shine through) at the medial right thalamus. Even more subtle diffusion hyperintensity along the parasagittal right frontal lobe involving a limited area. Given this pattern, seizure phenomenon is favored. CT perfusion findings may be related to increased flow on the right rather than decreased on the left. No history of fever to imply an encephalitis. There is bilateral medial temporal volume loss with no evidence of temporal or insular edema to imply herpes. Small remote bilateral cerebellar infarcts. Chronic microvascular ischemia in the cerebral white matter. There is cerebral volume loss most notable in the medial temporal lobes in this patient with history of dementia. No acute hemorrhage, mass, or collection. Vascular: Major flow voids are preserved. Basilar and right PCA were widely patent on prior CTA  Skull and upper cervical spine: Negative for marrow lesion Sinuses/Orbits: Retention cysts in the left maxillary sinus. Right cataract resection. Other: Significantly and progressively motion degraded study which could easily obscure findings. IMPRESSION: 1. Edematous signal in the medial right thalamus and possibly in the parasagittal right frontal lobe, favor seizure phenomenon over ischemia. Please ensure no clinical signs of CNS infection. 2. Atrophy in keeping with history of dementia. There is mild chronic small vessel ischemia in the cerebral white matter. Small remote cerebellar infarcts. 3. Significantly motion degraded study. Electronically Signed   By: Monte Fantasia M.D.   On: 10/16/2018 04:21   Ct Code Stroke Cerebral Perfusion With Contrast  Result Date: 10/15/2018 CLINICAL DATA:  Code stroke. Last seen normal at 6 o'clock p.m. last night. Aphasia. Left-sided weakness. EXAM: CT ANGIOGRAPHY HEAD AND NECK CT PERFUSION BRAIN TECHNIQUE: Multidetector CT imaging of the head and neck was performed using the standard protocol during bolus administration of intravenous contrast. Multiplanar CT image reconstructions and MIPs were obtained to evaluate the vascular anatomy. Carotid stenosis measurements (when applicable) are obtained utilizing NASCET criteria, using the distal internal carotid diameter as the denominator. Multiphase CT imaging of the brain was performed following IV bolus contrast injection. Subsequent parametric perfusion maps were calculated using RAPID software. CONTRAST:  146mL OMNIPAQUE IOHEXOL 350 MG/ML SOLN COMPARISON:  CT head without contrast 10/15/2018. FINDINGS: CTA NECK FINDINGS Aortic arch: Extensive vascular calcifications are present at the aortic arch and origins of the great vessels. A 3 vessel arch configuration is present. There dense calcifications at the origin of the  left common carotid artery with greater than 60% stenosis proximally. There is no significant stenosis  at the origin of the innominate or of the left subclavian artery. There is no aneurysm. Right carotid system: Right common carotid artery demonstrates some proximal calcifications from the innominate. There distal mural calcifications proximal bifurcation. Extensive calcified plaque is present at the right carotid bifurcation without a significant stenosis of greater than 50% relative to the more distal vessel. The more distal right ICA is normal. Left carotid system: The left common carotid artery demonstrates the proximal stenosis. Additional mural calcifications are present without other tandem stenoses. Extensive vascular calcifications are present at the left carotid bifurcation without a significant stenosis of greater than 50% relative to the more distal vessel. The more distal left ICA is normal. Vertebral arteries: The vertebral arteries are codominant. Calcifications are present in the subclavian arteries bilaterally without significant vertebral artery stenoses. There is no significant vertebral artery stenosis or vascular injury in the neck. Skeleton: Endplate degenerative changes and uncovertebral spurring is worse right than left at C5-6 and C6-7. Extensive facet changes are also worse on the right, more in the upper cervical spine. No focal lytic or blastic lesions are present. The patient is status post median sternotomy. Vertebral body heights are maintained. Calvarium is intact. Other neck: No focal mucosal or submucosal lesions are present. Thyroid and salivary glands are normal. There is no significant adenopathy. Upper chest: Centrilobular emphysematous changes are present. There is no focal nodule, mass, or airspace disease. Review of the MIP images confirms the above findings CTA HEAD FINDINGS Anterior circulation: Posterior circulation: Atherosclerotic calcifications are present within the cavernous internal carotid arteries bilaterally. There is no significant stenosis of either vessel  relative to the ICA terminus. The A1 and M1 segments are normal. The anterior communicating artery is patent. MCA bifurcations are intact bilaterally. There is asymmetric attenuation of distal left MCA branch vessels compared to the right. No significant proximal stenosis or occlusion is present. Mild distal irregularity is present in the ACA branches bilaterally. Venous sinuses: Atherosclerotic calcifications are present at the dural margin of both vertebral arteries. There is a moderate left V3 segment stenosis. Vertebrobasilar junction is normal. PICA origins are visualized and normal. Basilar artery is normal. Both posterior cerebral arteries originate from the basilar tip. There is some attenuation of distal PCA branch vessels without significant proximal stenosis or occlusion. Anatomic variants: None Delayed phase: Review of the MIP images confirms the above findings CT Brain Perfusion Findings: ASPECTS: 10/10 CBF (<30%) Volume: 34mL Perfusion (Tmax>6.0s) volume: 10mL Mismatch Volume: 63mL Infarction Location:There is asymmetric ischemia and watershed distribution over the left convexity. No infarct is present. IMPRESSION: 1. Asymmetric decreased perfusion to the left hemisphere evident by decreased cerebral blood flow in a watershed distribution and decreased collateral vessels visible on the CTA. 2. The most significant proximal stenosis on the left is in the proximal left common carotid artery near the origin. There is a stenosis of at least 60%. 3. Extensive atherosclerotic changes are present at the left carotid bifurcation and left cavernous internal carotid artery without a significant stenosis of greater than 50%. 4. No large vessel occlusion. 5. Moderate left vertebral artery stenosis at the V3 segment. 6. Other significant atherosclerotic calcifications as described without other significant stenoses. 7. Moderate degenerative changes in the cervical spine. These results were called by telephone at the  time of interpretation on 10/15/2018 at 9:54 am to Dr. Kerney Elbe , who verbally acknowledged these results. Electronically Signed  By: San Morelle M.D.   On: 10/15/2018 10:03   Dg Chest Portable 1 View  Result Date: 10/15/2018 CLINICAL DATA:  Left-sided weakness vomiting.  Possible aspiration. EXAM: PORTABLE CHEST 1 VIEW COMPARISON:  Radiographs 05/05/2016 and 04/24/2016 FINDINGS: 1032 hours. The heart size and mediastinal contours are stable status post median sternotomy and CABG. There is aortic atherosclerosis. The lungs are clear. There is no pleural effusion or pneumothorax. No acute osseous findings are evident. Prominent bilateral breast capsular calcifications are noted. Telemetry leads overlie the chest. IMPRESSION: Stable chest.  No active cardiopulmonary process. Electronically Signed   By: Richardean Sale M.D.   On: 10/15/2018 10:45   Ct Head Code Stroke Wo Contrast  Result Date: 10/15/2018 CLINICAL DATA:  Code stroke.  Left-sided paralysis and aphasia. EXAM: CT HEAD WITHOUT CONTRAST TECHNIQUE: Contiguous axial images were obtained from the base of the skull through the vertex without intravenous contrast. 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 Chronic small-vessel ischemic changes of the pons. Generalized age related atrophy. Chronic small-vessel ischemic changes of the hemispheric white matter. No sign of acute cortical infarction. No mass lesion, hemorrhage, hydrocephalus or extra-axial collection. Vascular: There is atherosclerotic calcification of the major vessels at the base of the brain. Skull: Negative Sinuses/Orbits: Clear/normal Other: None ASPECTS ([place] Stroke Program Early CT Score) - Ganglionic level infarction (caudate, lentiform nuclei, internal capsule, insula, M1-M3 cortex): 7 - Supraganglionic infarction (M4-M6 cortex): 3 Total score (0-10 with 10 being normal): 10 IMPRESSION: 1. No acute finding by CT.  Atrophy and chronic small-vessel ischemic changes. 2. ASPECTS is 10. 3. These results were communicated to Dr. Cheral Marker at [facility] 5/26/2020by text page via the [facility] messaging system. Electronically Signed   By: Nelson Chimes M.D.   On: 10/15/2018 09:23    Medications:  Scheduled: . aspirin  300 mg Rectal Daily  . enoxaparin (LOVENOX) injection  1 mg/kg Subcutaneous Q24H  . pantoprazole (PROTONIX) IV  40 mg Intravenous Q24H  . sodium chloride flush  3 mL Intravenous Once   Continuous: . sodium chloride 50 mL/hr at 10/16/18 0610  . levETIRAcetam Stopped (10/16/18 1820)  . valproate sodium 281 mg (10/17/18 0309)    Assessment/Recommendations:85 year old female with new onset seizures, now with PLEDs on LTM EEG. Clinically, she has postictal/seizure-related weakness and confusion.  1. Neurological exam in the ED on the day of admission best localized as focal right cerebral hemisphere dysfunction. There was no hemorrhage on CT head and no LVO on CTA. CTP showed right hemisphere hyperperfusion consistent with hypermetabolic state, suggestive of recurrent epileptiform activity.   2. MRI obtained was also concerning for seizure related changes - they involve the medial right thalamus and medial anterior right frontal cortices on DWI, appearing most consistent with cytotoxic edema.   3. Clinical course:  -- The patient was initially loaded with Keppra 3000 mg x 1 and started on 1000 mg BID.   -- Subsequent EEG preliminary review at the bedside yesterday (Wednesday) revealed diffuse delta slowing with some rhythmicity in  portions of the record. Official read by Dr. Merlene Laughter confirmed abnormal EEG as follows: Numerous episodes of right frontal epileptiform  discharges associated with generalized 1 Hz delta slow wave activity, as well as mild global slowing.  -- She was then  loaded with valproic acid 1500 mg and started on scheduled VPA at 5 mg/kg IV TID (ammonia level normal at 29). LTM  EEG was  started to rule out NCSE.  Per Dr. Leonel Ramsay, continued PLEDs overnight despite addition of VPA to her regimen.  4. Will add Vimpat with 200 mg load followed by 100 mg BID. Continue Keppra and VPA at current doses.  5. Given that the seizures are likely to be refractory, in the setting of her dementia, a palliative care consult should be obtained.      LOS: 2 days   @Electronically  signed: Dr. Kerney Elbe 10/17/2018  7:54 AM

## 2018-10-17 NOTE — Progress Notes (Signed)
CM received referral from palliative care that son is wanting comfort care and [facility]. Anderson Malta with [employer] notified. CM following.

## 2018-10-17 NOTE — Progress Notes (Signed)
SLP Cancellation Note  Patient Details Name: [name] MRN: [id number] DOB: 1926-07-06   Cancelled treatment:       Reason Eval/Treat Not Completed: Patient's level of consciousness. Patient continues to not be arousable. Palliative NP has spoken with patient's son regarding POC and started discussion regarding patient's wishes for EOL care. Will attempt to see patient this PM if able to be aroused.   Nadara Mode Tarrell 10/17/2018, 10:28 AM   Sonia Baller, MA, CCC-SLP Speech Therapy [facility] Pager: [phone]

## 2018-10-17 NOTE — Progress Notes (Signed)
EEG maint complete. Continue to monitor

## 2018-10-17 NOTE — Progress Notes (Signed)
PROGRESS NOTE    [name]  HQI:[id number] DOB: 1926/11/21 DOA: 10/15/2018 PCP: Mayra Neer, MD    Brief Narrative:   [name] is a 85 y.o. female with medical history significant of A. fib not on anticoagulation, systolic CHF, CAD s/p CABG, dementia, DM type II, PVD; who presented after being found by son this morning around 8 AM in bed unable to talk and with left-sided weakness.  History is obtained from the son over the phone as patient unable to give history at this time due to her condition.  She had last been seen normal around 6 PM the night before.  At baseline the patient had been living alone, but her son will check on her twice daily making sure she had meals and her medications.  When he arrived this morning he also noted that she had vomited.  He reports that she had previously been on Coumadin, but he does not recall why she was taken off of it.   ED Course: Upon admission to the emergency department patient was seen to have a temperature of 100.3 F pulse 76-123, respiration 15-22, blood pressure elevated up to 194/87, and O2 saturation maintained on 2 L of nasal cannula oxygen.  Labs revealed WBC 14.8, potassium 3.1, BUN 12, creatinine 1.12, and lactic acid 1.  Chest x-ray showed no acute abnormalities.  Patient had been given Zosyn as there was initial concern for aspiration and 2.5 mg of metoprolol IV for rate control.  Neurology have been consulted.  TR[facility] called to admit.   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  CT cerebral  perfusion/angio head/neck scan on admission notable for asymmetric decreased perfusion to left hemisphere, left common carotid artery stenosis 60%, no large vessel occlusion, moderate left vertebral artery stenosis at V3 segment.  MR brain with edematous signal medial right thalamus and possibly parasagittal right frontal lobe which favors seizure phenomenon versus ischemia. --Neurology following, appreciate assistance --EEG with continued right frontal epileptiform discharges, on continuous EEG --Loaded on Keppra 3000 mg IV on 5/26, continues Keppra 1000 mg IV q12h --Loaded on Valproate 1500mg  IV 5/28, continues on 281mg  IV q8h --Aspirin 300 mg per rectum daily --PT/OT following, recommend SNF --Speech following, recommend n.p.o. as she failed swallow evaluation --NS at 8mL/hr --Palliative care following for assistance with goals of care and medical decision making  Acute respiratory failure with hypoxia: Resolved   Patient presented with new oxygen requirement of 2 L nasal cannula oxygen to maintain O2 saturations.  Chest x-ray otherwise clear.  Question possible pulmonary embolus, but CT angiogram of the chest was negative for a PE or signs of pneumonia.  Patient had been given Zosyn x1 dose for suspected aspiration. --Supplemental oxygen now titrated off --Continue to monitor oxygen requirements closely --Incentive spirometry  Paroxysmal atrial fibrillation  Heart rates on admission into the 120s.  Given 2.5 mg of metoprolol IV with improvement in rate. CHA2DS2-VASc score = 8(CHF, HTN, DM, CVA, age, sex). Remote history of being on anticoagulation, but appears to have been discontinued due to dementia, previous report of fall, and social situation  of living alone in 02/2016.  TSH within normal limits. --Currently on treatment dose Lovenox 1 mg/kg daily --Metoprolol IV as needed for rate control  --Continue to monitor on telemetry  Hypokalemia: Acute   Potassium 3.4 today, magnesium  2.0. --Replete potassium today --Monitor daily electrolytes with magnesium  Chronic combined systolic and diastolic congestive heart failure Hx CAD with CABG x4 and left main s/p PCI/stent  Last EF noted to be 45 to 50% in 02/2016.  Patient does not appear to be fluid overloaded at this time.  Home regimen includes aspirin, statin, furosemide 20 mg p.o. daily, Imdur 180mg  PO daily, metoprolol tartrate 25 mg p.o. twice daily. --TTE 5/27 w/ EF 05-39% with diastolic dysfunction --strict I/O's and daily weights  Diabetes mellitus type 2:   On admission glucose 151.  Patient appears to be diet-controlled at baseline. Hemoglobin A1C 6.0 --Continue to monitor for now  GERD --Protonix IV   DVT prophylaxis: Lovenox 1 mg/kg Code Status: DNR Family Communication: updated son [name] by telephone today Disposition Plan: Inpatient hospitalization, further depending on clinical course   Consultants:   Neurology  Palliative care  Procedures:  Transthoracic echocardiogram 10/16/2018 IMPRESSIONS    1. The left ventricle has normal systolic function, with an ejection fraction of 55-60%. The cavity size was normal. Left ventricular diastolic Doppler parameters are consistent with impaired relaxation. Elevated mean left atrial pressure.  2. The right ventricle has normal systolic function. The cavity was normal. There is no increase in right ventricular wall thickness.  3. Left atrial size was mildly dilated.  4. No evidence of mitral valve stenosis.  5. The aortic valve is tricuspid. Mild thickening of the aortic valve. Mild calcification of the aortic valve. Mild stenosis of the aortic valve.  6. The ascending aorta is normal in size and structure.  Antimicrobials:   Zosyn x 1 dose on admission   Subjective:  Patient seen and examined at bedside this morning, continues to be unresponsive to verbal command.  Continues on continuous EEG.  Does open eyes sporadically.  No significant  change per nursing staff overnight.  Neurology started on the second antiepileptic with valproate with reports of continued epileptiform discharges localized to the right frontal region.  Believes patient may be experiencing nonconvulsive status epilepticus. Continues on IV Keppra.  No further ROS obtained due to current status.  Objective: Vitals:   10/16/18 2016 10/17/18 0012 10/17/18 0357 10/17/18 0814  BP: (!) 141/88 (!) 132/59 (!) 157/71 (!) 157/74  Pulse: 82 (!) 51 81 80  Resp: 15 16 15 16   Temp: 97.6 F (36.4 C) 97.6 F (36.4 C) 97.8 F (36.6 C) 97.7 F (36.5 C)  TempSrc: Oral Oral Oral Oral  SpO2: 95% 97% 93% 96%  Weight:      Height:        Intake/Output Summary (Last 24 hours) at 10/17/2018 1205 Last data filed at 10/17/2018 7673 Gross per 24 hour  Intake 1208.93 ml  Output 700 ml  Net 508.93 ml   Filed Weights   10/15/18 1300  Weight: 56.2 kg    Examination:  General exam: Unresponsive to verbal or painful stimuli, will open eyes spontaneously Respiratory system: Clear to auscultation. Respiratory effort normal.  On room air Cardiovascular system: S1 & S2 heard, irregularly irregular rhythm, normal rate. No JVD, murmurs, rubs, gallops or clicks. No pedal edema. Gastrointestinal system: Abdomen is nondistended, soft and nontender. No organomegaly or masses felt. Normal bowel sounds heard. Central nervous system: Nonresponsive to verbal command  or painful stimulus, pupils equal round reactive to light. Extremities: No peripheral edema Skin: No rashes, lesions or ulcers Psychiatry: Nonresponsive    Data Reviewed: I have personally reviewed following labs and imaging studies  CBC: Recent Labs  Lab 10/15/18 0903 10/15/18 0913 10/15/18 1029 10/17/18 0440  WBC 14.8*  --   --  12.0*  NEUTROABS 13.4*  --   --   --   HGB 14.8 15.3* 15.6* 17.2*  HCT 44.4 45.0 46.0 50.1*  MCV 87.2  --   --  84.6  PLT 332  --   --  034   Basic Metabolic Panel: Recent Labs    Lab 10/15/18 0903 10/15/18 0913 10/15/18 1029 10/15/18 1437 10/17/18 0943  NA 138 137 138  --  141  K 3.3* 3.2* 3.1*  --  3.2*  CL 98 100  --   --  101  CO2 26  --   --   --  31  GLUCOSE 150* 152*  --   --  81  BUN 12 14  --   --  22  CREATININE 1.12* 1.00  --   --  0.92  CALCIUM 9.9  --   --   --  8.9  MG  --   --   --  1.9 2.0   GFR: Estimated Creatinine Clearance: 32.8 mL/min (by C-G formula based on SCr of 0.92 mg/dL). Liver Function Tests: Recent Labs  Lab 10/15/18 0903  AST 36  ALT 20  ALKPHOS 94  BILITOT 1.2  PROT 7.5  ALBUMIN 4.3   No results for input(s): LIPASE, AMYLASE in the last 168 hours. Recent Labs  Lab 10/16/18 1857  AMMONIA 29   Coagulation Profile: Recent Labs  Lab 10/15/18 0903  INR 0.9   Cardiac Enzymes: Recent Labs  Lab 10/17/18 0943  CKTOTAL 241*   BNP (last 3 results) No results for input(s): PROBNP in the last 8760 hours. HbA1C: Recent Labs    10/16/18 0625  HGBA1C 6.0*   CBG: Recent Labs  Lab 10/15/18 0905 10/15/18 1703  GLUCAP 143* 143*   Lipid Profile: Recent Labs    10/16/18 0625  CHOL 146  HDL 61  LDLCALC 66  TRIG 97  CHOLHDL 2.4   Thyroid Function Tests: Recent Labs    10/15/18 1437  TSH 0.600   Anemia Panel: No results for input(s): VITAMINB12, FOLATE, FERRITIN, TIBC, IRON, RETICCTPCT in the last 72 hours. Sepsis Labs: Recent Labs  Lab 10/15/18 0911 10/15/18 1156  LATICACIDVEN 1.0 <0.3*    Recent Results (from the past 240 hour(s))  SARS Coronavirus 2 (CEPHEID- Performed in [place] hospital lab), Hosp Order     Status: None   Collection Time: 10/15/18  9:42 AM  Result Value Ref Range Status   SARS Coronavirus 2 NEGATIVE NEGATIVE Final    Comment: (NOTE) If result is NEGATIVE SARS-CoV-2 target nucleic acids are NOT DETECTED. The SARS-CoV-2 RNA is generally detectable in upper and lower  respiratory specimens during the acute phase of infection. The lowest  concentration of SARS-CoV-2  viral copies this assay can detect is 250  copies / mL. A negative result does not preclude SARS-CoV-2 infection  and should not be used as the sole basis for treatment or other  patient management decisions.  A negative result may occur with  improper specimen collection / handling, submission of specimen other  than nasopharyngeal swab, presence of viral mutation(s) within the  areas targeted by this assay, and inadequate number  of viral copies  (<250 copies / mL). A negative result must be combined with clinical  observations, patient history, and epidemiological information. If result is POSITIVE SARS-CoV-2 target nucleic acids are DETECTED. The SARS-CoV-2 RNA is generally detectable in upper and lower  respiratory specimens dur ing the acute phase of infection.  Positive  results are indicative of active infection with SARS-CoV-2.  Clinical  correlation with patient history and other diagnostic information is  necessary to determine patient infection status.  Positive results do  not rule out bacterial infection or co-infection with other viruses. If result is PRESUMPTIVE POSTIVE SARS-CoV-2 nucleic acids MAY BE PRESENT.   A presumptive positive result was obtained on the submitted specimen  and confirmed on repeat testing.  While 2019 novel coronavirus  (SARS-CoV-2) nucleic acids may be present in the submitted sample  additional confirmatory testing may be necessary for epidemiological  and / or clinical management purposes  to differentiate between  SARS-CoV-2 and other Sarbecovirus currently known to infect humans.  If clinically indicated additional testing with an alternate test  methodology [phone]) is advised. The SARS-CoV-2 RNA is generally  detectable in upper and lower respiratory sp ecimens during the acute  phase of infection. The expected result is Negative. Fact Sheet for Patients:  StrictlyIdeas.no Fact Sheet for Healthcare  Providers: BankingDealers.co.za This test is not yet approved or cleared by the [place] FDA and has been authorized for detection and/or diagnosis of SARS-CoV-2 by FDA under an Emergency Use Authorization (EUA).  This EUA will remain in effect (meaning this test can be used) for the duration of the COVID-19 declaration under Section 564(b)(1) of the Act, 21 U.S.C. section 360bbb-3(b)(1), unless the authorization is terminated or revoked sooner. Performed at [facility], [place] [address], [place], [place] [address]   Blood culture (routine x 2)     Status: None (Preliminary result)   Collection Time: 10/15/18 11:58 AM  Result Value Ref Range Status   Specimen Description BLOOD LEFT ANTECUBITAL  Final   Special Requests   Final    BOTTLES DRAWN AEROBIC AND ANAEROBIC Blood Culture adequate volume   Culture   Final    NO GROWTH < 24 HOURS Performed at [facility], [place] [address] [place], [place] [address]    Report Status PENDING  Incomplete  Blood culture (routine x 2)     Status: None (Preliminary result)   Collection Time: 10/15/18  2:37 PM  Result Value Ref Range Status   Specimen Description BLOOD RIGHT ANTECUBITAL  Final   Special Requests   Final    BOTTLES DRAWN AEROBIC ONLY Blood Culture adequate volume   Culture   Final    NO GROWTH < 24 HOURS Performed at [facility], [place] [address], [place], [place] [address]    Report Status PENDING  Incomplete         Radiology Studies: Ct Angio Chest Pe W And/or Wo Contrast  Result Date: 10/15/2018 CLINICAL DATA:  Respiratory distress. EXAM: CT ANGIOGRAPHY CHEST WITH CONTRAST TECHNIQUE: Multidetector CT imaging of the chest was performed using the standard protocol during bolus administration of intravenous contrast. Multiplanar CT image reconstructions and MIPs were obtained to evaluate the vascular anatomy. CONTRAST:  98mL OMNIPAQUE IOHEXOL 350 MG/ML SOLN COMPARISON:  Radiograph of  same day.  CT scan of January 06, 2009. FINDINGS: Cardiovascular: Satisfactory opacification of the pulmonary arteries to the segmental level. No evidence of pulmonary embolism. Normal heart size. No pericardial effusion.  Status post coronary bypass graft. Atherosclerosis of thoracic aorta is noted. Mediastinum/Nodes: Moderate size sliding-type hiatal hernia is noted. No adenopathy is noted. Thyroid gland is unremarkable. Lungs/Pleura: Lungs are clear. No pleural effusion or pneumothorax. Upper Abdomen: No acute abnormality. Musculoskeletal: No chest wall abnormality. No acute or significant osseous findings. Review of the MIP images confirms the above findings. IMPRESSION: No definite evidence of pulmonary embolus. Moderate size sliding-type hiatal hernia. Aortic Atherosclerosis (ICD10-I70.0). Electronically Signed   By: Marijo Conception M.D.   On: 10/15/2018 13:25   Mr Brain Wo Contrast  Result Date: 10/16/2018 CLINICAL DATA:  Left-sided weakness and aphasia. EXAM: MRI HEAD WITHOUT CONTRAST TECHNIQUE: Multiplanar, multiecho pulse sequences of the brain and surrounding structures were obtained without intravenous contrast. COMPARISON:  CT and CTA earlier today. FINDINGS: Brain: DWI hyperintensity with isointensity on ADC map (shine through) at the medial right thalamus. Even more subtle diffusion hyperintensity along the parasagittal right frontal lobe involving a limited area. Given this pattern, seizure phenomenon is favored. CT perfusion findings may be related to increased flow on the right rather than decreased on the left. No history of fever to imply an encephalitis. There is bilateral medial temporal volume loss with no evidence of temporal or insular edema to imply herpes. Small remote bilateral cerebellar infarcts. Chronic microvascular ischemia in the cerebral white matter. There is cerebral volume loss most notable in the medial temporal lobes in this patient with history of dementia. No acute  hemorrhage, mass, or collection. Vascular: Major flow voids are preserved. Basilar and right PCA were widely patent on prior CTA Skull and upper cervical spine: Negative for marrow lesion Sinuses/Orbits: Retention cysts in the left maxillary sinus. Right cataract resection. Other: Significantly and progressively motion degraded study which could easily obscure findings. IMPRESSION: 1. Edematous signal in the medial right thalamus and possibly in the parasagittal right frontal lobe, favor seizure phenomenon over ischemia. Please ensure no clinical signs of CNS infection. 2. Atrophy in keeping with history of dementia. There is mild chronic small vessel ischemia in the cerebral white matter. Small remote cerebellar infarcts. 3. Significantly motion degraded study. Electronically Signed   By: Monte Fantasia M.D.   On: 10/16/2018 04:21        Scheduled Meds:  aspirin  300 mg Rectal Daily   enoxaparin (LOVENOX) injection  1 mg/kg Subcutaneous Q24H   pantoprazole (PROTONIX) IV  40 mg Intravenous Q24H   sodium chloride flush  3 mL Intravenous Once   Continuous Infusions:  sodium chloride 50 mL/hr at 10/16/18 0610   lacosamide (VIMPAT) IV     levETIRAcetam 1,000 mg (10/17/18 1029)   potassium chloride     valproate sodium 281 mg (10/17/18 0309)     LOS: 2 days    Time spent: 29 minutes    Akemi Overholser J [place] DO [facility] Pager [phone]  If 7PM-7AM, please contact night-coverage [email or web address] Password [facility] 10/17/2018, 12:05 PM

## 2018-10-17 NOTE — Progress Notes (Signed)
Palliative:  Ms. Laffey continues to be unresponsive during my assessment. Per RN she does open her eyes spontaneously but not to command and not tracking or following any commands.   I called and spoke further with son, [name]. I explained that his mother's EEG is consistent with seizure activity and that she unfortunately continues to have ongoing seizures. Her assessment has not improved and I fear the damage of ongoing seizure activity with underlying dementia is a very poor prognosis. [name] understands and agrees that they would prefer that his mother transition to hospice facility if possible. She would not want her life prolonged in this state. Goals are very clear. Will add PRN medication to ensure comfort as this is their first priority. [name] is calling his sister to come from out of town to visit with their mother at EOL. He will share my contact with sister if she has any questions or concerns. All questions/concerns addressed. Emotional support provided.   Exam: Unresponsive. No distress.   Plan: - Comfort care - Transition to [facility] for EOL care - Scheduled SL ativan for seizure control at hospice.  - PRN morphine for pain/dyspnea control.   **Update: Neurology wishes to maintain her in hospital to continue attempts to manage seizures. However, likely to transition to [facility].   25 min  Vinie Sill, NP Palliative Medicine Team Pager # [phone] (M-F 8a-5p) Team Phone # [phone] (Nights/Weekends)

## 2018-10-18 DIAGNOSIS — Z515 Encounter for palliative care: Secondary | ICD-10-CM

## 2018-10-18 MED ORDER — LORAZEPAM 1 MG PO TABS: 1.0000 mg | ORAL_TABLET | ORAL | 0 refills | Status: DC | PRN

## 2018-10-18 MED ORDER — GLYCOPYRROLATE 0.2 MG/ML IJ SOLN: 0.4000 mg | INTRAMUSCULAR | 0 refills | Status: AC

## 2018-10-18 MED ORDER — LACOSAMIDE 10 MG/ML PO SOLN: 100.0000 mg | Freq: Two times a day (BID) | ORAL | 0 refills | Status: AC

## 2018-10-18 MED ORDER — MORPHINE SULFATE (CONCENTRATE) 10 MG /0.5 ML PO SOLN: 5.0000 mg | ORAL | 0 refills | Status: AC | PRN

## 2018-10-18 MED ORDER — MORPHINE SULFATE (PF) 2 MG/ML IV SOLN: 2.0000 mg | INTRAVENOUS | Status: DC | PRN

## 2018-10-18 MED ORDER — GLYCOPYRROLATE 0.2 MG/ML IJ SOLN
0.4000 mg | INTRAMUSCULAR | Status: DC
  Administered 2018-10-18 (×2): 0.4 mg via INTRAVENOUS
  Filled 2018-10-18 (×2): qty 2

## 2018-10-18 MED ORDER — LEVETIRACETAM 1000 MG PO TABS: 1000.0000 mg | ORAL_TABLET | Freq: Two times a day (BID) | ORAL | 0 refills | Status: AC

## 2018-10-18 MED ORDER — LORAZEPAM 2 MG/ML IJ SOLN: 1.0000 mg | INTRAMUSCULAR | Status: DC | PRN

## 2018-10-18 MED ORDER — LORAZEPAM 2 MG/ML PO CONC: 1.0000 mg | Freq: Four times a day (QID) | ORAL | 0 refills | Status: AC

## 2018-10-18 MED ORDER — MORPHINE SULFATE (CONCENTRATE) 10 MG /0.5 ML PO SOLN: 5.0000 mg | ORAL | 0 refills | Status: DC | PRN

## 2018-10-18 MED ORDER — MORPHINE SULFATE (CONCENTRATE) 10 MG/0.5ML PO SOLN: 5.0000 mg | ORAL | Status: DC | PRN

## 2018-10-18 MED ORDER — MORPHINE SULFATE (PF) 2 MG/ML IV SOLN
1.0000 mg | INTRAVENOUS | Status: DC
  Administered 2018-10-18 (×2): 1 mg via INTRAVENOUS
  Filled 2018-10-18 (×2): qty 1

## 2018-10-18 MED ORDER — VALPROIC ACID 250 MG/5ML PO SOLN: 250.0000 mg | Freq: Three times a day (TID) | ORAL | 0 refills | Status: AC

## 2018-10-18 MED ORDER — LORAZEPAM 2 MG/ML PO CONC
1.0000 mg | Freq: Four times a day (QID) | ORAL | Status: DC
  Filled 2018-10-18: qty 0.5

## 2018-10-18 NOTE — Care Management Important Message (Signed)
Important Message  Patient Details  Name: [name] MRN: [id number] Date of Birth: 27-Oct-1926   Medicare Important Message Given:  Yes    Kahlil Cowans [address] 10/18/2018, 3:26 PM

## 2018-10-18 NOTE — Progress Notes (Signed)
Occupational Therapy Discharge Patient Details Name: [name] MRN: [id number] DOB: [birth year] Today's Date: 10/18/2018 Time:  -     Patient discharged from OT services secondary to medical decline - will need to re-order OT to resume therapy services.  Please see latest therapy progress note for current level of functioning and progress toward goals.    Progress and discharge plan discussed with patient and/or caregiver: Patient/Caregiver agrees with plan, per palliative care note plan is now to transition to comfort care.  [place], [place] Pager [phone] Office [place] 10/18/2018, 8:32 AM

## 2018-10-18 NOTE — Discharge Summary (Addendum)
Physician Discharge Summary  TYMBER STALLINGS WNI:[id number] DOB: 10-May-1927 DOA: 10/15/2018  PCP: Mayra Neer, MD  Admit date: 10/15/2018 Discharge date: 10/18/2018  Admitted From: Home Disposition:  [place]   Discharge Condition: Hospice CODE STATUS: DNR Diet recommendation: Regular if can tolerate  History of present illness:  Ariel C Randlemanis a 85 y.o.femalewith medical history significant ofA. fib not on anticoagulation, systolic CHF, CAD s/p CABG, dementia, DM type II, PVD;who presented after being found by son this morning around 8 AM in bed unable to talk and with left-sided weakness.History is obtained from the son over the phone as patient unable to give history at this time due to her condition. She had last been seen normal around 6 PM the night before. At baseline the patient had been living alone, but her son will check on her twice daily making sure she had meals and her medications. When he arrived this morning he also noted that she had vomited. He reports that she had previously been on Coumadin, but he does not recall why she was taken off of it.  Upon admission to the emergency department patient was seen to have a temperature of 100.3 F pulse 76-123, respiration 15-22, blood pressure elevated up to 194/87, and O2 saturation maintained on 2 L of nasal cannula oxygen. Labs revealed WBC 14.8,potassium 3.1,BUN 12, creatinine 1.12, and lactic acid 1.Chest x-ray showed no acute abnormalities. Patient had been given Zosyn as there was initial concern for aspiration and 2.5 mg of metoprolol IV for rate control. Neurology have been consulted. [facility] called to admit.  Hospital course:  Acute metabolic encephalopathy Altered mental status CVA versus nonconvulsive status epilepticus Patient presenting from home with left-sided weakness and aphasia.  CT cerebral perfusion/angio head/neck scan on admission notable for asymmetric  decreased perfusion to left hemisphere, left common carotid artery stenosis 60%, no large vessel occlusion, moderate left vertebral artery stenosis at V3 segment.  MR brain with edematous signal medial right thalamus and possibly parasagittal right frontal lobe which favors seizure phenomenon versus ischemia.  Neurology followed during the hospital course.  EEG was notable for right frontal epileptiform discharges.  Patient was initially started on Keppra.  Continued EEG showed persistent discharges in which the patient was subsequently placed on valproic acid and Vimpat.  Despite antiepileptic therapy, patient continued to be unresponsive.  Palliative care was consulted for assistance with goals of care and medical decision-making.  Patient's healthcare power of attorney, her son [name] decided to transition her care to more comfort measures as were the patient's wishes given her current state.  Patient will discharge to hospice at [facility].  Neurology recommends continuation of Keppra, Vimpat, and valproic acid if able.  Patient also be given prescription for sublingual Ativan and morphine as needed for symptomatic relief of shortness of breath, pain, and anxiety.  Acute respiratory failurewith hypoxia: Resolved  Patient presented with new oxygen requirement of 2 L nasal cannula oxygen to maintain O2 saturations. Chest x-ray otherwise clear. COVID-19 was negative. Question possible pulmonary embolus, but CT angiogram of the chest was negative for a PE or signs of pneumonia. Patient had been given Zosyn x1 dose for suspected aspiration. Supplemental oxygen now titrated off.  Transition to hospice as above.  Chronic combined systolic and diastolic congestive heart failure Hx CAD with CABG x4 and left main s/p PCI/stent  Last EF noted to be 45 to 50% in 02/2016. Patient does not appear to be fluid overloaded at this time.  Home regimen includes aspirin, statin, furosemide 20 mg p.o. daily, Imdur  180mg  PO daily, metoprolol tartrate 25 mg p.o. twice daily. TTE 5/27 w/ EF 38-46% with diastolic dysfunction.  Home medications now discontinued as transitioning to hospice.   Discharge Diagnoses:  Principal Problem:   CVA (cerebral vascular accident) [facility]) Active Problems:   Type 2 diabetes mellitus with vascular disease ([place])   Dementia ([place])   Goals of care, counseling/discussion   Palliative care encounter   Seizures [facility])    Discharge Instructions  Discharge Instructions    Diet - low sodium heart healthy   Complete by:  As directed      Allergies as of 10/18/2018      Reactions   Clindamycin/lincomycin Rash, Other (See Comments)   Heart problems       Medication List    STOP taking these medications   amLODipine 5 MG tablet Commonly known as:  NORVASC   aspirin 81 MG EC tablet   atorvastatin 20 MG tablet Commonly known as:  LIPITOR   feeding supplement (ENSURE ENLIVE) Liqd   furosemide 20 MG tablet Commonly known as:  LASIX   hydrALAZINE 50 MG tablet Commonly known as:  APRESOLINE   isosorbide mononitrate 60 MG 24 hr tablet Commonly known as:  IMDUR   lidocaine 5 % Commonly known as:  LIDODERM   memantine 10 MG tablet Commonly known as:  NAMENDA   metoprolol tartrate 25 MG tablet Commonly known as:  LOPRESSOR   pantoprazole 20 MG tablet Commonly known as:  PROTONIX   polyethylene glycol 17 g packet Commonly known as:  MIRALAX / GLYCOLAX   potassium chloride SA 20 MEQ tablet Commonly known as:  K-DUR     TAKE these medications   glycopyrrolate 0.2 MG/ML injection Commonly known as:  ROBINUL Inject 2 mLs (0.4 mg total) into the vein every 4 (four) hours.   lacosamide 10 MG/ML oral solution Commonly known as:  Vimpat Take 10 mLs (100 mg total) by mouth 2 (two) times daily for 30 days.   levETIRAcetam 1000 MG tablet Commonly known as:  Keppra Take 1 tablet (1,000 mg total) by mouth 2 (two) times daily.   LORazepam 2 MG/ML concentrated  solution Commonly known as:  ATIVAN Take 0.5 mLs (1 mg total) by mouth every 6 (six) hours.   morphine CONCENTRATE 10 mg / 0.5 ml concentrated solution Place 0.25 mLs (5 mg total) under the tongue every 2 (two) hours as needed for up to 7 days for severe pain, anxiety or shortness of breath.   valproic acid 250 MG/5ML Soln solution Commonly known as:  DEPAKENE Take 5 mLs (250 mg total) by mouth 3 (three) times daily for 30 days.       Allergies  Allergen Reactions  . Clindamycin/Lincomycin Rash and Other (See Comments)    Heart problems     Consultations:  Neurology, Dr. Earnestine Leys  Palliative care   Procedures/Studies: Ct Code Stroke Cta Head W/wo Contrast  Result Date: 10/15/2018 CLINICAL DATA:  Code stroke. Last seen normal at 6 o'clock p.m. last night. Aphasia. Left-sided weakness. EXAM: CT ANGIOGRAPHY HEAD AND NECK CT PERFUSION BRAIN TECHNIQUE: Multidetector CT imaging of the head and neck was performed using the standard protocol during bolus administration of intravenous contrast. Multiplanar CT image reconstructions and MIPs were obtained to evaluate the vascular anatomy. Carotid stenosis measurements (when applicable) are obtained utilizing NASCET criteria, using the distal internal carotid diameter as the denominator. Multiphase CT imaging of the brain was  performed following IV bolus contrast injection. Subsequent parametric perfusion maps were calculated using RAPID software. CONTRAST:  186mL OMNIPAQUE IOHEXOL 350 MG/ML SOLN COMPARISON:  CT head without contrast 10/15/2018. FINDINGS: CTA NECK FINDINGS Aortic arch: Extensive vascular calcifications are present at the aortic arch and origins of the great vessels. A 3 vessel arch configuration is present. There dense calcifications at the origin of the left common carotid artery with greater than 60% stenosis proximally. There is no significant stenosis at the origin of the innominate or of the left subclavian artery. There is no  aneurysm. Right carotid system: Right common carotid artery demonstrates some proximal calcifications from the innominate. There distal mural calcifications proximal bifurcation. Extensive calcified plaque is present at the right carotid bifurcation without a significant stenosis of greater than 50% relative to the more distal vessel. The more distal right ICA is normal. Left carotid system: The left common carotid artery demonstrates the proximal stenosis. Additional mural calcifications are present without other tandem stenoses. Extensive vascular calcifications are present at the left carotid bifurcation without a significant stenosis of greater than 50% relative to the more distal vessel. The more distal left ICA is normal. Vertebral arteries: The vertebral arteries are codominant. Calcifications are present in the subclavian arteries bilaterally without significant vertebral artery stenoses. There is no significant vertebral artery stenosis or vascular injury in the neck. Skeleton: Endplate degenerative changes and uncovertebral spurring is worse right than left at C5-6 and C6-7. Extensive facet changes are also worse on the right, more in the upper cervical spine. No focal lytic or blastic lesions are present. The patient is status post median sternotomy. Vertebral body heights are maintained. Calvarium is intact. Other neck: No focal mucosal or submucosal lesions are present. Thyroid and salivary glands are normal. There is no significant adenopathy. Upper chest: Centrilobular emphysematous changes are present. There is no focal nodule, mass, or airspace disease. Review of the MIP images confirms the above findings CTA HEAD FINDINGS Anterior circulation: Posterior circulation: Atherosclerotic calcifications are present within the cavernous internal carotid arteries bilaterally. There is no significant stenosis of either vessel relative to the ICA terminus. The A1 and M1 segments are normal. The anterior  communicating artery is patent. MCA bifurcations are intact bilaterally. There is asymmetric attenuation of distal left MCA branch vessels compared to the right. No significant proximal stenosis or occlusion is present. Mild distal irregularity is present in the ACA branches bilaterally. Venous sinuses: Atherosclerotic calcifications are present at the dural margin of both vertebral arteries. There is a moderate left V3 segment stenosis. Vertebrobasilar junction is normal. PICA origins are visualized and normal. Basilar artery is normal. Both posterior cerebral arteries originate from the basilar tip. There is some attenuation of distal PCA branch vessels without significant proximal stenosis or occlusion. Anatomic variants: None Delayed phase: Review of the MIP images confirms the above findings CT Brain Perfusion Findings: ASPECTS: 10/10 CBF (<30%) Volume: 26mL Perfusion (Tmax>6.0s) volume: 13mL Mismatch Volume: 33mL Infarction Location:There is asymmetric ischemia and watershed distribution over the left convexity. No infarct is present. IMPRESSION: 1. Asymmetric decreased perfusion to the left hemisphere evident by decreased cerebral blood flow in a watershed distribution and decreased collateral vessels visible on the CTA. 2. The most significant proximal stenosis on the left is in the proximal left common carotid artery near the origin. There is a stenosis of at least 60%. 3. Extensive atherosclerotic changes are present at the left carotid bifurcation and left cavernous internal carotid artery without a significant stenosis of  greater than 50%. 4. No large vessel occlusion. 5. Moderate left vertebral artery stenosis at the V3 segment. 6. Other significant atherosclerotic calcifications as described without other significant stenoses. 7. Moderate degenerative changes in the cervical spine. These results were called by telephone at the time of interpretation on 10/15/2018 at 9:54 am to Dr. Kerney Elbe , who  verbally acknowledged these results. Electronically Signed   By: San Morelle M.D.   On: 10/15/2018 10:03   Ct Code Stroke Cta Neck W/wo Contrast  Result Date: 10/15/2018 CLINICAL DATA:  Code stroke. Last seen normal at 6 o'clock p.m. last night. Aphasia. Left-sided weakness. EXAM: CT ANGIOGRAPHY HEAD AND NECK CT PERFUSION BRAIN TECHNIQUE: Multidetector CT imaging of the head and neck was performed using the standard protocol during bolus administration of intravenous contrast. Multiplanar CT image reconstructions and MIPs were obtained to evaluate the vascular anatomy. Carotid stenosis measurements (when applicable) are obtained utilizing NASCET criteria, using the distal internal carotid diameter as the denominator. Multiphase CT imaging of the brain was performed following IV bolus contrast injection. Subsequent parametric perfusion maps were calculated using RAPID software. CONTRAST:  162mL OMNIPAQUE IOHEXOL 350 MG/ML SOLN COMPARISON:  CT head without contrast 10/15/2018. FINDINGS: CTA NECK FINDINGS Aortic arch: Extensive vascular calcifications are present at the aortic arch and origins of the great vessels. A 3 vessel arch configuration is present. There dense calcifications at the origin of the left common carotid artery with greater than 60% stenosis proximally. There is no significant stenosis at the origin of the innominate or of the left subclavian artery. There is no aneurysm. Right carotid system: Right common carotid artery demonstrates some proximal calcifications from the innominate. There distal mural calcifications proximal bifurcation. Extensive calcified plaque is present at the right carotid bifurcation without a significant stenosis of greater than 50% relative to the more distal vessel. The more distal right ICA is normal. Left carotid system: The left common carotid artery demonstrates the proximal stenosis. Additional mural calcifications are present without other tandem  stenoses. Extensive vascular calcifications are present at the left carotid bifurcation without a significant stenosis of greater than 50% relative to the more distal vessel. The more distal left ICA is normal. Vertebral arteries: The vertebral arteries are codominant. Calcifications are present in the subclavian arteries bilaterally without significant vertebral artery stenoses. There is no significant vertebral artery stenosis or vascular injury in the neck. Skeleton: Endplate degenerative changes and uncovertebral spurring is worse right than left at C5-6 and C6-7. Extensive facet changes are also worse on the right, more in the upper cervical spine. No focal lytic or blastic lesions are present. The patient is status post median sternotomy. Vertebral body heights are maintained. Calvarium is intact. Other neck: No focal mucosal or submucosal lesions are present. Thyroid and salivary glands are normal. There is no significant adenopathy. Upper chest: Centrilobular emphysematous changes are present. There is no focal nodule, mass, or airspace disease. Review of the MIP images confirms the above findings CTA HEAD FINDINGS Anterior circulation: Posterior circulation: Atherosclerotic calcifications are present within the cavernous internal carotid arteries bilaterally. There is no significant stenosis of either vessel relative to the ICA terminus. The A1 and M1 segments are normal. The anterior communicating artery is patent. MCA bifurcations are intact bilaterally. There is asymmetric attenuation of distal left MCA branch vessels compared to the right. No significant proximal stenosis or occlusion is present. Mild distal irregularity is present in the ACA branches bilaterally. Venous sinuses: Atherosclerotic calcifications are present at the  dural margin of both vertebral arteries. There is a moderate left V3 segment stenosis. Vertebrobasilar junction is normal. PICA origins are visualized and normal. Basilar artery  is normal. Both posterior cerebral arteries originate from the basilar tip. There is some attenuation of distal PCA branch vessels without significant proximal stenosis or occlusion. Anatomic variants: None Delayed phase: Review of the MIP images confirms the above findings CT Brain Perfusion Findings: ASPECTS: 10/10 CBF (<30%) Volume: 23mL Perfusion (Tmax>6.0s) volume: 56mL Mismatch Volume: 78mL Infarction Location:There is asymmetric ischemia and watershed distribution over the left convexity. No infarct is present. IMPRESSION: 1. Asymmetric decreased perfusion to the left hemisphere evident by decreased cerebral blood flow in a watershed distribution and decreased collateral vessels visible on the CTA. 2. The most significant proximal stenosis on the left is in the proximal left common carotid artery near the origin. There is a stenosis of at least 60%. 3. Extensive atherosclerotic changes are present at the left carotid bifurcation and left cavernous internal carotid artery without a significant stenosis of greater than 50%. 4. No large vessel occlusion. 5. Moderate left vertebral artery stenosis at the V3 segment. 6. Other significant atherosclerotic calcifications as described without other significant stenoses. 7. Moderate degenerative changes in the cervical spine. These results were called by telephone at the time of interpretation on 10/15/2018 at 9:54 am to Dr. Kerney Elbe , who verbally acknowledged these results. Electronically Signed   By: San Morelle M.D.   On: 10/15/2018 10:03   Ct Angio Chest Pe W And/or Wo Contrast  Result Date: 10/15/2018 CLINICAL DATA:  Respiratory distress. EXAM: CT ANGIOGRAPHY CHEST WITH CONTRAST TECHNIQUE: Multidetector CT imaging of the chest was performed using the standard protocol during bolus administration of intravenous contrast. Multiplanar CT image reconstructions and MIPs were obtained to evaluate the vascular anatomy. CONTRAST:  93mL OMNIPAQUE IOHEXOL  350 MG/ML SOLN COMPARISON:  Radiograph of same day.  CT scan of January 06, 2009. FINDINGS: Cardiovascular: Satisfactory opacification of the pulmonary arteries to the segmental level. No evidence of pulmonary embolism. Normal heart size. No pericardial effusion. Status post coronary bypass graft. Atherosclerosis of thoracic aorta is noted. Mediastinum/Nodes: Moderate size sliding-type hiatal hernia is noted. No adenopathy is noted. Thyroid gland is unremarkable. Lungs/Pleura: Lungs are clear. No pleural effusion or pneumothorax. Upper Abdomen: No acute abnormality. Musculoskeletal: No chest wall abnormality. No acute or significant osseous findings. Review of the MIP images confirms the above findings. IMPRESSION: No definite evidence of pulmonary embolus. Moderate size sliding-type hiatal hernia. Aortic Atherosclerosis (ICD10-I70.0). Electronically Signed   By: Marijo Conception M.D.   On: 10/15/2018 13:25   Mr Brain Wo Contrast  Result Date: 10/16/2018 CLINICAL DATA:  Left-sided weakness and aphasia. EXAM: MRI HEAD WITHOUT CONTRAST TECHNIQUE: Multiplanar, multiecho pulse sequences of the brain and surrounding structures were obtained without intravenous contrast. COMPARISON:  CT and CTA earlier today. FINDINGS: Brain: DWI hyperintensity with isointensity on ADC map (shine through) at the medial right thalamus. Even more subtle diffusion hyperintensity along the parasagittal right frontal lobe involving a limited area. Given this pattern, seizure phenomenon is favored. CT perfusion findings may be related to increased flow on the right rather than decreased on the left. No history of fever to imply an encephalitis. There is bilateral medial temporal volume loss with no evidence of temporal or insular edema to imply herpes. Small remote bilateral cerebellar infarcts. Chronic microvascular ischemia in the cerebral white matter. There is cerebral volume loss most notable in the medial temporal lobes in  this patient  with history of dementia. No acute hemorrhage, mass, or collection. Vascular: Major flow voids are preserved. Basilar and right PCA were widely patent on prior CTA Skull and upper cervical spine: Negative for marrow lesion Sinuses/Orbits: Retention cysts in the left maxillary sinus. Right cataract resection. Other: Significantly and progressively motion degraded study which could easily obscure findings. IMPRESSION: 1. Edematous signal in the medial right thalamus and possibly in the parasagittal right frontal lobe, favor seizure phenomenon over ischemia. Please ensure no clinical signs of CNS infection. 2. Atrophy in keeping with history of dementia. There is mild chronic small vessel ischemia in the cerebral white matter. Small remote cerebellar infarcts. 3. Significantly motion degraded study. Electronically Signed   By: Monte Fantasia M.D.   On: 10/16/2018 04:21   Ct Code Stroke Cerebral Perfusion With Contrast  Result Date: 10/15/2018 CLINICAL DATA:  Code stroke. Last seen normal at 6 o'clock p.m. last night. Aphasia. Left-sided weakness. EXAM: CT ANGIOGRAPHY HEAD AND NECK CT PERFUSION BRAIN TECHNIQUE: Multidetector CT imaging of the head and neck was performed using the standard protocol during bolus administration of intravenous contrast. Multiplanar CT image reconstructions and MIPs were obtained to evaluate the vascular anatomy. Carotid stenosis measurements (when applicable) are obtained utilizing NASCET criteria, using the distal internal carotid diameter as the denominator. Multiphase CT imaging of the brain was performed following IV bolus contrast injection. Subsequent parametric perfusion maps were calculated using RAPID software. CONTRAST:  118mL OMNIPAQUE IOHEXOL 350 MG/ML SOLN COMPARISON:  CT head without contrast 10/15/2018. FINDINGS: CTA NECK FINDINGS Aortic arch: Extensive vascular calcifications are present at the aortic arch and origins of the great vessels. A 3 vessel arch configuration  is present. There dense calcifications at the origin of the left common carotid artery with greater than 60% stenosis proximally. There is no significant stenosis at the origin of the innominate or of the left subclavian artery. There is no aneurysm. Right carotid system: Right common carotid artery demonstrates some proximal calcifications from the innominate. There distal mural calcifications proximal bifurcation. Extensive calcified plaque is present at the right carotid bifurcation without a significant stenosis of greater than 50% relative to the more distal vessel. The more distal right ICA is normal. Left carotid system: The left common carotid artery demonstrates the proximal stenosis. Additional mural calcifications are present without other tandem stenoses. Extensive vascular calcifications are present at the left carotid bifurcation without a significant stenosis of greater than 50% relative to the more distal vessel. The more distal left ICA is normal. Vertebral arteries: The vertebral arteries are codominant. Calcifications are present in the subclavian arteries bilaterally without significant vertebral artery stenoses. There is no significant vertebral artery stenosis or vascular injury in the neck. Skeleton: Endplate degenerative changes and uncovertebral spurring is worse right than left at C5-6 and C6-7. Extensive facet changes are also worse on the right, more in the upper cervical spine. No focal lytic or blastic lesions are present. The patient is status post median sternotomy. Vertebral body heights are maintained. Calvarium is intact. Other neck: No focal mucosal or submucosal lesions are present. Thyroid and salivary glands are normal. There is no significant adenopathy. Upper chest: Centrilobular emphysematous changes are present. There is no focal nodule, mass, or airspace disease. Review of the MIP images confirms the above findings CTA HEAD FINDINGS Anterior circulation: Posterior  circulation: Atherosclerotic calcifications are present within the cavernous internal carotid arteries bilaterally. There is no significant stenosis of either vessel relative to the ICA terminus. The  A1 and M1 segments are normal. The anterior communicating artery is patent. MCA bifurcations are intact bilaterally. There is asymmetric attenuation of distal left MCA branch vessels compared to the right. No significant proximal stenosis or occlusion is present. Mild distal irregularity is present in the ACA branches bilaterally. Venous sinuses: Atherosclerotic calcifications are present at the dural margin of both vertebral arteries. There is a moderate left V3 segment stenosis. Vertebrobasilar junction is normal. PICA origins are visualized and normal. Basilar artery is normal. Both posterior cerebral arteries originate from the basilar tip. There is some attenuation of distal PCA branch vessels without significant proximal stenosis or occlusion. Anatomic variants: None Delayed phase: Review of the MIP images confirms the above findings CT Brain Perfusion Findings: ASPECTS: 10/10 CBF (<30%) Volume: 67mL Perfusion (Tmax>6.0s) volume: 36mL Mismatch Volume: 26mL Infarction Location:There is asymmetric ischemia and watershed distribution over the left convexity. No infarct is present. IMPRESSION: 1. Asymmetric decreased perfusion to the left hemisphere evident by decreased cerebral blood flow in a watershed distribution and decreased collateral vessels visible on the CTA. 2. The most significant proximal stenosis on the left is in the proximal left common carotid artery near the origin. There is a stenosis of at least 60%. 3. Extensive atherosclerotic changes are present at the left carotid bifurcation and left cavernous internal carotid artery without a significant stenosis of greater than 50%. 4. No large vessel occlusion. 5. Moderate left vertebral artery stenosis at the V3 segment. 6. Other significant atherosclerotic  calcifications as described without other significant stenoses. 7. Moderate degenerative changes in the cervical spine. These results were called by telephone at the time of interpretation on 10/15/2018 at 9:54 am to Dr. Kerney Elbe , who verbally acknowledged these results. Electronically Signed   By: San Morelle M.D.   On: 10/15/2018 10:03   Dg Chest Portable 1 View  Result Date: 10/15/2018 CLINICAL DATA:  Left-sided weakness vomiting.  Possible aspiration. EXAM: PORTABLE CHEST 1 VIEW COMPARISON:  Radiographs 05/05/2016 and 04/24/2016 FINDINGS: 1032 hours. The heart size and mediastinal contours are stable status post median sternotomy and CABG. There is aortic atherosclerosis. The lungs are clear. There is no pleural effusion or pneumothorax. No acute osseous findings are evident. Prominent bilateral breast capsular calcifications are noted. Telemetry leads overlie the chest. IMPRESSION: Stable chest.  No active cardiopulmonary process. Electronically Signed   By: Richardean Sale M.D.   On: 10/15/2018 10:45   Ct Head Code Stroke Wo Contrast  Result Date: 10/15/2018 CLINICAL DATA:  Code stroke.  Left-sided paralysis and aphasia. EXAM: CT HEAD WITHOUT CONTRAST TECHNIQUE: Contiguous axial images were obtained from the base of the skull through the vertex without intravenous contrast. 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 Chronic small-vessel ischemic changes of the pons. Generalized age related atrophy. Chronic small-vessel ischemic changes of the hemispheric white matter. No sign of acute cortical infarction. No mass lesion, hemorrhage, hydrocephalus or extra-axial collection. Vascular: There is atherosclerotic calcification of the major vessels at the base of the brain. Skull: Negative Sinuses/Orbits: Clear/normal Other: None ASPECTS ([place] Stroke Program Early CT Score) - Ganglionic level infarction (caudate, lentiform nuclei, internal  capsule, insula, M1-M3 cortex): 7 - Supraganglionic infarction (M4-M6 cortex): 3 Total score (0-10 with 10 being normal): 10 IMPRESSION: 1. No acute finding by CT. Atrophy and chronic small-vessel ischemic changes. 2. ASPECTS is 10. 3. These results were communicated to Dr. Cheral Marker at [facility] 5/26/2020by text page via the [facility] messaging system. Electronically Signed  By: Nelson Chimes M.D.   On: 10/15/2018 09:23      Subjective: Patient seen and examined at bedside this morning, continues to be unresponsive to verbal command.  Continues on continuous EEG.  Does open eyes sporadically.  No significant change per nursing staff overnight.    Discussed with patient's son [name] over the telephone today, wishes to transition to hospice today.  [name] was very Patent attorney of all hospital staff efforts.   Discharge Exam: Vitals:   10/18/18 0405 10/18/18 0740  BP: (!) 157/97 (!) 161/64  Pulse: 69 62  Resp: 14 16  Temp: (!) 97.5 F (36.4 C) 98.1 F (36.7 C)  SpO2: 100% 98%   Vitals:   10/17/18 2004 10/17/18 2349 10/18/18 0405 10/18/18 0740  BP: (!) 158/78 (!) 155/62 (!) 157/97 (!) 161/64  Pulse: 77 60 69 62  Resp: 16 18 14 16   Temp: 97.6 F (36.4 C) 97.8 F (36.6 C) (!) 97.5 F (36.4 C) 98.1 F (36.7 C)  TempSrc: Oral Oral Oral Oral  SpO2: 98% 97% 100% 98%  Weight:      Height:        General: Unresponsive, no acute distress Cardiovascular: Irregularly irregular rhythm, normal rate, S1/S2 +, no rubs, no gallops Respiratory: CTA bilaterally, no wheezing, no rhonchi Abdominal: Soft, NT, ND, bowel sounds + Extremities: no edema, no cyanosis    The results of significant diagnostics from this hospitalization (including imaging, microbiology, ancillary and laboratory) are listed below for reference.     Microbiology: Recent Results (from the past 240 hour(s))  SARS Coronavirus 2 (CEPHEID- Performed in [place] hospital lab), Hosp Order     Status: None   Collection Time:  10/15/18  9:42 AM  Result Value Ref Range Status   SARS Coronavirus 2 NEGATIVE NEGATIVE Final    Comment: (NOTE) If result is NEGATIVE SARS-CoV-2 target nucleic acids are NOT DETECTED. The SARS-CoV-2 RNA is generally detectable in upper and lower  respiratory specimens during the acute phase of infection. The lowest  concentration of SARS-CoV-2 viral copies this assay can detect is 250  copies / mL. A negative result does not preclude SARS-CoV-2 infection  and should not be used as the sole basis for treatment or other  patient management decisions.  A negative result may occur with  improper specimen collection / handling, submission of specimen other  than nasopharyngeal swab, presence of viral mutation(s) within the  areas targeted by this assay, and inadequate number of viral copies  (<250 copies / mL). A negative result must be combined with clinical  observations, patient history, and epidemiological information. If result is POSITIVE SARS-CoV-2 target nucleic acids are DETECTED. The SARS-CoV-2 RNA is generally detectable in upper and lower  respiratory specimens dur ing the acute phase of infection.  Positive  results are indicative of active infection with SARS-CoV-2.  Clinical  correlation with patient history and other diagnostic information is  necessary to determine patient infection status.  Positive results do  not rule out bacterial infection or co-infection with other viruses. If result is PRESUMPTIVE POSTIVE SARS-CoV-2 nucleic acids MAY BE PRESENT.   A presumptive positive result was obtained on the submitted specimen  and confirmed on repeat testing.  While 2019 novel coronavirus  (SARS-CoV-2) nucleic acids may be present in the submitted sample  additional confirmatory testing may be necessary for epidemiological  and / or clinical management purposes  to differentiate between  SARS-CoV-2 and other Sarbecovirus currently known to infect humans.  If  clinically  indicated additional testing with an alternate test  methodology [phone]) is advised. The SARS-CoV-2 RNA is generally  detectable in upper and lower respiratory sp ecimens during the acute  phase of infection. The expected result is Negative. Fact Sheet for Patients:  StrictlyIdeas.no Fact Sheet for Healthcare Providers: BankingDealers.co.za This test is not yet approved or cleared by the [place] FDA and has been authorized for detection and/or diagnosis of SARS-CoV-2 by FDA under an Emergency Use Authorization (EUA).  This EUA will remain in effect (meaning this test can be used) for the duration of the COVID-19 declaration under Section 564(b)(1) of the Act, 21 U.S.C. section 360bbb-3(b)(1), unless the authorization is terminated or revoked sooner. Performed at [facility], [place] [address] [place], [place] [address]   Blood culture (routine x 2)     Status: None (Preliminary result)   Collection Time: 10/15/18 11:58 AM  Result Value Ref Range Status   Specimen Description BLOOD LEFT ANTECUBITAL  Final   Special Requests   Final    BOTTLES DRAWN AEROBIC AND ANAEROBIC Blood Culture adequate volume   Culture   Final    NO GROWTH 2 DAYS Performed at [facility], [place] [address] [place], [place] [address]    Report Status PENDING  Incomplete  Blood culture (routine x 2)     Status: None (Preliminary result)   Collection Time: 10/15/18  2:37 PM  Result Value Ref Range Status   Specimen Description BLOOD RIGHT ANTECUBITAL  Final   Special Requests   Final    BOTTLES DRAWN AEROBIC ONLY Blood Culture adequate volume   Culture   Final    NO GROWTH 2 DAYS Performed at [facility], [place] [address], [place],  [address]    Report Status PENDING  Incomplete     Labs: BNP (last 3 results) No results for input(s): BNP in the last 8760 hours. Basic Metabolic Panel: Recent Labs  Lab 10/15/18 0903  10/15/18 0913 10/15/18 1029 10/15/18 1437 10/17/18 0943  NA 138 137 138  --  141  K 3.3* 3.2* 3.1*  --  3.2*  CL 98 100  --   --  101  CO2 26  --   --   --  31  GLUCOSE 150* 152*  --   --  81  BUN 12 14  --   --  22  CREATININE 1.12* 1.00  --   --  0.92  CALCIUM 9.9  --   --   --  8.9  MG  --   --   --  1.9 2.0   Liver Function Tests: Recent Labs  Lab 10/15/18 0903  AST 36  ALT 20  ALKPHOS 94  BILITOT 1.2  PROT 7.5  ALBUMIN 4.3   No results for input(s): LIPASE, AMYLASE in the last 168 hours. Recent Labs  Lab 10/16/18 1857  AMMONIA 29   CBC: Recent Labs  Lab 10/15/18 0903 10/15/18 0913 10/15/18 1029 10/17/18 0440  WBC 14.8*  --   --  12.0*  NEUTROABS 13.4*  --   --   --   HGB 14.8 15.3* 15.6* 17.2*  HCT 44.4 45.0 46.0 50.1*  MCV 87.2  --   --  84.6  PLT 332  --   --  192   Cardiac Enzymes: Recent Labs  Lab 10/17/18 0943  CKTOTAL 241*   BNP: Invalid input(s): POCBNP CBG: Recent Labs  Lab 10/15/18 0905 10/15/18 1703  GLUCAP 143* 143*   D-Dimer No results for input(s): DDIMER in the last 72 hours. Hgb A1c Recent Labs    10/16/18 0625  HGBA1C 6.0*   Lipid Profile Recent Labs    10/16/18 0625  CHOL 146  HDL 61  LDLCALC 66  TRIG 97  CHOLHDL 2.4   Thyroid function studies Recent Labs    10/15/18 1437  TSH 0.600   Anemia work up No results for input(s): VITAMINB12, FOLATE, FERRITIN, TIBC, IRON, RETICCTPCT in the last 72 hours. Urinalysis    Component Value Date/Time   COLORURINE YELLOW 01/31/2016 1233   APPEARANCEUR CLEAR 01/31/2016 1233   LABSPEC 1.015 01/31/2016 1233   PHURINE 6.5 01/31/2016 1233   GLUCOSEU NEGATIVE 01/31/2016 1233   HGBUR NEGATIVE 01/31/2016 1233   BILIRUBINUR NEGATIVE 01/31/2016 1233   KETONESUR NEGATIVE 01/31/2016 1233   PROTEINUR NEGATIVE 01/31/2016 1233   UROBILINOGEN 1.0 05/23/2012 0257   NITRITE NEGATIVE 01/31/2016 1233   LEUKOCYTESUR NEGATIVE 01/31/2016 1233   Sepsis Labs Invalid input(s):  PROCALCITONIN,  WBC,  LACTICIDVEN Microbiology Recent Results (from the past 240 hour(s))  SARS Coronavirus 2 (CEPHEID- Performed in [place] hospital lab), Hosp Order     Status: None   Collection Time: 10/15/18  9:42 AM  Result Value Ref Range Status   SARS Coronavirus 2 NEGATIVE NEGATIVE Final    Comment: (NOTE) If result is NEGATIVE SARS-CoV-2 target nucleic acids are NOT DETECTED. The SARS-CoV-2 RNA is generally detectable in upper and lower  respiratory specimens during the acute phase of infection. The lowest  concentration of SARS-CoV-2 viral copies this assay can detect is 250  copies / mL. A negative result does not preclude SARS-CoV-2 infection  and should not be used as the sole basis for treatment or other  patient management decisions.  A negative result may occur with  improper specimen collection / handling, submission of specimen other  than nasopharyngeal swab, presence of viral mutation(s) within the  areas targeted by this assay, and inadequate number of viral copies  (<250 copies / mL). A negative result must be combined with clinical  observations, patient history, and epidemiological information. If result is POSITIVE SARS-CoV-2 target nucleic acids are DETECTED. The SARS-CoV-2 RNA is generally detectable in upper and lower  respiratory specimens dur ing the acute phase of infection.  Positive  results are indicative of active infection with SARS-CoV-2.  Clinical  correlation with patient history and other diagnostic information is  necessary to determine patient infection status.  Positive results do  not rule out bacterial infection or co-infection with other viruses. If result is PRESUMPTIVE POSTIVE SARS-CoV-2 nucleic acids MAY BE PRESENT.   A presumptive positive result was obtained on the submitted specimen  and confirmed on repeat testing.  While 2019 novel coronavirus  (SARS-CoV-2) nucleic acids may be present in the submitted sample  additional  confirmatory testing may be necessary for epidemiological  and / or clinical management purposes  to differentiate between  SARS-CoV-2 and other Sarbecovirus currently known to infect humans.  If clinically indicated additional testing with an alternate test  methodology [phone]) is advised. The SARS-CoV-2 RNA is generally  detectable in upper and lower respiratory sp ecimens during the acute  phase of infection. The expected result is Negative. Fact Sheet for Patients:  StrictlyIdeas.no Fact Sheet for Healthcare Providers: BankingDealers.co.za This test is not yet approved or cleared by the [place] FDA and has been authorized for detection and/or diagnosis of SARS-CoV-2 by FDA under an Emergency Use  Authorization (EUA).  This EUA will remain in effect (meaning this test can be used) for the duration of the COVID-19 declaration under Section 564(b)(1) of the Act, 21 U.S.C. section 360bbb-3(b)(1), unless the authorization is terminated or revoked sooner. Performed at [facility], [place] [address] [place], [place] [address]   Blood culture (routine x 2)     Status: None (Preliminary result)   Collection Time: 10/15/18 11:58 AM  Result Value Ref Range Status   Specimen Description BLOOD LEFT ANTECUBITAL  Final   Special Requests   Final    BOTTLES DRAWN AEROBIC AND ANAEROBIC Blood Culture adequate volume   Culture   Final    NO GROWTH 2 DAYS Performed at [facility], [place] [address], [place], [place] [address]    Report Status PENDING  Incomplete  Blood culture (routine x 2)     Status: None (Preliminary result)   Collection Time: 10/15/18  2:37 PM  Result Value Ref Range Status   Specimen Description BLOOD RIGHT ANTECUBITAL  Final   Special Requests   Final    BOTTLES DRAWN AEROBIC ONLY Blood Culture adequate volume   Culture   Final    NO GROWTH 2 DAYS Performed at [facility], [place] [address],  [place], [place] [address]    Report Status PENDING  Incomplete     Time coordinating discharge: Over 30 minutes  SIGNED:   Diya Gervasi J [place] DO  [facility] 10/18/2018, 10:23 AM

## 2018-10-18 NOTE — TOC Transition Note (Signed)
Transition of Care [facility]) - CM/SW Discharge Note   Patient Details  Name: FIDELIA CATHERS MRN: [id number] Date of Birth: 1927/01/02  [facility] Care [facility]) CM/SW Contact:  Geralynn Ochs, LCSW Phone Number: 10/18/2018, 10:59 AM   Clinical Narrative:   Nurse to call report to [phone]    Final next level of care: [place] Barriers to Discharge: Barriers Resolved   Patient Goals and CMS Choice        Discharge Placement              Patient chooses bed at: [facility]) Patient to be transferred to facility by: [place] Name of family member notified: [name] Patient and family notified of of transfer: 10/18/18  Discharge Plan and Services                                     Social Determinants of Health (SDOH) Interventions     Readmission Risk Interventions No flowsheet data found.

## 2018-10-18 NOTE — Progress Notes (Signed)
Speech Language Pathology Discharge Patient Details Name: [name] MRN: [id number] DOB: 07-Jul-1926 Today's Date: 10/18/2018 Time:  -     Patient discharged from SLP services secondary to medical decline - will need to re-order SLP to resume therapy services.  Please see latest therapy progress note for current level of functioning and progress toward goals.    Progress and discharge plan discussed with patient and/or caregiver: Patient/Caregiver agrees with plan. Please see note from Palliative NP. Son in agreement with POC and does not wish for further testing. Plan is for patient to discharge to [facility] on hospice care with comfort measures.    Sonia Baller, MA, CCC-SLP Speech Therapy [facility] Acute Rehab Pager: [phone]

## 2018-10-18 NOTE — Progress Notes (Signed)
LTM EEG report from yesterday documents background slowing c/w mild to moderate encephalopathy and right hemispheric PLEDs suggestive of significant cortical irritability and neuronal dysfunction in that region. Also noted were electrographic seizures involving right frontal region at times accompanied by subtle clinical manifestations.  This finding suggestive of significant neuronal dysfunction and cortical irritability in the right hemisphere particular right frontal cortex.    The patient's son, per more recent note, has stated that "he is ready to stop all testing, that his mother would not want this.  He spoke with the pts PCP today who advised him to advocate for moving towards comfort measures."  LTM EEG will be discontinued. Would continue current anticonvulsants at discharge at the current doses (Vimpat 100 mg BID, Keppra 1000 mg BID, valproic acid 5 mg/kg TID).   If patient's son changes his initial decision and wishes a full level of care, please call Neurology for re-initiation of LTM EEG, which may be used to further titrate the patient's anticonvulsants.   Electronically signed: Dr. Kerney Elbe

## 2018-10-18 NOTE — Progress Notes (Signed)
LTM EEG discontinued - no skin breakdown at unhook.   

## 2018-10-18 NOTE — Progress Notes (Signed)
Palliative:  I have adjusted Ms. Sotero's medications to ensure comfort. I spoke with daughter, Nevin Bloodgood, and updated on her mother's condition. She agrees with her brother with comfort and transition to hospice which should happen today. She was appreciative of the care her mother has received. Discussed comfort care and expectations. All questions/concerns addressed. Emotional support provided.   15 min  Vinie Sill, NP Palliative Medicine Team Pager # [phone] (M-F 8a-5p) Team Phone # [phone] (Nights/Weekends)

## 2018-10-18 NOTE — Procedures (Signed)
  Electroencephalogram report- LTM with VIDEO  Ordering Physician : inpatient neurology   Beginning time: 10/17/18 at 1045 Ending time:  10/18/18 at 0730 CPT/type : 95720  Day of study: day 2   Technical Description: The EEG was performed using standard setting per the guidelines of American Clinical Neurophysiology Society (ACNS).    A minimum of 21 electrodes were placed on scalp according to the International 10-20 or/and 10-10 Systems. Supplemental electrodes were placed as needed. Single EKG electrode was also used to detect cardiac arrhythmia. Patient's behavior was continuously recorded on video simultaneously with EEG. A minimum of 16 channels were used for data display. Each epoch of study was reviewed manually daily and as needed using standard referential and bipolar montages. Computerized quantitative EEG analysis (such as compressed spectral array analysis, trending, automated spike & seizure detection) were used as indicated.    Spike detection: ON  Seizure detection: ON   This continuous  EEG monitoring with simultaneous video monitoring was performed for this patient with convulsions to rule out clinical and subclinical seizures.   Medications: As per EMR   Day 1 Background activities marked by continuous, reactive background activities with state changes ranging between 3 to 6 cps. More prominent slowing present across right hemisphere particular in anterior right hemispheric cortex.  Superimposed continuous right hemispheric periodic lateralized epileptiform discharges present with frequency 1/s and maximum negativity in the right frontocentral cortex.  Negative field was  broad extending to the left anterior cortex and midline.    At times right periodic lateralized epileptiform discharges marked by initial polyspike and wave discharges complex followed by rhythmic synchronized evolving runs of spike and wave discharges in short trains lasting between 4 to 6 seconds.  With  longer runs patient had  left facial dystonia and a rhythmic facial movements particularly lower face possibly suggestive of some clinical motor manifestations of this electrographic seizures.  Although clinical manifestations were somewhat difficult to discern on suboptimal video and were fairly subtle .   Day 2 : Background activities marked by continuous reactive background activity slowing unchanged from previously recorded.  Superimposed right hemispheric periodic lateralized epileptiform discharges present throughout the recording as well as  brief electrographic seizures arising from right hemisphere particular right frontocentral cortex.  Clinical interpretation: This day 2 of continuous EEG monitoring with simultaneous video monitoring was abnormal and essentially unchanged from previously recorded.  Right hemispheric periodic generalized epileptiform discharges persist as well as  brief electrographic seizures arising from right frontocentral cortex.  Findings suggestive of neuronal dysfunction and significant cortical irritability across the right hemisphere particularly right frontal cortex.  Continues monitoring is recommended to ensure improvement.  Clinical correlation is advised

## 2018-10-18 NOTE — Progress Notes (Signed)
Physical Therapy Discharge Patient Details Name: [name] MRN: [id number] DOB: 10/27/1926 Today's Date: 10/18/2018 Time:  -     Patient discharged from PT services secondary to medical decline - will need to re-order PT to resume therapy services.  Please see latest therapy progress note for current level of functioning and progress toward goals.    Progress and discharge plan discussed with patient and/or caregiver: Patient/Caregiver agrees with plan per Palliative Care note plan is for transition to comfort care.  GP     Earney Navy, PTA Acute Rehabilitation Services Pager: [phone] Office: [phone]   10/18/2018, 8:22 AM

## 2018-10-18 NOTE — Progress Notes (Signed)
Manufacturing engineer [facility]) [place]  Necessary paperwork completed by son [name].  [place] is ready for Mrs. Daughdrill.  Please fax d/c summary to:  [phone]  RN staff:  Please call report to [phone] at any time.    Thank you, Venia Carbon RN, BSN, [facility] Liaison

## 2018-10-20 LAB — CULTURE, BLOOD (ROUTINE X 2)
Culture: NO GROWTH
Culture: NO GROWTH
Special Requests: ADEQUATE
Special Requests: ADEQUATE

## 2018-10-21 DEATH — deceased
